# Patient Record
Sex: Male | Born: 1940
Health system: Southern US, Community
[De-identification: ages and names within clinical notes are randomized; demographics above are authoritative.]

## PROBLEM LIST (undated history)

## (undated) DIAGNOSIS — K624 Stenosis of anus and rectum: Secondary | ICD-10-CM

## (undated) DIAGNOSIS — E7800 Pure hypercholesterolemia, unspecified: Secondary | ICD-10-CM

## (undated) DIAGNOSIS — M1990 Unspecified osteoarthritis, unspecified site: Secondary | ICD-10-CM

## (undated) DIAGNOSIS — I219 Acute myocardial infarction, unspecified: Secondary | ICD-10-CM

## (undated) DIAGNOSIS — I10 Essential (primary) hypertension: Secondary | ICD-10-CM

## (undated) DIAGNOSIS — T8859XA Other complications of anesthesia, initial encounter: Secondary | ICD-10-CM

## (undated) HISTORY — PX: HERNIA REPAIR: SHX51

## (undated) HISTORY — DX: Stenosis of anus and rectum: K62.4

## (undated) HISTORY — DX: Other complications of anesthesia, initial encounter: T88.59XA

## (undated) HISTORY — PX: CORONARY ARTERY BYPASS GRAFT: SHX141

---

## 2003-08-01 ENCOUNTER — Other Ambulatory Visit: Admission: RE | Admit: 2003-08-01 | Discharge: 2003-08-01 | Payer: Self-pay | Admitting: Dermatology

## 2005-03-24 ENCOUNTER — Encounter (HOSPITAL_COMMUNITY): Admission: RE | Admit: 2005-03-24 | Discharge: 2005-04-23 | Payer: Self-pay | Admitting: Family Medicine

## 2006-10-18 DIAGNOSIS — K624 Stenosis of anus and rectum: Secondary | ICD-10-CM

## 2006-10-18 HISTORY — DX: Stenosis of anus and rectum: K62.4

## 2007-11-06 ENCOUNTER — Ambulatory Visit: Payer: Self-pay | Admitting: Orthopedic Surgery

## 2007-11-06 DIAGNOSIS — M23302 Other meniscus derangements, unspecified lateral meniscus, unspecified knee: Secondary | ICD-10-CM | POA: Insufficient documentation

## 2007-11-06 DIAGNOSIS — M1710 Unilateral primary osteoarthritis, unspecified knee: Secondary | ICD-10-CM

## 2007-11-06 DIAGNOSIS — IMO0002 Reserved for concepts with insufficient information to code with codable children: Secondary | ICD-10-CM | POA: Insufficient documentation

## 2009-07-24 ENCOUNTER — Ambulatory Visit (HOSPITAL_COMMUNITY): Admission: RE | Admit: 2009-07-24 | Discharge: 2009-07-24 | Payer: Self-pay | Admitting: Internal Medicine

## 2011-03-05 NOTE — Procedures (Signed)
NAMEVAUGHN, BEAUMIER            ACCOUNT NO.:  [id number]   MEDICAL RECORD NO.:  [id number]          PATIENT TYPE:  REC   LOCATION:                                FACILITY:  [facility]   PHYSICIAN:  [name] A. Gerda Diss, MD    DATE OF BIRTH:  13-Dec-1940   DATE OF PROCEDURE:  DATE OF DISCHARGE:                                    STRESS TEST   STRESS CARDIOLITE   INDICATIONS:  Coronary artery disease, DOE.   RESTING EKG:  No acute ST segment changes are noted.  No sign of ischemia on  resting EKG.   PROTOCOL:  Bruce protocol.   ST segment response during exercise:  Patient had a normal ST segment  response during exercise.  No significant deviations of elevation or  depression.   Arrhythmias:  None.   Blood pressure response to exercise:  Patient had mild hypertensive response  toward last phase of exercise.   Recovery phase uneventful.   Interpretation of test:  Normal stress portion.  Await Cardiolite images.       SAL/MEDQ  D:  03/24/2005  T:  03/24/2005  Job:  [id number]

## 2012-07-31 ENCOUNTER — Encounter (HOSPITAL_COMMUNITY): Payer: Self-pay | Admitting: Emergency Medicine

## 2012-07-31 ENCOUNTER — Emergency Department (HOSPITAL_COMMUNITY): Payer: Medicare Other

## 2012-07-31 ENCOUNTER — Emergency Department (HOSPITAL_COMMUNITY)
Admission: EM | Admit: 2012-07-31 | Discharge: 2012-07-31 | Disposition: A | Discharge status: Home / Self Care | Payer: Medicare Other | Attending: Emergency Medicine | Admitting: Emergency Medicine

## 2012-07-31 DIAGNOSIS — I10 Essential (primary) hypertension: Secondary | ICD-10-CM | POA: Insufficient documentation

## 2012-07-31 DIAGNOSIS — B349 Viral infection, unspecified: Secondary | ICD-10-CM

## 2012-07-31 DIAGNOSIS — R509 Fever, unspecified: Secondary | ICD-10-CM | POA: Insufficient documentation

## 2012-07-31 DIAGNOSIS — B9789 Other viral agents as the cause of diseases classified elsewhere: Secondary | ICD-10-CM | POA: Insufficient documentation

## 2012-07-31 DIAGNOSIS — M542 Cervicalgia: Secondary | ICD-10-CM | POA: Insufficient documentation

## 2012-07-31 DIAGNOSIS — R079 Chest pain, unspecified: Secondary | ICD-10-CM | POA: Insufficient documentation

## 2012-07-31 DIAGNOSIS — I252 Old myocardial infarction: Secondary | ICD-10-CM | POA: Insufficient documentation

## 2012-07-31 DIAGNOSIS — R51 Headache: Secondary | ICD-10-CM | POA: Insufficient documentation

## 2012-07-31 HISTORY — DX: Acute myocardial infarction, unspecified: I21.9

## 2012-07-31 HISTORY — DX: Unspecified osteoarthritis, unspecified site: M19.90

## 2012-07-31 HISTORY — DX: Essential (primary) hypertension: I10

## 2012-07-31 HISTORY — DX: Pure hypercholesterolemia, unspecified: E78.00

## 2012-07-31 LAB — CBC WITH DIFFERENTIAL/PLATELET
Basophils Relative: 0 % (ref 0–1)
Eosinophils Absolute: 0.3 10*3/uL (ref 0.0–0.7)
Eosinophils Relative: 3 % (ref 0–5)
HCT: 39.7 % (ref 39.0–52.0)
Lymphocytes Relative: 13 % (ref 12–46)
MCH: 27.5 pg (ref 26.0–34.0)
MCHC: 33 g/dL (ref 30.0–36.0)
MCV: 83.2 fL (ref 78.0–100.0)
RBC: 4.77 MIL/uL (ref 4.22–5.81)

## 2012-07-31 LAB — BASIC METABOLIC PANEL
CO2: 25 mEq/L (ref 19–32)
Calcium: 9.3 mg/dL (ref 8.4–10.5)
Creatinine, Ser: 0.61 mg/dL (ref 0.50–1.35)
GFR calc Af Amer: >90 mL/min (ref >90)
Potassium: 3.6 mEq/L (ref 3.5–5.1)

## 2012-07-31 LAB — HEPATIC FUNCTION PANEL
AST: 23 U/L (ref 0–37)
Alkaline Phosphatase: 68 U/L (ref 39–117)
Indirect Bilirubin: 0.3 mg/dL (ref 0.3–0.9)
Total Protein: 7.7 g/dL (ref 6.0–8.3)

## 2012-07-31 MED ORDER — DOXYCYCLINE HYCLATE 100 MG PO CAPS: 100.0000 mg | ORAL_CAPSULE | Freq: Two times a day (BID) | ORAL | Status: DC

## 2012-07-31 MED ORDER — KETOROLAC TROMETHAMINE 30 MG/ML IJ SOLN
30.0000 mg | Freq: Once | INTRAMUSCULAR | Status: AC
  Administered 2012-07-31: 30 mg via INTRAVENOUS
  Filled 2012-07-31: qty 1

## 2012-07-31 MED ORDER — SODIUM CHLORIDE 0.9 % IV BOLUS (SEPSIS)
1000.0000 mL | Freq: Once | INTRAVENOUS | Status: AC
  Administered 2012-07-31: 1000 mL via INTRAVENOUS

## 2012-07-31 NOTE — ED Notes (Signed)
Pt c/o headache all day yesterday, fever, body aches and neck pain started last night. Denies neck feeling stiff but painful when he bends it. Denies n/v/d/cough/sob.

## 2012-07-31 NOTE — ED Provider Notes (Signed)
History   This chart was scribed for Benny Lennert, MD by Sofie Rower. The patient was seen in room APA10/APA10 and the patient's care was started at 5:16PM.     CSN: [id number]  Arrival date & time 07/31/12  1634   First MD Initiated Contact with Patient 07/31/12 1716      Chief Complaint  Patient presents with  . Headache  . Neck Pain  . Fever    (Consider location/radiation/quality/duration/timing/severity/associated sxs/prior treatment) Patient is a 71 y.o. male presenting with headaches and neck pain. The history is provided by the patient. No language interpreter was used.  Headache  This is a new problem. The current episode started yesterday. The problem occurs constantly. The problem has been gradually worsening. The headache is associated with an unknown factor. The pain is moderate. The pain does not radiate. He has tried nothing for the symptoms. The treatment provided no relief.  Neck Pain  This is a new problem. The current episode started yesterday. The problem occurs constantly. The problem has not changed since onset.The pain is associated with an unknown factor. There has been no fever. The pain is present in the generalized neck. The pain is mild. The symptoms are aggravated by bending. The pain is the same all the time. He has tried nothing for the symptoms. The treatment provided no relief.    Past Medical History  Diagnosis Date  . Myocardial infarct   . Hypercholesteremia   . Hypertension   . Arthritis     Past Surgical History  Procedure Date  . Coronary artery bypass graft   . Hernia repair     History reviewed. No pertinent family history.  History  Substance Use Topics  . Smoking status: Former Games developer  . Smokeless tobacco: Not on file  . Alcohol Use: No      Review of Systems  HENT: Positive for neck pain.   All other systems reviewed and are negative.    Allergies  Review of patient's allergies indicates no known allergies.  Home  Medications  No current outpatient prescriptions on file.  BP 172/66  Pulse 73  Temp 98.6 F (37 C) (Oral)  Resp 20  Ht 5\' 8"  (1.727 m)  Wt 146 lb (66.225 kg)  BMI 22.20 kg/m2  SpO2 98%  Physical Exam  Nursing note and vitals reviewed. Constitutional: He is oriented to person, place, and time. He appears well-developed.  HENT:  Head: Normocephalic and atraumatic.  Eyes: Conjunctivae normal and EOM are normal. No scleral icterus.  Neck: Neck supple. No thyromegaly present.  Cardiovascular: Normal rate and regular rhythm.  Exam reveals no gallop and no friction rub.   No murmur heard. Pulmonary/Chest: No stridor. He has no wheezes. He has no rales. He exhibits no tenderness.  Abdominal: He exhibits no distension. There is no tenderness. There is no rebound.  Musculoskeletal: Normal range of motion. He exhibits no edema.  Lymphadenopathy:    He has no cervical adenopathy.  Neurological: He is oriented to person, place, and time. Coordination normal.  Skin: No rash noted. No erythema.  Psychiatric: He has a normal mood and affect. His behavior is normal.  neck supple with minimal tenderness  ED Course  Procedures (including critical care time)  DIAGNOSTIC STUDIES: Oxygen Saturation is 98% on room air, normal by my interpretation.    COORDINATION OF CARE:    5:20PM- Treatment plan concerning Blood work, IV fluids, and X-ray discussed with patient. Pt agrees with treatment.  Results for orders placed during the hospital encounter of 07/31/12  CBC WITH DIFFERENTIAL      Component Value Range   WBC 12.1 (*) 4.0 - 10.5 K/uL   RBC 4.77  4.22 - 5.81 MIL/uL   Hemoglobin 13.1  13.0 - 17.0 g/dL   HCT 16.1  09.6 - 04.5 %   MCV 83.2  78.0 - 100.0 fL   MCH 27.5  26.0 - 34.0 pg   MCHC 33.0  30.0 - 36.0 g/dL   RDW 40.9  81.1 - 91.4 %   Platelets 259  150 - 400 K/uL   Neutrophils Relative 78 (*) 43 - 77 %   Neutro Abs 9.4 (*) 1.7 - 7.7 K/uL   Lymphocytes Relative 13  12 - 46 %     Lymphs Abs 1.6  0.7 - 4.0 K/uL   Monocytes Relative 6  3 - 12 %   Monocytes Absolute 0.7  0.1 - 1.0 K/uL   Eosinophils Relative 3  0 - 5 %   Eosinophils Absolute 0.3  0.0 - 0.7 K/uL   Basophils Relative 0  0 - 1 %   Basophils Absolute 0.1  0.0 - 0.1 K/uL  BASIC METABOLIC PANEL      Component Value Range   Sodium 133 (*) 135 - 145 mEq/L   Potassium 3.6  3.5 - 5.1 mEq/L   Chloride 97  96 - 112 mEq/L   CO2 25  19 - 32 mEq/L   Glucose, Bld 111 (*) 70 - 99 mg/dL   BUN 7  6 - 23 mg/dL   Creatinine, Ser 7.82  0.50 - 1.35 mg/dL   Calcium 9.3  8.4 - 95.6 mg/dL   GFR calc non Af Amer >90  >90 mL/min   GFR calc Af Amer >90  >90 mL/min  HEPATIC FUNCTION PANEL      Component Value Range   Total Protein 7.7  6.0 - 8.3 g/dL   Albumin 3.7  3.5 - 5.2 g/dL   AST 23  0 - 37 U/L   ALT 17  0 - 53 U/L   Alkaline Phosphatase 68  39 - 117 U/L   Total Bilirubin 0.4  0.3 - 1.2 mg/dL   Bilirubin, Direct 0.1  0.0 - 0.3 mg/dL   Indirect Bilirubin 0.3  0.3 - 0.9 mg/dL   Dg Chest 2 View  21/30/8657  *RADIOLOGY REPORT*  Clinical Data: Chest pain and neck pain.  Headache.  CHEST - 2 VIEW  Comparison: None.  Findings: Prior CABG.  Heart size and vascularity are normal. Lungs are hyperinflated with accentuation of the interstitial markings at the bases which is probably chronic.  No effusions.  No acute osseous abnormality.  The lateral view there is a vague density in the region of the medial segment right middle lobe adjacent to some surgical clips. I suspect this is an area of scarring.  IMPRESSION: COPD.  No acute abnormality.   Original Report Authenticated By: Gwynn Burly, M.D.    Ct Head Wo Contrast  07/31/2012  *RADIOLOGY REPORT*  Clinical Data: Headache.  Neck pain.  Fever.  CT HEAD WITHOUT CONTRAST  Technique:  Contiguous axial images were obtained from the base of the skull through the vertex without contrast.  Comparison: None.  Findings: There is no intra or extra-axial fluid collection or mass  lesion.  The basilar cisterns and ventricles have a normal appearance.  There is no CT evidence for acute infarction or hemorrhage.  Bone windows  show mucoperiosteal thickening in the ethmoid air cells.  No calvarial fracture.  IMPRESSION:  1. No evidence for acute intracranial abnormality. 2.  Changes of chronic ethmoid sinusitis.   Original Report Authenticated By: Patterson Hammersmith, M.D.       No diagnosis found.   Pt felt much better at discharge after fluids and toradol MDM  Pt with headache and supple neck.  No fever.  Possible viral infection.  Will cover for rocky [place] spotted fever      The chart was scribed for me under my direct supervision.  I personally performed the history, physical, and medical decision making and all procedures in the evaluation of this patient.Benny Lennert, MD 07/31/12 [phone]

## 2012-08-01 ENCOUNTER — Emergency Department (HOSPITAL_COMMUNITY)
Admission: EM | Admit: 2012-08-01 | Discharge: 2012-08-01 | Disposition: A | Discharge status: Home / Self Care | Payer: Medicare Other | Attending: Emergency Medicine | Admitting: Emergency Medicine

## 2012-08-01 ENCOUNTER — Encounter (HOSPITAL_COMMUNITY): Payer: Self-pay | Admitting: Emergency Medicine

## 2012-08-01 DIAGNOSIS — B349 Viral infection, unspecified: Secondary | ICD-10-CM

## 2012-08-01 DIAGNOSIS — R509 Fever, unspecified: Secondary | ICD-10-CM | POA: Insufficient documentation

## 2012-08-01 DIAGNOSIS — R51 Headache: Secondary | ICD-10-CM | POA: Insufficient documentation

## 2012-08-01 DIAGNOSIS — Z951 Presence of aortocoronary bypass graft: Secondary | ICD-10-CM | POA: Insufficient documentation

## 2012-08-01 DIAGNOSIS — M129 Arthropathy, unspecified: Secondary | ICD-10-CM | POA: Insufficient documentation

## 2012-08-01 DIAGNOSIS — I252 Old myocardial infarction: Secondary | ICD-10-CM | POA: Insufficient documentation

## 2012-08-01 DIAGNOSIS — Z7982 Long term (current) use of aspirin: Secondary | ICD-10-CM | POA: Insufficient documentation

## 2012-08-01 DIAGNOSIS — I10 Essential (primary) hypertension: Secondary | ICD-10-CM | POA: Insufficient documentation

## 2012-08-01 DIAGNOSIS — M549 Dorsalgia, unspecified: Secondary | ICD-10-CM | POA: Insufficient documentation

## 2012-08-01 DIAGNOSIS — E7800 Pure hypercholesterolemia, unspecified: Secondary | ICD-10-CM | POA: Insufficient documentation

## 2012-08-01 DIAGNOSIS — Z79899 Other long term (current) drug therapy: Secondary | ICD-10-CM | POA: Insufficient documentation

## 2012-08-01 LAB — CBC WITH DIFFERENTIAL/PLATELET
Eosinophils Relative: 2 % (ref 0–5)
HCT: 37.8 % — ABNORMAL LOW (ref 39.0–52.0)
Hemoglobin: 12.4 g/dL — ABNORMAL LOW (ref 13.0–17.0)
Lymphs Abs: 1 10*3/uL (ref 0.7–4.0)
MCV: 82.5 fL (ref 78.0–100.0)
Monocytes Relative: 6 % (ref 3–12)
WBC: 9 10*3/uL (ref 4.0–10.5)

## 2012-08-01 LAB — COMPREHENSIVE METABOLIC PANEL
Albumin: 3.4 g/dL — ABNORMAL LOW (ref 3.5–5.2)
BUN: 9 mg/dL (ref 6–23)
GFR calc Af Amer: >90 mL/min (ref >90)
GFR calc non Af Amer: >90 mL/min (ref >90)
Potassium: 3.6 mEq/L (ref 3.5–5.1)
Sodium: 133 mEq/L — ABNORMAL LOW (ref 135–145)
Total Protein: 7.1 g/dL (ref 6.0–8.3)

## 2012-08-01 LAB — ROCKY MTN SPOTTED FVR AB, IGG-BLOOD: RMSF IgG: 0.09 IV

## 2012-08-01 LAB — URINALYSIS, ROUTINE W REFLEX MICROSCOPIC
Glucose, UA: NEGATIVE mg/dL
Hgb urine dipstick: NEGATIVE
Leukocytes, UA: NEGATIVE
Protein, ur: NEGATIVE mg/dL
pH: 7 (ref 5.0–8.0)

## 2012-08-01 MED ORDER — SODIUM CHLORIDE 0.9 % IV BOLUS (SEPSIS)
1000.0000 mL | Freq: Once | INTRAVENOUS | Status: AC
  Administered 2012-08-01 (×2): 1000 mL via INTRAVENOUS

## 2012-08-01 MED ORDER — DOXYCYCLINE HYCLATE 100 MG PO TABS
100.0000 mg | ORAL_TABLET | Freq: Once | ORAL | Status: AC
  Administered 2012-08-01: 100 mg via ORAL
  Filled 2012-08-01: qty 1

## 2012-08-01 MED ORDER — SODIUM CHLORIDE 0.9 % IV BOLUS (SEPSIS): 1000.0000 mL | Freq: Once | INTRAVENOUS | Status: DC

## 2012-08-01 MED ORDER — DIPHENHYDRAMINE HCL 50 MG/ML IJ SOLN
12.5000 mg | Freq: Once | INTRAMUSCULAR | Status: AC
  Administered 2012-08-01: 12.5 mg via INTRAVENOUS
  Filled 2012-08-01: qty 1

## 2012-08-01 MED ORDER — METOCLOPRAMIDE HCL 5 MG/ML IJ SOLN
10.0000 mg | Freq: Once | INTRAMUSCULAR | Status: AC
  Administered 2012-08-01: 10 mg via INTRAVENOUS
  Filled 2012-08-01: qty 2

## 2012-08-01 MED ORDER — KETOROLAC TROMETHAMINE 30 MG/ML IJ SOLN
30.0000 mg | Freq: Once | INTRAMUSCULAR | Status: AC
  Administered 2012-08-01: 30 mg via INTRAVENOUS
  Filled 2012-08-01: qty 1

## 2012-08-01 NOTE — ED Notes (Addendum)
Pt states he was seen here last night and that he feels worse. States he has a headache, generalized body aches, and pain all over that has gotten worse since last night.

## 2012-08-01 NOTE — ED Notes (Signed)
Pt c/o headache, fever, generalized body aches.  Reports went to PCP yesterday and was sent here for evaluation.  Reports was given doxycyline prescription but hasn't started taking it yet.  Pt says woke up this morning around 2am feeling worse.  Reports nausea, no vomiting.  Pt says has a little bit on neck pain at the base of his skull.  Pt alert and oriented.

## 2012-08-01 NOTE — ED Provider Notes (Signed)
History  This chart was scribed for Donnetta Hutching, MD by Ladona Ridgel Day. This patient was seen in room APA18/APA18 and the patient's care was started at 0722.   CSN: [id number]  Arrival date & time 08/01/12  0630   First MD Initiated Contact with Patient 08/01/12 [phone]      Chief Complaint  Patient presents with  . Headache  . Generalized Body Aches   Patient is a 71 y.o. male presenting with headaches. The history is provided by the patient. No language interpreter was used.  Headache  This is a recurrent problem. The current episode started 2 days ago. The problem occurs constantly. The problem has been gradually worsening. The headache is associated with nothing. The pain is located in the occipital and frontal region. The quality of the pain is described as throbbing. The pain is moderate. The pain does not radiate. Associated symptoms include a fever. Pertinent negatives include no shortness of breath, no nausea and no vomiting.   Jason Spence is a 71 y.o. male who presents to the Emergency Department complaining of constant gradually worsening HA and generalized body aches/fever for the past 2 days. He was seen here yesterday after his PCP Dr. Rich Reining told him to come to ER yesterday for evaluation. He has normal head CT and CXR and D/C with doxycycline. He states has been having generalized body aches, frontal/occipital HA, fever, and lower back pain. He states Dr. Rich Reining may have been worried at meningitis because his neck was sore. He denies any modifying factors to his symptoms.   Past Medical History  Diagnosis Date  . Myocardial infarct   . Hypercholesteremia   . Hypertension   . Arthritis     Past Surgical History  Procedure Date  . Coronary artery bypass graft   . Hernia repair     History reviewed. No pertinent family history.  History  Substance Use Topics  . Smoking status: Former Games developer  . Smokeless tobacco: Not on file  . Alcohol Use: No      Review of  Systems  Constitutional: Positive for fever. Negative for chills.  HENT: Negative for congestion.   Respiratory: Negative for shortness of breath.   Gastrointestinal: Negative for nausea, vomiting, abdominal pain and diarrhea.  Musculoskeletal: Positive for back pain.       Lower back pain  Neurological: Positive for headaches. Negative for weakness.  All other systems reviewed and are negative.    Allergies  Review of patient's allergies indicates no known allergies.  Home Medications   Current Outpatient Rx  Name Route Sig Dispense Refill  . ASPIRIN 81 MG PO TABS Oral Take 81 mg by mouth daily.    Marland Kitchen CEFPROZIL 500 MG PO TABS Oral Take 500 mg by mouth 2 (two) times daily.    Marland Kitchen FEXOFENADINE HCL 30 MG PO TABS Oral Take 30 mg by mouth 2 (two) times daily.    . OMEGA-3 FATTY ACIDS 1000 MG PO CAPS Oral Take 2 g by mouth daily.    Marland Kitchen FLUTICASONE PROPIONATE 50 MCG/ACT NA SUSP Nasal Place 2 sprays into the nose daily.    Marland Kitchen OMEPRAZOLE 20 MG PO CPDR Oral Take 20 mg by mouth daily.    Marland Kitchen PRAVASTATIN SODIUM 40 MG PO TABS Oral Take 40 mg by mouth daily.    Marland Kitchen RAMIPRIL 10 MG PO TABS Oral Take 10 mg by mouth daily.    Marland Kitchen DOXYCYCLINE HYCLATE 100 MG PO CAPS Oral Take 1 capsule (100 mg total)  by mouth 2 (two) times daily. 20 capsule 0    Triage Vitals: BP 149/61  Pulse 64  Temp 98.3 F (36.8 C) (Oral)  Resp 20  Ht 5\' 9"  (1.753 m)  Wt 146 lb (66.225 kg)  BMI 21.56 kg/m2  SpO2 100%  Physical Exam  Nursing note and vitals reviewed. Constitutional: He is oriented to person, place, and time. He appears well-developed and well-nourished.       Pt is alert and non-toxic appearing  HENT:  Head: Normocephalic and atraumatic.       Dry mucous membranes  Eyes: Conjunctivae normal and EOM are normal. Pupils are equal, round, and reactive to light.  Neck: Normal range of motion. Neck supple.       No meningeal signs.   Cardiovascular: Normal rate, regular rhythm and normal heart sounds.     Pulmonary/Chest: Effort normal and breath sounds normal.  Abdominal: Soft. Bowel sounds are normal. He exhibits no distension. There is no tenderness. There is no rebound and no guarding.  Musculoskeletal: Normal range of motion. He exhibits tenderness.       Lower lumbar spine tenderness  Neurological: He is alert and oriented to person, place, and time.  Skin: Skin is warm and dry.  Psychiatric: He has a normal mood and affect.    ED Course  Procedures (including critical care time) DIAGNOSTIC STUDIES: Oxygen Saturation is 100% on room air, normal by my interpretation.    COORDINATION OF CARE: At 730 AM Discussed treatment plan with patient which includes IV fluids, blood work, UA. Patient agrees.   Labs Reviewed - No data to display Dg Chest 2 View  07/31/2012  *RADIOLOGY REPORT*  Clinical Data: Chest pain and neck pain.  Headache.  CHEST - 2 VIEW  Comparison: None.  Findings: Prior CABG.  Heart size and vascularity are normal. Lungs are hyperinflated with accentuation of the interstitial markings at the bases which is probably chronic.  No effusions.  No acute osseous abnormality.  The lateral view there is a vague density in the region of the medial segment right middle lobe adjacent to some surgical clips. I suspect this is an area of scarring.  IMPRESSION: COPD.  No acute abnormality.   Original Report Authenticated By: Gwynn Burly, M.D.    Ct Head Wo Contrast  07/31/2012  *RADIOLOGY REPORT*  Clinical Data: Headache.  Neck pain.  Fever.  CT HEAD WITHOUT CONTRAST  Technique:  Contiguous axial images were obtained from the base of the skull through the vertex without contrast.  Comparison: None.  Findings: There is no intra or extra-axial fluid collection or mass lesion.  The basilar cisterns and ventricles have a normal appearance.  There is no CT evidence for acute infarction or hemorrhage.  Bone windows show mucoperiosteal thickening in the ethmoid air cells.  No calvarial  fracture.  IMPRESSION:  1. No evidence for acute intracranial abnormality. 2.  Changes of chronic ethmoid sinusitis.   Original Report Authenticated By: Patterson Hammersmith, M.D.    Results for orders placed during the hospital encounter of 08/01/12  CBC WITH DIFFERENTIAL      Component Value Range   WBC 9.0  4.0 - 10.5 K/uL   RBC 4.58  4.22 - 5.81 MIL/uL   Hemoglobin 12.4 (*) 13.0 - 17.0 g/dL   HCT 96.0 (*) 45.4 - 09.8 %   MCV 82.5  78.0 - 100.0 fL   MCH 27.1  26.0 - 34.0 pg   MCHC 32.8  30.0 -  36.0 g/dL   RDW 16.1  09.6 - 04.5 %   Platelets 223  150 - 400 K/uL   Neutrophils Relative 81 (*) 43 - 77 %   Neutro Abs 7.3  1.7 - 7.7 K/uL   Lymphocytes Relative 11 (*) 12 - 46 %   Lymphs Abs 1.0  0.7 - 4.0 K/uL   Monocytes Relative 6  3 - 12 %   Monocytes Absolute 0.5  0.1 - 1.0 K/uL   Eosinophils Relative 2  0 - 5 %   Eosinophils Absolute 0.2  0.0 - 0.7 K/uL   Basophils Relative 0  0 - 1 %   Basophils Absolute 0.0  0.0 - 0.1 K/uL  COMPREHENSIVE METABOLIC PANEL      Component Value Range   Sodium 133 (*) 135 - 145 mEq/L   Potassium 3.6  3.5 - 5.1 mEq/L   Chloride 100  96 - 112 mEq/L   CO2 23  19 - 32 mEq/L   Glucose, Bld 112 (*) 70 - 99 mg/dL   BUN 9  6 - 23 mg/dL   Creatinine, Ser 4.09  0.50 - 1.35 mg/dL   Calcium 8.9  8.4 - 81.1 mg/dL   Total Protein 7.1  6.0 - 8.3 g/dL   Albumin 3.4 (*) 3.5 - 5.2 g/dL   AST 21  0 - 37 U/L   ALT 14  0 - 53 U/L   Alkaline Phosphatase 60  39 - 117 U/L   Total Bilirubin 0.5  0.3 - 1.2 mg/dL   GFR calc non Af Amer >90  >90 mL/min   GFR calc Af Amer >90  >90 mL/min  URINALYSIS, ROUTINE W REFLEX MICROSCOPIC      Component Value Range   Color, Urine YELLOW  YELLOW   APPearance CLEAR  CLEAR   Specific Gravity, Urine 1.020  1.005 - 1.030   pH 7.0  5.0 - 8.0   Glucose, UA NEGATIVE  NEGATIVE mg/dL   Hgb urine dipstick NEGATIVE  NEGATIVE   Bilirubin Urine NEGATIVE  NEGATIVE   Ketones, ur 15 (*) NEGATIVE mg/dL   Protein, ur NEGATIVE  NEGATIVE mg/dL     Urobilinogen, UA 0.2  0.0 - 1.0 mg/dL   Nitrite NEGATIVE  NEGATIVE   Leukocytes, UA NEGATIVE  NEGATIVE    No diagnosis found.    MDM   Patient feeling much better after IV fluids, IV Benadryl, Reglan, Toradol. No clinical evidence of meningitis.  White blood cell count 9.0. Urinalysis negative.      I personally performed the services described in this documentation, which was scribed in my presence. The recorded information has been reviewed and considered.          Donnetta Hutching, MD 08/15/12 2202

## 2012-08-02 ENCOUNTER — Other Ambulatory Visit: Payer: Self-pay

## 2012-08-02 ENCOUNTER — Observation Stay (HOSPITAL_COMMUNITY)
Admission: EM | Admit: 2012-08-02 | Discharge: 2012-08-05 | Disposition: A | Discharge status: Home / Self Care | Payer: Medicare Other | Attending: Internal Medicine | Admitting: Internal Medicine

## 2012-08-02 ENCOUNTER — Encounter (HOSPITAL_COMMUNITY): Payer: Self-pay | Admitting: *Deleted

## 2012-08-02 ENCOUNTER — Emergency Department (HOSPITAL_COMMUNITY): Payer: Medicare Other

## 2012-08-02 DIAGNOSIS — R51 Headache: Secondary | ICD-10-CM | POA: Insufficient documentation

## 2012-08-02 DIAGNOSIS — R509 Fever, unspecified: Secondary | ICD-10-CM | POA: Diagnosis present

## 2012-08-02 DIAGNOSIS — K219 Gastro-esophageal reflux disease without esophagitis: Secondary | ICD-10-CM | POA: Insufficient documentation

## 2012-08-02 DIAGNOSIS — M549 Dorsalgia, unspecified: Secondary | ICD-10-CM

## 2012-08-02 DIAGNOSIS — I2510 Atherosclerotic heart disease of native coronary artery without angina pectoris: Secondary | ICD-10-CM | POA: Insufficient documentation

## 2012-08-02 DIAGNOSIS — Z79899 Other long term (current) drug therapy: Secondary | ICD-10-CM | POA: Insufficient documentation

## 2012-08-02 DIAGNOSIS — E785 Hyperlipidemia, unspecified: Secondary | ICD-10-CM | POA: Insufficient documentation

## 2012-08-02 DIAGNOSIS — R291 Meningismus: Principal | ICD-10-CM | POA: Diagnosis present

## 2012-08-02 DIAGNOSIS — R519 Headache, unspecified: Secondary | ICD-10-CM | POA: Diagnosis present

## 2012-08-02 LAB — COMPREHENSIVE METABOLIC PANEL
Alkaline Phosphatase: 62 U/L (ref 39–117)
BUN: 7 mg/dL (ref 6–23)
Calcium: 9 mg/dL (ref 8.4–10.5)
Creatinine, Ser: 0.62 mg/dL (ref 0.50–1.35)
GFR calc Af Amer: >90 mL/min (ref >90)
Glucose, Bld: 112 mg/dL — ABNORMAL HIGH (ref 70–99)
Total Protein: 7.5 g/dL (ref 6.0–8.3)

## 2012-08-02 LAB — CBC WITH DIFFERENTIAL/PLATELET
Eosinophils Absolute: 0.1 10*3/uL (ref 0.0–0.7)
Eosinophils Relative: 1 % (ref 0–5)
HCT: 40.3 % (ref 39.0–52.0)
Lymphocytes Relative: 12 % (ref 12–46)
Monocytes Relative: 8 % (ref 3–12)
Neutro Abs: 7 10*3/uL (ref 1.7–7.7)
Neutrophils Relative %: 79 % — ABNORMAL HIGH (ref 43–77)
Platelets: 223 10*3/uL (ref 150–400)
RDW: 14.3 % (ref 11.5–15.5)
WBC: 8.9 10*3/uL (ref 4.0–10.5)

## 2012-08-02 LAB — URINALYSIS, ROUTINE W REFLEX MICROSCOPIC
Bilirubin Urine: NEGATIVE
Nitrite: NEGATIVE
Specific Gravity, Urine: 1.02 (ref 1.005–1.030)
Urobilinogen, UA: 0.2 mg/dL (ref 0.0–1.0)

## 2012-08-02 LAB — TROPONIN I: Troponin I: <0.3 ng/mL (ref <0.30)

## 2012-08-02 MED ORDER — MORPHINE SULFATE 4 MG/ML IJ SOLN
4.0000 mg | Freq: Once | INTRAMUSCULAR | Status: AC
  Administered 2012-08-02: 4 mg via INTRAVENOUS
  Filled 2012-08-02: qty 1

## 2012-08-02 MED ORDER — SODIUM CHLORIDE 0.9 % IV SOLN: INTRAVENOUS | Status: DC

## 2012-08-02 MED ORDER — ONDANSETRON HCL 4 MG/2ML IJ SOLN
4.0000 mg | Freq: Once | INTRAMUSCULAR | Status: AC
  Administered 2012-08-02: 4 mg via INTRAVENOUS
  Filled 2012-08-02: qty 2

## 2012-08-02 MED ORDER — SODIUM CHLORIDE 0.9 % IV BOLUS (SEPSIS)
1000.0000 mL | Freq: Once | INTRAVENOUS | Status: AC
  Administered 2012-08-02: 1000 mL via INTRAVENOUS

## 2012-08-02 NOTE — ED Notes (Signed)
Consent signed for Lumbar puncture.

## 2012-08-02 NOTE — ED Provider Notes (Addendum)
History     CSN: [id number]  Arrival date & time 08/02/12  1643   First MD Initiated Contact with Patient 08/02/12 1743      Chief Complaint  Patient presents with  . Headache    (Consider location/radiation/quality/duration/timing/severity/associated sxs/prior treatment) HPI Pt presents from PMD's office for ongoing HA since Sunday. Gradual onset. Associated with diffuse myalgias, low back pain, low grade fever and chills. Has been seen x2 at [facility] ED with Ct head that showed chronic ethmoid sinusitis. Pt started on Doxycycline for possible RMSF though reports no history of tick bites, rash. +nausea and photophobia. No focal weakness, sensory changes. Pt reports having flu shot last week and being treated for sinusitis 2 weeks ago. Past Medical History  Diagnosis Date  . Myocardial infarct     History reviewed. No pertinent past surgical history.  No family history on file.  History  Substance Use Topics  . Smoking status: Not on file  . Smokeless tobacco: Not on file  . Alcohol Use: No      Review of Systems  Constitutional: Positive for fever, chills, appetite change and fatigue.  HENT: Negative for congestion, sore throat, rhinorrhea, neck pain, neck stiffness and sinus pressure.   Eyes: Positive for photophobia. Negative for visual disturbance.  Respiratory: Negative for shortness of breath.   Cardiovascular: Negative for chest pain, palpitations and leg swelling.  Gastrointestinal: Positive for nausea. Negative for vomiting, abdominal pain, diarrhea and constipation.  Genitourinary: Negative for dysuria and frequency.  Musculoskeletal: Positive for myalgias, back pain and arthralgias.  Skin: Negative for pallor, rash and wound.  Neurological: Positive for tremors and headaches. Negative for dizziness, seizures, syncope, weakness, light-headedness and numbness.    Allergies  Review of patient's allergies indicates no known allergies.  Home Medications    Current Outpatient Rx  Name Route Sig Dispense Refill  . CEFPROZIL 500 MG PO TABS Oral Take 500 mg by mouth 2 (two) times daily.    Marland Kitchen FLUTICASONE PROPIONATE 50 MCG/ACT NA SUSP Nasal Place 2 sprays into the nose daily.    Marland Kitchen NIACIN ER (ANTIHYPERLIPIDEMIC) 1000 MG PO TBCR Oral Take 1,000 mg by mouth at bedtime.    . OMEPRAZOLE 20 MG PO CPDR Oral Take 20 mg by mouth daily.    Marland Kitchen PRAVASTATIN SODIUM 40 MG PO TABS Oral Take 40 mg by mouth daily.    Marland Kitchen RAMIPRIL 10 MG PO CAPS Oral Take 10 mg by mouth daily.      BP 132/54  Pulse 62  Temp 97.6 F (36.4 C) (Oral)  Resp 14  SpO2 94%  Physical Exam  Nursing note and vitals reviewed. Constitutional: He is oriented to person, place, and time. He appears well-developed and well-nourished.       Pt appears uncomfortable   HENT:  Head: Normocephalic and atraumatic.  Mouth/Throat: Oropharynx is clear and moist. No oropharyngeal exudate.       No sinus tenderness to percussion  Eyes: EOM are normal. Pupils are equal, round, and reactive to light.  Neck: Normal range of motion. Neck supple.       No posterior tenderness, FROM, no meningismus   Cardiovascular: Normal rate and regular rhythm.   Pulmonary/Chest: Effort normal and breath sounds normal. No respiratory distress. He has no wheezes. He has no rales.  Abdominal: Soft. Bowel sounds are normal. He exhibits no distension and no mass. There is no tenderness. There is no rebound and no guarding.  Musculoskeletal: Normal range of motion. He exhibits  tenderness (generalized musular ttp, no spinal ttp, negative straight leg raise). He exhibits no edema.  Neurological: He is alert and oriented to person, place, and time.       5/5 motor in all ext, sensation intact, CN II-Xii intact. Finger to nose intact. Tremor bl hand noted  Skin: Skin is warm and dry. No rash noted. No erythema. No pallor.  Psychiatric: He has a normal mood and affect. His behavior is normal.    ED Course  LUMBAR  PUNCTURE Date/Time: 08/02/2012 10:49 PM Performed by: Loren Racer Authorized by: Ranae Palms, Querida Beretta Consent: Written consent obtained. Risks and benefits: risks, benefits and alternatives were discussed Consent given by: patient Site marked: the operative site was marked Time out: Immediately prior to procedure a "time out" was called to verify the correct patient, procedure, equipment, support staff and site/side marked as required. Indications: evaluation for infection Anesthesia: local infiltration Local anesthetic: lidocaine 1% without epinephrine Anesthetic total: 2 ml Patient sedated: no Preparation: Patient was prepped and draped in the usual sterile fashion. Lumbar space: L3-L4 interspace Patient's position: left lateral decubitus Needle type: spinal needle - Quincke tip Number of attempts: 5 or more Post-procedure: site cleaned and adhesive bandage applied Patient tolerance: Patient tolerated the procedure well with no immediate complications. Comments: Unsuccessful attempt. Pt tolerated well. No immediate complications   (including critical care time)  Labs Reviewed  CBC WITH DIFFERENTIAL - Abnormal; Notable for the following:    Neutrophils Relative 79 (*)     All other components within normal limits  COMPREHENSIVE METABOLIC PANEL - Abnormal; Notable for the following:    Glucose, Bld 112 (*)     All other components within normal limits  URINALYSIS, ROUTINE W REFLEX MICROSCOPIC - Abnormal; Notable for the following:    Ketones, ur >80 (*)     All other components within normal limits  TROPONIN I  INFLUENZA PANEL BY PCR  CSF CELL COUNT WITH DIFFERENTIAL  GLUCOSE, CSF  PROTEIN, CSF  HERPES SIMPLEX VIRUS(HSV) DNA BY PCR  CRYPTOCOCCAL ANTIGEN, CSF  WEST NILE VIRUS AB, IGG AND IGM  ENTEROVIRUS PCR   Ct Head Wo Contrast  08/02/2012  *RADIOLOGY REPORT*  Clinical Data: Severe headache.  CT HEAD WITHOUT CONTRAST  Technique:  Contiguous axial images were obtained  from the base of the skull through the vertex without contrast.  Comparison: None.  Findings: No acute infarct, hemorrhage, mass lesion is present. The ventricles are of normal size.  No significant extra-axial fluid collection is present.  Minimal subcortical white matter disease is likely within normal limits for age.  Mild mucosal thickening is present in the ethmoid air cells.  The paranasal sinuses and mastoid air cells are otherwise clear.  IMPRESSION:  1.  Normal CT appearance of the brain for age. 2.  Minimal ethmoid sinus disease.   Original Report Authenticated By: Jamesetta Orleans. MATTERN, M.D.      1. Headache   2. Back pain     .  MDM  Discussed with Dr Daiva Eves who advised LP and multiple CSF tests. Stated if normal CSF may need MRI lumbar spine to r/o discitis. Attemptted LP without success in ED. Discussed wth Radiology who will get LP under fluoro guidance in AM. [facility] to admit. Pt symptoms improved with narcotics. Does not appear toxic.         Loren Racer, MD 08/02/12 0981  Loren Racer, MD 08/02/12 [phone]

## 2012-08-02 NOTE — ED Notes (Signed)
Patient unable to void at this time

## 2012-08-02 NOTE — ED Notes (Signed)
The pt has had a  Headache and lower back pain since Sunday and he has  Been in and out of the ed at [facility].  He is c/o being cold and extreme  Lower back pain.  He is currently c/o chills.

## 2012-08-02 NOTE — ED Notes (Signed)
EDP in room at this time. Pt c/o HA in back of head since Sunday with back pain. Family at bedside.

## 2012-08-02 NOTE — H&P (Signed)
PCP:   Lilyan Punt, MD  [facility]   Chief Complaint:   Headache  HPI: Jason Spence is a 71 y.o. male   has a past medical history of Myocardial infarct.   Presented with  2 in and an and in and an and and the or Wo her is 71 and an and weeks ago he have had a sinus infection that got better on antibiotics. Then 4 days he started ot have headaches and lumbar pain he proceeded to have achyness all over. He have had low grade fevers. He presented to his PCP and was sent to [facility] and patient was seen there. He was started on doxycycline empirically. HE came home and pain was so sever he went to ER yesterday his pain was difficult to control. He went to PCP this AM and was sent to [facility] to rule out meningitis. He has been slightly confused. And the pain has been severe. He was checked for flu but was not told that it was positive. He has never had headache like this before. The pain is like a tight band worse with cough and straining. He has been stumbling. He get no sleep due to the pain.  OF note an LP was attempted by ER but unsuccessfully. This case has also been discussed with Dr. Daiva Eves with ID who will see patient in AM.  Review of Systems:    Pertinent positives include:Fevers, chills, fatigue, head ache, back pain. He has had nausea but not vomiting.   Constitutional:  No weight loss, night sweats,  weight loss  HEENT:  No headaches, Difficulty swallowing,Tooth/dental problems,Sore throat,  No sneezing, itching, ear ache, nasal congestion, post nasal drip,  Cardio-vascular:  No chest pain, Orthopnea, PND, anasarca, dizziness, palpitations.no Bilateral lower extremity swelling  GI:  No heartburn, indigestion, abdominal pain, nausea, vomiting, diarrhea, change in bowel habits, loss of appetite, melena, blood in stool, hematemesis Resp:  no shortness of breath at rest. No dyspnea on exertion, No excess mucus, no productive cough, No non-productive cough,  No coughing up of blood.No change in color of mucus.No wheezing. Skin:  no rash or lesions. No jaundice GU:  no dysuria, change in color of urine, no urgency or frequency. No straining to urinate.  No flank pain.  Musculoskeletal:  No joint pain or no joint swelling. No decreased range of motion. No back pain.  Psych:  No change in mood or affect. No depression or anxiety. No memory loss.  Neuro: no localizing neurological complaints, no tingling, no weakness, no double vision, no gait abnormality, no slurred speech, no confusion  Otherwise ROS are negative except for above, 10 systems were reviewed  Past Medical History: Past Medical History  Diagnosis Date  . Myocardial infarct    History reviewed. No pertinent past surgical history.   Medications: Prior to Admission medications   Medication Sig Start Date End Date Taking? Authorizing Provider  cefPROZIL (CEFZIL) 500 MG tablet Take 500 mg by mouth 2 (two) times daily.   Yes Historical Provider, MD  fluticasone (FLONASE) 50 MCG/ACT nasal spray Place 2 sprays into the nose daily.   Yes Historical Provider, MD  niacin (NIASPAN) 1000 MG CR tablet Take 1,000 mg by mouth at bedtime.   Yes Historical Provider, MD  omeprazole (PRILOSEC) 20 MG capsule Take 20 mg by mouth daily.   Yes Historical Provider, MD  pravastatin (PRAVACHOL) 40 MG tablet Take 40 mg by mouth daily.   Yes Historical Provider, MD  ramipril (ALTACE) 10 MG capsule Take 10 mg by mouth daily.   Yes Historical Provider, MD    Allergies:  No Known Allergies  Social History:  Ambulatory   independently   Lives at  home   does not have a smoking history on file. He does not have any smokeless tobacco history on file. He reports that he does not drink alcohol.   Family History: family history is not on file.    Physical Exam: Patient Vitals for the past 24 hrs:  BP Temp Temp src Pulse Resp SpO2  08/02/12 2233 132/54 mmHg - - 62  - 94 %  08/02/12 1654 185/78  mmHg 97.6 F (36.4 C) Oral 67  14  99 %    1. General:  in No Acute distress 2. Psychological: Alert and   Oriented 3. Head/ENT:     Dry Mucous Membranes                          Head Non traumatic, neck supple,                          No lymphadenomapathy                          Normal   Dentition 4. SKIN:   decreased Skin turgor,  Skin clean Dry and intact no rash 5. Heart: Regular rate and rhythm no Murmur, Rub or gallop 6. Lungs: Clear to auscultation bilaterally, no wheezes or crackles   7. Abdomen: Soft, non-tender, Non distended 8. Lower extremities: no clubbing, cyanosis, or edema 9. Neurologically strength 5/5 no evidence of droop, CN 2-12 intact. nomeningismus 10. MSK: Normal range of motion  body mass index is unknown because there is no height or weight on file.   Labs on Admission:   [facility] 08/02/12 1704  NA 135  K 3.5  CL 100  CO2 22  GLUCOSE 112*  BUN 7  CREATININE 0.62  CALCIUM 9.0  MG --  PHOS --    Basename 08/02/12 1704  AST 24  ALT 14  ALKPHOS 62  BILITOT 0.4  PROT 7.5  ALBUMIN 3.8   No results found for this basename: LIPASE:2,AMYLASE:2 in the last 72 hours  Basename 08/02/12 1704  WBC 8.9  NEUTROABS 7.0  HGB 13.5  HCT 40.3  MCV 81.7  PLT 223    Basename 08/02/12 1704  CKTOTAL --  CKMB --  CKMBINDEX --  TROPONINI <0.30   No results found for this basename: TSH,T4TOTAL,FREET3,T3FREE,THYROIDAB in the last 72 hours No results found for this basename: VITAMINB12:2,FOLATE:2,FERRITIN:2,TIBC:2,IRON:2,RETICCTPCT:2 in the last 72 hours No results found for this basename: HGBA1C    CrCl is unknown because there is no height on file for the current visit. ABG No results found for this basename: phart, pco2, po2, hco3, tco2, acidbasedef, o2sat     No results found for this basename: DDIMER     Other results:  I have pearsonaly reviewed this: ECG REPORT  Rate: 65  Rhythm: NSR ST&T Change: no ischemia   UA no evidence of  infection   Cultures: No results found for this basename: sdes, specrequest, cult, reptstatus       Radiological Exams on Admission: Ct Head Wo Contrast  08/02/2012  *RADIOLOGY REPORT*  Clinical Data: Severe headache.  CT HEAD WITHOUT CONTRAST  Technique:  Contiguous axial images were obtained from the  base of the skull through the vertex without contrast.  Comparison: None.  Findings: No acute infarct, hemorrhage, mass lesion is present. The ventricles are of normal size.  No significant extra-axial fluid collection is present.  Minimal subcortical white matter disease is likely within normal limits for age.  Mild mucosal thickening is present in the ethmoid air cells.  The paranasal sinuses and mastoid air cells are otherwise clear.  IMPRESSION:  1.  Normal CT appearance of the brain for age. 2.  Minimal ethmoid sinus disease.   Original Report Authenticated By: Jamesetta Orleans. MATTERN, M.D.     Chart has been reviewed  Assessment/Plan  71 year old gentleman with severe headache, meningismus is currently improved fever of unclear etiology.  Present on Admission:  .Fever - etiology unclear, currently he is a febrile overall somewhat improved it does not appear to be toxic vitals are stable no evidence of sepsis. We'll continue his doxycycline for now. Could obtain influenza panel  .Meningismus - currently improved. Given that patient is afebrile normal white blood cell count and improving clinically we'll hold off on emergency LP on antibiotics for now. In a.m. to 5 diarrhea since the patient and speak to ID. He may still benefit from LP given severe headache for further testing.  Marland KitchenHeadache - etiology unclear things to consider are encephalitis given his generalized confusion, also consider cranial venous thrombosis in a setting of recent sinusitis. In the morning would recommend obtaining an MRI an MRV can be added if suspicion is strong. We'll hold off on ordering for now until LP has been  done.  Back pain - no localized neurological deficits no urinary incontinence plain films unremarkable consider father workup no clear etiology noted. Prophylaxis: SCD Protonix  CODE STATUS: FULL CODE  Other plan as per orders.  I have spent a total of 60 min on this admission  Sheppard Luckenbach 08/02/2012, 11:29 PM

## 2012-08-03 ENCOUNTER — Observation Stay (HOSPITAL_COMMUNITY): Payer: Medicare Other

## 2012-08-03 ENCOUNTER — Encounter (HOSPITAL_COMMUNITY): Payer: Self-pay | Admitting: *Deleted

## 2012-08-03 LAB — TSH: TSH: 0.545 u[IU]/mL (ref 0.350–4.500)

## 2012-08-03 LAB — COMPREHENSIVE METABOLIC PANEL
ALT: 9 U/L (ref 0–53)
AST: 20 U/L (ref 0–37)
Albumin: 3 g/dL — ABNORMAL LOW (ref 3.5–5.2)
Alkaline Phosphatase: 50 U/L (ref 39–117)
CO2: 23 mEq/L (ref 19–32)
Chloride: 102 mEq/L (ref 96–112)
Creatinine, Ser: 0.6 mg/dL (ref 0.50–1.35)
GFR calc non Af Amer: >90 mL/min (ref >90)
Potassium: 3.5 mEq/L (ref 3.5–5.1)
Sodium: 135 mEq/L (ref 135–145)
Total Bilirubin: 0.3 mg/dL (ref 0.3–1.2)

## 2012-08-03 LAB — C-REACTIVE PROTEIN: CRP: <0.5 mg/dL — ABNORMAL LOW (ref <0.60)

## 2012-08-03 LAB — CBC
MCH: 27.9 pg (ref 26.0–34.0)
MCHC: 33.8 g/dL (ref 30.0–36.0)
MCV: 82.5 fL (ref 78.0–100.0)
Platelets: 180 10*3/uL (ref 150–400)
RBC: 4.05 MIL/uL — ABNORMAL LOW (ref 4.22–5.81)

## 2012-08-03 LAB — HERPES SIMPLEX VIRUS(HSV) DNA BY PCR
HSV 1 DNA: NOT DETECTED
HSV 2 DNA: NOT DETECTED

## 2012-08-03 LAB — CSF CELL COUNT WITH DIFFERENTIAL
Eosinophils, CSF: 1 % (ref 0–1)
Segmented Neutrophils-CSF: 2 % (ref 0–6)
WBC, CSF: 1073 /mm3 (ref 0–5)

## 2012-08-03 LAB — PHOSPHORUS: Phosphorus: 3 mg/dL (ref 2.3–4.6)

## 2012-08-03 LAB — CRYPTOCOCCAL ANTIGEN, CSF

## 2012-08-03 LAB — INFLUENZA PANEL BY PCR (TYPE A & B): Influenza B By PCR: NEGATIVE

## 2012-08-03 LAB — PATHOLOGIST SMEAR REVIEW

## 2012-08-03 MED ORDER — SODIUM CHLORIDE 0.9 % IJ SOLN
3.0000 mL | Freq: Two times a day (BID) | INTRAMUSCULAR | Status: DC
  Administered 2012-08-03 – 2012-08-04 (×3): 3 mL via INTRAVENOUS

## 2012-08-03 MED ORDER — PANTOPRAZOLE SODIUM 40 MG PO TBEC
40.0000 mg | DELAYED_RELEASE_TABLET | Freq: Every day | ORAL | Status: DC
  Administered 2012-08-03 – 2012-08-04 (×2): 40 mg via ORAL
  Filled 2012-08-03: qty 1

## 2012-08-03 MED ORDER — DEXTROSE 5 % IV SOLN
10.0000 mg/kg | Freq: Three times a day (TID) | INTRAVENOUS | Status: DC
  Administered 2012-08-03 – 2012-08-04 (×3): 670 mg via INTRAVENOUS
  Filled 2012-08-03 (×5): qty 13.4

## 2012-08-03 MED ORDER — DOCUSATE SODIUM 100 MG PO CAPS
100.0000 mg | ORAL_CAPSULE | Freq: Two times a day (BID) | ORAL | Status: DC
  Administered 2012-08-03 – 2012-08-04 (×3): 100 mg via ORAL
  Filled 2012-08-03 (×3): qty 1

## 2012-08-03 MED ORDER — BIOTENE DRY MOUTH MT LIQD
15.0000 mL | Freq: Two times a day (BID) | OROMUCOSAL | Status: DC
  Administered 2012-08-03: 15 mL via OROMUCOSAL

## 2012-08-03 MED ORDER — HYDROCODONE-ACETAMINOPHEN 5-325 MG PO TABS
1.0000 | ORAL_TABLET | ORAL | Status: DC | PRN
  Administered 2012-08-03 – 2012-08-04 (×2): 2 via ORAL
  Filled 2012-08-03 (×2): qty 2

## 2012-08-03 MED ORDER — SIMVASTATIN 20 MG PO TABS
20.0000 mg | ORAL_TABLET | Freq: Every day | ORAL | Status: DC
  Administered 2012-08-03 – 2012-08-04 (×2): 20 mg via ORAL
  Filled 2012-08-03 (×3): qty 1

## 2012-08-03 MED ORDER — DOXYCYCLINE HYCLATE 100 MG PO TABS
100.0000 mg | ORAL_TABLET | Freq: Two times a day (BID) | ORAL | Status: DC
  Administered 2012-08-03 – 2012-08-05 (×5): 100 mg via ORAL
  Filled 2012-08-03 (×6): qty 1

## 2012-08-03 MED ORDER — NIACIN ER (ANTIHYPERLIPIDEMIC) 1000 MG PO TBCR
1000.0000 mg | EXTENDED_RELEASE_TABLET | Freq: Every day | ORAL | Status: DC
Start: 2012-08-03 — End: 2012-08-03

## 2012-08-03 MED ORDER — PROMETHAZINE HCL 25 MG/ML IJ SOLN: 12.5000 mg | Freq: Four times a day (QID) | INTRAMUSCULAR | Status: DC | PRN

## 2012-08-03 MED ORDER — ONDANSETRON HCL 4 MG/2ML IJ SOLN
4.0000 mg | Freq: Four times a day (QID) | INTRAMUSCULAR | Status: DC | PRN
  Administered 2012-08-03 – 2012-08-05 (×7): 4 mg via INTRAVENOUS
  Filled 2012-08-03 (×6): qty 2

## 2012-08-03 MED ORDER — ALBUTEROL SULFATE (5 MG/ML) 0.5% IN NEBU: 2.5000 mg | INHALATION_SOLUTION | RESPIRATORY_TRACT | Status: DC | PRN

## 2012-08-03 MED ORDER — HYDROMORPHONE HCL PF 1 MG/ML IJ SOLN
1.0000 mg | Freq: Once | INTRAMUSCULAR | Status: AC
  Administered 2012-08-03: 1 mg via INTRAVENOUS
  Filled 2012-08-03: qty 1

## 2012-08-03 MED ORDER — NIACIN ER 500 MG PO CPCR
1000.0000 mg | ORAL_CAPSULE | Freq: Every day | ORAL | Status: DC
  Administered 2012-08-04: 1000 mg via ORAL
  Filled 2012-08-03 (×3): qty 2

## 2012-08-03 MED ORDER — ONDANSETRON HCL 4 MG PO TABS: 4.0000 mg | ORAL_TABLET | Freq: Four times a day (QID) | ORAL | Status: DC | PRN

## 2012-08-03 MED ORDER — GUAIFENESIN-DM 100-10 MG/5ML PO SYRP: 5.0000 mL | ORAL_SOLUTION | ORAL | Status: DC | PRN

## 2012-08-03 MED ORDER — ACETAMINOPHEN 325 MG PO TABS: 650.0000 mg | ORAL_TABLET | Freq: Four times a day (QID) | ORAL | Status: DC | PRN

## 2012-08-03 MED ORDER — SODIUM CHLORIDE 0.9 % IV SOLN
INTRAVENOUS | Status: AC
  Administered 2012-08-03: 08:00:00 via INTRAVENOUS

## 2012-08-03 MED ORDER — HYDROMORPHONE HCL PF 1 MG/ML IJ SOLN
1.0000 mg | INTRAMUSCULAR | Status: DC | PRN
  Administered 2012-08-03 – 2012-08-05 (×6): 1 mg via INTRAVENOUS
  Filled 2012-08-03 (×5): qty 1

## 2012-08-03 MED ORDER — ONDANSETRON HCL 4 MG/2ML IJ SOLN
INTRAMUSCULAR | Status: AC
  Administered 2012-08-03: 4 mg
  Filled 2012-08-03: qty 2

## 2012-08-03 MED ORDER — ACETAMINOPHEN 650 MG RE SUPP: 650.0000 mg | Freq: Four times a day (QID) | RECTAL | Status: DC | PRN

## 2012-08-03 MED ORDER — PROMETHAZINE HCL 25 MG PO TABS
12.5000 mg | ORAL_TABLET | Freq: Four times a day (QID) | ORAL | Status: DC | PRN
  Administered 2012-08-03 – 2012-08-04 (×2): 12.5 mg via ORAL
  Filled 2012-08-03 (×2): qty 1

## 2012-08-03 MED ORDER — CHLORHEXIDINE GLUCONATE 0.12 % MT SOLN
15.0000 mL | Freq: Two times a day (BID) | OROMUCOSAL | Status: DC
  Administered 2012-08-03 – 2012-08-04 (×4): 15 mL via OROMUCOSAL
  Filled 2012-08-03 (×2): qty 15

## 2012-08-03 NOTE — Procedures (Signed)
LP done under fluoro guidance at L3/4. Opening pressure 27 cm water. 122 clear CSF collected.

## 2012-08-03 NOTE — ED Notes (Signed)
Pt to xray

## 2012-08-03 NOTE — Progress Notes (Signed)
[facility]                                                                                Patient Demographics  Jason Spence, is a 71 y.o. male  WUJ:[id number]  NFA:[id number]  DOB - 1941-06-23  Admit date - 08/02/2012  Admitting Physician Therisa Doyne, MD  Outpatient Primary MD for the patient is [name],[name], MD  LOS - 1   Chief Complaint  Patient presents with  . Headache        Assessment & Plan   1. Headache and photophobia - ongoing for 5 days likely viral meningitis as suggested by his CSF, CSF shows largely lymphocytic leukocytosis, increased protein, on exam patient does not have meningismus his neck is actually soft, he does have dull L. spine back pain with stable x-ray. Does not have a white count or fever, this could be viral meningitis versus atypical meningitis caused by Lyme disease/[place] spotted fever. Have added HSV serology to CSF, Lyme serology to CSF and blood, [facility] spotted fever serology to blood. Have discussed the case with Dr.Comer infectious disease who will see the patient today. For now doxycycline will be continued, will defer addition of acyclovir or Rocephin to Dr. Luciana Axe.   2. History of dyslipidemia GERD, hypertension and CAD. - No acute issues home dose Altace, statin, Niacin and PPI will be continued. He is chest pain as chest symptom free.     Code Status: Full  Family Communication: Discussed with patient and family members bedside  Disposition Plan: Home    Procedures LP   Consults  ID   Time Spent in minutes   40   Antibiotics     Anti-infectives     Start     Dose/Rate Route Frequency Ordered Stop   08/03/12 0400   doxycycline (VIBRA-TABS) tablet 100 mg        100 mg Oral Every 12 hours 08/03/12 0302            Scheduled Meds:   . sodium chloride   Intravenous STAT  . antiseptic oral rinse  15 mL Mouth Rinse q12n4p  . chlorhexidine  15 mL Mouth Rinse BID  .  docusate sodium  100 mg Oral BID  . doxycycline  100 mg Oral Q12H  .  HYDROmorphone (DILAUDID) injection  1 mg Intravenous Once  .  morphine injection  4 mg Intravenous Once  .  morphine injection  4 mg Intravenous Once  .  morphine injection  4 mg Intravenous Once  . niacin  1,000 mg Oral QHS  . ondansetron      . ondansetron  4 mg Intravenous Once  . pantoprazole  40 mg Oral Daily  . simvastatin  20 mg Oral q1800  . sodium chloride  1,000 mL Intravenous Once  . sodium chloride  3 mL Intravenous Q12H  . DISCONTD: sodium chloride   Intravenous STAT  . DISCONTD: niacin  1,000 mg Oral QHS   Continuous Infusions:  PRN Meds:.acetaminophen, acetaminophen, albuterol, guaiFENesin-dextromethorphan, HYDROcodone-acetaminophen, HYDROmorphone (DILAUDID) injection, ondansetron (ZOFRAN) IV, ondansetron   DVT Prophylaxis   SCDs    Lab Results  Component Value Date   PLT  180 08/03/2012      Susa Raring K M.D on 08/03/2012 at 11:29 AM  Between 7am to 7pm - Pager - [phone]  After 7pm go to [email or web address] - password [email or web address]  And look for the night coverage person covering for me after hours  [facility] Office  [phone]    Subjective:   [name] today has, No headache, No chest pain, No abdominal pain - No Nausea, No new weakness tingling or numbness, No Cough - SOB.    Objective:   Filed Vitals:   08/03/12 0301 08/03/12 0606 08/03/12 0700 08/03/12 1017  BP: 134/55 143/58  140/60  Pulse: 63 59  60  Temp: 98.8 F (37.1 C) 97.9 F (36.6 C)  98.4 F (36.9 C)  TempSrc: Oral Oral    Resp: 16 18  20   Height:   5\' 8"  (1.727 m)   Weight:   67.132 kg (148 lb)   SpO2: 97% 95%  93%    Wt Readings from Last 3 Encounters:  08/03/12 67.132 kg (148 lb)     Intake/Output Summary (Last 24 hours) at 08/03/12 1129 Last data filed at 08/03/12 0900  Gross per 24 hour  Intake      0 ml  Output      0 ml  Net      0 ml    Exam Awake Alert, Oriented X 3,  No new F.N deficits, Normal affect Lane.AT,PERRAL Supple Neck,No JVD, No cervical lymphadenopathy appriciated.  Symmetrical Chest wall movement, Good air movement bilaterally, CTAB RRR,No Gallops,Rubs or new Murmurs, No Parasternal Heave +ve B.Sounds, Abd Soft, Non tender, No organomegaly appriciated, No rebound - guarding or rigidity. No Cyanosis, Clubbing or edema, No new Rash or bruise      Data Review   Micro Results No results found for this or any previous visit (from the past 240 hour(s)).  Radiology Reports Dg Lumbar Spine Complete  08/03/2012  *RADIOLOGY REPORT*  Clinical Data: Low back pain for 1 week.  No injury.  LUMBAR SPINE - COMPLETE 4+ VIEW  Comparison: None.  Findings: Five lumbar type vertebrae.  Normal alignment of the lumbar vertebrae and facet joints.  Mild degenerative changes throughout with endplate hypertrophic changes.  Narrowed lumbar sacral interspace.  No vertebral compression deformities.  No focal bone lesion or bone destruction.  Bone cortex and trabecular architecture appear intact.  Vascular calcifications in the aorta.  IMPRESSION: No displaced fractures identified.  Mild degenerative changes.   Original Report Authenticated By: Marlon Pel, M.D.    Ct Head Wo Contrast  08/02/2012  *RADIOLOGY REPORT*  Clinical Data: Severe headache.  CT HEAD WITHOUT CONTRAST  Technique:  Contiguous axial images were obtained from the base of the skull through the vertex without contrast.  Comparison: None.  Findings: No acute infarct, hemorrhage, mass lesion is present. The ventricles are of normal size.  No significant extra-axial fluid collection is present.  Minimal subcortical white matter disease is likely within normal limits for age.  Mild mucosal thickening is present in the ethmoid air cells.  The paranasal sinuses and mastoid air cells are otherwise clear.  IMPRESSION:  1.  Normal CT appearance of the brain for age. 2.  Minimal ethmoid sinus disease.   Original  Report Authenticated By: Jamesetta Orleans. MATTERN, M.D.    Dg Lumbar Puncture Fluoro Guide  08/03/2012  *RADIOLOGY REPORT*  Clinical Data:  Headache, fever.  DIAGNOSTIC LUMBAR PUNCTURE UNDER FLUOROSCOPIC GUIDANCE  Fluoroscopy time:  0.1 minutes.  Technique:  Informed consent was obtained from the patient prior to the procedure, including potential complications of headache, allergy, and pain.   With the patient prone, the lower back was prepped with Betadine.  1% Lidocaine was used for local anesthesia. Lumbar puncture was performed at the L3-4 level using a 20 gauge needle with return of clear CSF with an opening pressure of 27 cm water.   12 ml of CSF were obtained for laboratory studies.  The patient tolerated the procedure well and there were no apparent complications.  IMPRESSION: Technically successful lumbar puncture under fluoroscopic guidance as described above.  The opening pressure elevated at 27 cm water.   Original Report Authenticated By: Cyndie Chime, M.D.     Results for TRESTAN, VAHLE (MRN [id number]) as of 08/03/2012 11:30  Ref. Range 08/03/2012 09:11  Glucose, CSF Latest Range: 43-76 mg/dL 54  Total  Protein, CSF Latest Range: 15-45 mg/dL 409 (H)  RBC Count, CSF Latest Range: 0 /cu mm 11 (H)  WBC, CSF Latest Range: 0-5 /cu mm 1073 (HH)  Segmented Neutrophils-CSF Latest Range: 0-6 % 2  Lymphs, CSF Latest Range: 40-80 % 90 (H)  Monocyte-Macrophage-Spinal Fluid Latest Range: 15-45 % 7 (L)  Eosinophils, CSF Latest Range: 0-1 % 1  Appearance, CSF Latest Range: CLEAR  HAZY (A)  Color, CSF Latest Range: COLORLESS  STRAW (A)  Supernatant No range found NOT INDICATED  Tube # No range found 3     CBC  Lab 08/03/12 0630 08/02/12 1704  WBC 9.6 8.9  HGB 11.3* 13.5  HCT 33.4* 40.3  PLT 180 223  MCV 82.5 81.7  MCH 27.9 27.4  MCHC 33.8 33.5  RDW 14.5 14.3  LYMPHSABS -- 1.1  MONOABS -- 0.7  EOSABS -- 0.1  BASOSABS -- 0.1  BANDABS -- --    Chemistries   Lab 08/03/12  0630 08/02/12 1704  NA 135 135  K 3.5 3.5  CL 102 100  CO2 23 22  GLUCOSE 100* 112*  BUN 9 7  CREATININE 0.60 0.62  CALCIUM 8.2* 9.0  MG 1.8 --  AST 20 24  ALT 9 14  ALKPHOS 50 62  BILITOT 0.3 0.4   ------------------------------------------------------------------------------------------------------------------ estimated creatinine clearance is 79.2 ml/min (by C-G formula based on Cr of 0.6). ------------------------------------------------------------------------------------------------------------------ No results found for this basename: HGBA1C:2 in the last 72 hours ------------------------------------------------------------------------------------------------------------------ No results found for this basename: CHOL:2,HDL:2,LDLCALC:2,TRIG:2,CHOLHDL:2,LDLDIRECT:2 in the last 72 hours ------------------------------------------------------------------------------------------------------------------ No results found for this basename: TSH,T4TOTAL,FREET3,T3FREE,THYROIDAB in the last 72 hours ------------------------------------------------------------------------------------------------------------------ No results found for this basename: VITAMINB12:2,FOLATE:2,FERRITIN:2,TIBC:2,IRON:2,RETICCTPCT:2 in the last 72 hours  Coagulation profile No results found for this basename: INR:5,PROTIME:5 in the last 168 hours  No results found for this basename: DDIMER:2 in the last 72 hours  Cardiac Enzymes  Lab 08/02/12 1704  CKMB --  TROPONINI <0.30  MYOGLOBIN --   ------------------------------------------------------------------------------------------------------------------ No components found with this basename: POCBNP:3

## 2012-08-03 NOTE — Consult Note (Signed)
    [facility]     Reason for Consult: encephalitis    Referring Physician:  Dr. Thedore Mins  Principal Problem:  *Meningismus Active Problems:  Fever  Headache      . sodium chloride   Intravenous STAT  . antiseptic oral rinse  15 mL Mouth Rinse q12n4p  . chlorhexidine  15 mL Mouth Rinse BID  . docusate sodium  100 mg Oral BID  . doxycycline  100 mg Oral Q12H  .  HYDROmorphone (DILAUDID) injection  1 mg Intravenous Once  .  morphine injection  4 mg Intravenous Once  .  morphine injection  4 mg Intravenous Once  .  morphine injection  4 mg Intravenous Once  . niacin  1,000 mg Oral QHS  . ondansetron      . ondansetron  4 mg Intravenous Once  . pantoprazole  40 mg Oral Daily  . simvastatin  20 mg Oral q1800  . sodium chloride  1,000 mL Intravenous Once  . sodium chloride  3 mL Intravenous Q12H  . DISCONTD: sodium chloride   Intravenous STAT  . DISCONTD: niacin  1,000 mg Oral QHS    Recommendations: Acyclovir for possible encephalitis  Can d/c isolation, not c/w bacterial meningitis, flu negative.  MRI of back  Assessment: Very high WBC and no signs of meningismus.  Consistent with encephalitis of viral origin.  Lyme not prevalent in this area so no indication to test.  RMSF, West Nile, Enteroviruses all possible.  HSV always possible.  I am not sure why he also has significant back pain.  ? Epidural abscess.  It was prior to LP.    Antibiotics: Doxycycline 10/16 -  Acyclovir 10/17 -   HPI: Jason Spence is a 71 y.o. male with a history of MI with stent placement who presented to his PCP initially with headache and sinus pain with some relief with antibiotics.  He was seen in [facility] and treated for possible RMSF.  He went back to his PCP and with headache and fever there was concern with encephalitis or meningitis.  There was also a report of confusion, thought he family states he was never confused.  LP was not sucessful in ED and was admitted.   The following am, LP showed about 1000 WBCs with lymphocyte predominance.  He continues to have a headache and back pain.  He has remained largely afebrile and peripheral WBC ok.  No neck pain.  No sick contacts or travel out of area.    Review of Systems: Pertinent items are noted in HPI.  Past Medical History  Diagnosis Date  . Myocardial infarct     History  Substance Use Topics  . Smoking status: Former Smoker -- 0.5 packs/day    Quit date: 08/04/1995  . Smokeless tobacco: Not on file  . Alcohol Use: No    History reviewed. No pertinent family history. No Known Allergies  OBJECTIVE: Blood pressure 156/69, pulse 64, temperature 97.9 F (36.6 C), temperature source Oral, resp. rate 18, height 5\' 8"  (1.727 m), weight 148 lb (67.132 kg), SpO2 95.00%. General: Awake, alert, oriented x 3 Skin: no rashes Lungs: CTA B Cor: RRR without m/r/g Abdomen: soft, ntnd, +bs Back -  No tenderness in back, no bulging disc  Microbiology: No results found for this or any previous visit (from the past 240 hour(s)).  Staci Righter, MD [facility] [phone] pager  [phone] cell 08/03/2012, 1:37 PM

## 2012-08-03 NOTE — Progress Notes (Signed)
ANTIBIOTIC CONSULT NOTE - INITIAL  Pharmacy Consult for acyclovir Indication: ?encephalitis  No Known Allergies  Patient Measurements: Height: 5\' 8"  (172.7 cm) Weight: 148 lb (67.132 kg) IBW/kg (Calculated) : 68.4   Vital Signs: Temp: 97.9 F (36.6 C) (10/17 1315) Temp src: Oral (10/17 0606) BP: 156/69 mmHg (10/17 1315) Pulse Rate: 64  (10/17 1315) Intake/Output from previous day:   Intake/Output from this shift:    Labs:  Basename 08/03/12 0630 08/02/12 1704  WBC 9.6 8.9  HGB 11.3* 13.5  PLT 180 223  LABCREA -- --  CREATININE 0.60 0.62   Estimated Creatinine Clearance: 79.2 ml/min (by C-G formula based on Cr of 0.6). No results found for this basename: VANCOTROUGH:2,VANCOPEAK:2,VANCORANDOM:2,GENTTROUGH:2,GENTPEAK:2,GENTRANDOM:2,TOBRATROUGH:2,TOBRAPEAK:2,TOBRARND:2,AMIKACINPEAK:2,AMIKACINTROU:2,AMIKACIN:2, in the last 72 hours   Microbiology: No results found for this or any previous visit (from the past 720 hour(s)).  Medical History: Past Medical History  Diagnosis Date  . Myocardial infarct    Medications:  Prescriptions prior to admission  Medication Sig Dispense Refill  . cefPROZIL (CEFZIL) 500 MG tablet Take 500 mg by mouth 2 (two) times daily.      . fluticasone (FLONASE) 50 MCG/ACT nasal spray Place 2 sprays into the nose daily.      . niacin (NIASPAN) 1000 MG CR tablet Take 1,000 mg by mouth at bedtime.      Marland Kitchen omeprazole (PRILOSEC) 20 MG capsule Take 20 mg by mouth daily.      . pravastatin (PRAVACHOL) 40 MG tablet Take 40 mg by mouth daily.      . ramipril (ALTACE) 10 MG capsule Take 10 mg by mouth daily.       Assessment: 71 yom with recent sinus pain and headache treated with antibiotic PTA presented to the hospital with headache, fever and confusion. Currently pt is afebrile with a WBC WNL. To start empiric acyclovir for possible viral encephalitis. CrCL ~4ml/min.   Acyclovir 10/17>> Doxy 10/17>>  Goal of Therapy:  Eradication of  infection  Plan:  1. Acyclovir 670mg  (10mg /kg) IV Q8H  2. F/u renal fxn, C&S and clinical status  Fardowsa Authier, Drake Leach 08/03/2012,2:21 PM

## 2012-08-04 NOTE — Progress Notes (Signed)
[facility]  Date of Admission:  08/02/2012  Antibiotics: Acyclovir 10/17 -10/18 Doxycycline 10/16 -   Subjective: Feels better  Objective: Temp:  [97.9 F (36.6 C)-99 F (37.2 C)] 98.6 F (37 C) (10/18 0949) Pulse Rate:  [58-69] 69  (10/18 0949) Resp:  [17-20] 17  (10/18 0949) BP: (133-174)/(52-69) 142/62 mmHg (10/18 0949) SpO2:  [94 %-97 %] 96 % (10/18 0949)  General: Awake, alert, oriented to person place and time Skin: no rashes Lungs: CTA B  Lab Results Lab Results  Component Value Date   WBC 9.6 08/03/2012   HGB 11.3* 08/03/2012   HCT 33.4* 08/03/2012   MCV 82.5 08/03/2012   PLT 180 08/03/2012    Lab Results  Component Value Date   CREATININE 0.60 08/03/2012   BUN 9 08/03/2012   NA 135 08/03/2012   K 3.5 08/03/2012   CL 102 08/03/2012   CO2 23 08/03/2012    Lab Results  Component Value Date   ALT 9 08/03/2012   AST 20 08/03/2012   ALKPHOS 50 08/03/2012   BILITOT 0.3 08/03/2012      Microbiology: No results found for this or any previous visit (from the past 240 hour(s)).  Studies/Results: Dg Lumbar Spine Complete  08/03/2012  *RADIOLOGY REPORT*  Clinical Data: Low back pain for 1 week.  No injury.  LUMBAR SPINE - COMPLETE 4+ VIEW  Comparison: None.  Findings: Five lumbar type vertebrae.  Normal alignment of the lumbar vertebrae and facet joints.  Mild degenerative changes throughout with endplate hypertrophic changes.  Narrowed lumbar sacral interspace.  No vertebral compression deformities.  No focal bone lesion or bone destruction.  Bone cortex and trabecular architecture appear intact.  Vascular calcifications in the aorta.  IMPRESSION: No displaced fractures identified.  Mild degenerative changes.   Original Report Authenticated By: Marlon Pel, M.D.    Ct Head Wo Contrast  08/02/2012  *RADIOLOGY REPORT*  Clinical Data: Severe headache.  CT HEAD WITHOUT CONTRAST  Technique:  Contiguous axial images were  obtained from the base of the skull through the vertex without contrast.  Comparison: None.  Findings: No acute infarct, hemorrhage, mass lesion is present. The ventricles are of normal size.  No significant extra-axial fluid collection is present.  Minimal subcortical white matter disease is likely within normal limits for age.  Mild mucosal thickening is present in the ethmoid air cells.  The paranasal sinuses and mastoid air cells are otherwise clear.  IMPRESSION:  1.  Normal CT appearance of the brain for age. 2.  Minimal ethmoid sinus disease.   Original Report Authenticated By: Jamesetta Orleans. MATTERN, M.D.    Mr Lumbar Spine Wo Contrast  08/04/2012  *RADIOLOGY REPORT*  Clinical Data: Epidural abscess.  Diskitis. Recent falls.  Low grade fever.  MRI LUMBAR SPINE WITHOUT CONTRAST  Technique:  Multiplanar and multiecho pulse sequences of the lumbar spine were obtained without intravenous contrast.  Comparison: 08/03/2012 radiographs.  Findings: The numbering convention used for this exam terms L5-S1 as the last full intervertebral disc space above the sacrum.  Prior radiographs demonstrate five lumbar type vertebral bodies. Numbering convention used on prior exam preserved.  Vertebral body height is normal.  Marrow signal is normal.  There is no liquefaction of the discs or paravertebral phlegmon to suggest vertebral infection.  No epidural abscess/fluid collection. Vertebral body height is preserved.  No compression fracture.  Large left L5 benign vertebral body hemangioma present.  The spinal cord terminates posterior to  the L1-L2 level.  The paraspinal soft tissues are within normal limits.  Circumaortic left renal vein noted.  There is a mild levoconvex lumbar curvature with the apex at L4.  T12-L1:  Negative.  L1-L2:  Tiny right paracentral disc protrusion without stenosis.  L2-L3:  Negative.  L3-L4:  Mild disc desiccation.  Shallow circumferential disc bulging.  No stenosis.  L4-L5:  Disc desiccation.   Shallow circumferential disc bulging. No stenosis.  L5-S1:  Disc desiccation and loss of height.  Mild ligamentum flavum redundancy and facet hypertrophy.  Crowding both lateral recesses without neural compression.  There is broad-based posterior disc bulging.  The disc is asymmetrically degenerated on the right with degenerative endplate changes.  Mild bilateral foraminal stenosis is present associated with endplate spurring and bulging disc extending into both neural foramina potentially affecting both exiting L5 nerves.  IMPRESSION: 1.  Negative for spinal infection.  No evidence of diskitis/osteomyelitis.  No epidural abscess. 2.  Mild lumbar spondylosis as described above, most pronounced at L5-S1.  Crowding both lateral recesses without neural compression. Mild bilateral foraminal stenosis potentially affects both exiting L5 nerves.   Original Report Authenticated By: Andreas Newport, M.D.    Dg Lumbar Puncture Fluoro Guide  08/03/2012  *RADIOLOGY REPORT*  Clinical Data:  Headache, fever.  DIAGNOSTIC LUMBAR PUNCTURE UNDER FLUOROSCOPIC GUIDANCE  Fluoroscopy time:  0.1 minutes.  Technique:  Informed consent was obtained from the patient prior to the procedure, including potential complications of headache, allergy, and pain.   With the patient prone, the lower back was prepped with Betadine.  1% Lidocaine was used for local anesthesia. Lumbar puncture was performed at the L3-4 level using a 20 gauge needle with return of clear CSF with an opening pressure of 27 cm water.   12 ml of CSF were obtained for laboratory studies.  The patient tolerated the procedure well and there were no apparent complications.  IMPRESSION: Technically successful lumbar puncture under fluoroscopic guidance as described above.  The opening pressure elevated at 27 cm water.   Original Report Authenticated By: Cyndie Chime, M.D.     Assessment/Plan: 1) viral encephalitis vs aseptic meningitis - patient recovering well, HSV  negative.  Will stop acyclovir.  I would continue doxycycline for 7 days total regardless of RMSF serology.  I do not think Tb which acts more like meningitis with neck pain and also with more confusion, monocyte predominance, and no epidemiologic history.  MRI with no signs of infection in lumbar area, pain likely due to spondylosis.    Dr. Daiva Eves available over the weekend if needed, otherwise will sign off.  Thanks  Staci Righter, MD [facility] [phone] pager   08/04/2012, 10:48 AM

## 2012-08-04 NOTE — Progress Notes (Signed)
[facility]                                                                                Patient Demographics  Jason Spence, is a 71 y.o. male  ZOX:[id number]  [id number]  DOB - [birth year]  Admit date - 08/02/2012  Admitting Physician Therisa Doyne, MD  Outpatient Primary MD for the patient is [name],[name], MD  LOS - 2   Chief Complaint  Patient presents with  . Headache        Assessment & Plan   1. Headache and photophobia - ongoing for 5 days likely viral meningitis as suggested by his CSF, CSF shows largely lymphocytic leukocytosis, increased protein, on exam patient does not have meningismus his neck is actually soft, he does have dull L. spine back pain with stable x-ray. Does not have a white count or fever, this could be viral meningitis versus atypical meningitis caused by Lyme disease/[place] spotted fever. -ve HSV serology Acylovir has been stopped by ID, Per ID continue doxycycline for total of 1 week, will order final serology tomorrow if stable will discharge. L-spine MRI unremarkable.   2. History of dyslipidemia GERD, hypertension and CAD. - No acute issues home dose Altace, statin, Niacin and PPI will be continued. He is chest pain as chest symptom free.   3. L-spine DJD and spinal stenosis- no acute issue an MRI outpatient neurosurgery followup.    Code Status: Full  Family Communication: Discussed with patient and family members bedside  Disposition Plan: Home    Procedures LP   Consults  ID   Time Spent in minutes   40   Antibiotics     Anti-infectives     Start     Dose/Rate Route Frequency Ordered Stop   08/03/12 1500   acyclovir (ZOVIRAX) 670 mg in dextrose 5 % 100 mL IVPB  Status:  Discontinued        10 mg/kg  67.1 kg 113.4 mL/hr over 60 Minutes Intravenous Every 8 hours 08/03/12 1420 08/04/12 0949   08/03/12 0400   doxycycline (VIBRA-TABS) tablet 100 mg        100 mg Oral Every 12 hours  08/03/12 0302            Scheduled Meds:    . sodium chloride   Intravenous STAT  . chlorhexidine  15 mL Mouth Rinse BID  . docusate sodium  100 mg Oral BID  . doxycycline  100 mg Oral Q12H  . niacin  1,000 mg Oral QHS  . pantoprazole  40 mg Oral Daily  . simvastatin  20 mg Oral q1800  . sodium chloride  3 mL Intravenous Q12H  . DISCONTD: acyclovir  10 mg/kg Intravenous Q8H  . DISCONTD: antiseptic oral rinse  15 mL Mouth Rinse q12n4p   Continuous Infusions:  PRN Meds:.acetaminophen, acetaminophen, albuterol, guaiFENesin-dextromethorphan, HYDROcodone-acetaminophen, HYDROmorphone (DILAUDID) injection, ondansetron (ZOFRAN) IV, ondansetron, promethazine, DISCONTD: promethazine   DVT Prophylaxis   SCDs    Lab Results  Component Value Date   PLT 180 08/03/2012      Susa Raring K M.D on 08/04/2012 at 12:21 PM  Between 7am to 7pm - Pager - [phone]  After 7pm  go to [email or web address] - password [email or web address]  And look for the night coverage person covering for me after hours  [facility] Office  [phone]    Subjective:   [name] today has, No headache, No chest pain, No abdominal pain - No Nausea, No new weakness tingling or numbness, No Cough - SOB.    Objective:   Filed Vitals:   08/03/12 2127 08/04/12 0238 08/04/12 0548 08/04/12 0949  BP: 148/53 155/57 133/52 142/62  Pulse: 58 67 65 69  Temp: 99 F (37.2 C) 98.5 F (36.9 C) 98.9 F (37.2 C) 98.6 F (37 C)  TempSrc: Oral Oral Oral Oral  Resp: 20 20 20 17   Height:      Weight:      SpO2: 94% 96% 94% 96%    Wt Readings from Last 3 Encounters:  08/03/12 67.132 kg (148 lb)     Intake/Output Summary (Last 24 hours) at 08/04/12 1221 Last data filed at 08/03/12 1900  Gross per 24 hour  Intake    760 ml  Output      0 ml  Net    760 ml    Exam Awake Alert, Oriented X 3, No new F.N deficits, Normal affect [place].AT,PERRAL Supple Neck,No JVD, No cervical lymphadenopathy appriciated.    Symmetrical Chest wall movement, Good air movement bilaterally, CTAB RRR,No Gallops,Rubs or new Murmurs, No Parasternal Heave +ve B.Sounds, Abd Soft, Non tender, No organomegaly appriciated, No rebound - guarding or rigidity. No Cyanosis, Clubbing or edema, No new Rash or bruise      Data Review   Micro Results No results found for this or any previous visit (from the past 240 hour(s)).  Radiology Reports Dg Lumbar Spine Complete  08/03/2012  *RADIOLOGY REPORT*  Clinical Data: Low back pain for 1 week.  No injury.  LUMBAR SPINE - COMPLETE 4+ VIEW  Comparison: None.  Findings: Five lumbar type vertebrae.  Normal alignment of the lumbar vertebrae and facet joints.  Mild degenerative changes throughout with endplate hypertrophic changes.  Narrowed lumbar sacral interspace.  No vertebral compression deformities.  No focal bone lesion or bone destruction.  Bone cortex and trabecular architecture appear intact.  Vascular calcifications in the aorta.  IMPRESSION: No displaced fractures identified.  Mild degenerative changes.   Original Report Authenticated By: Marlon Pel, M.D.    Ct Head Wo Contrast  08/02/2012  *RADIOLOGY REPORT*  Clinical Data: Severe headache.  CT HEAD WITHOUT CONTRAST  Technique:  Contiguous axial images were obtained from the base of the skull through the vertex without contrast.  Comparison: None.  Findings: No acute infarct, hemorrhage, mass lesion is present. The ventricles are of normal size.  No significant extra-axial fluid collection is present.  Minimal subcortical white matter disease is likely within normal limits for age.  Mild mucosal thickening is present in the ethmoid air cells.  The paranasal sinuses and mastoid air cells are otherwise clear.  IMPRESSION:  1.  Normal CT appearance of the brain for age. 2.  Minimal ethmoid sinus disease.   Original Report Authenticated By: Jamesetta Orleans. MATTERN, M.D.    Dg Lumbar Puncture Fluoro Guide  08/03/2012   *RADIOLOGY REPORT*  Clinical Data:  Headache, fever.  DIAGNOSTIC LUMBAR PUNCTURE UNDER FLUOROSCOPIC GUIDANCE  Fluoroscopy time:  0.1 minutes.  Technique:  Informed consent was obtained from the patient prior to the procedure, including potential complications of headache, allergy, and pain.   With the patient prone, the lower back was prepped with Betadine.  1% Lidocaine was used for local anesthesia. Lumbar puncture was performed at the L3-4 level using a 20 gauge needle with return of clear CSF with an opening pressure of 27 cm water.   12 ml of CSF were obtained for laboratory studies.  The patient tolerated the procedure well and there were no apparent complications.  IMPRESSION: Technically successful lumbar puncture under fluoroscopic guidance as described above.  The opening pressure elevated at 27 cm water.   Original Report Authenticated By: Cyndie Chime, M.D.     Results for BAKARY, BRAMER (MRN [id number]) as of 08/03/2012 11:30  Ref. Range 08/03/2012 09:11  Glucose, CSF Latest Range: 43-76 mg/dL 54  Total  Protein, CSF Latest Range: 15-45 mg/dL 295 (H)  RBC Count, CSF Latest Range: 0 /cu mm 11 (H)  WBC, CSF Latest Range: 0-5 /cu mm 1073 (HH)  Segmented Neutrophils-CSF Latest Range: 0-6 % 2  Lymphs, CSF Latest Range: 40-80 % 90 (H)  Monocyte-Macrophage-Spinal Fluid Latest Range: 15-45 % 7 (L)  Eosinophils, CSF Latest Range: 0-1 % 1  Appearance, CSF Latest Range: CLEAR  HAZY (A)  Color, CSF Latest Range: COLORLESS  STRAW (A)  Supernatant No range found NOT INDICATED  Tube # No range found 3     CBC  Lab 08/03/12 0630 08/02/12 1704  WBC 9.6 8.9  HGB 11.3* 13.5  HCT 33.4* 40.3  PLT 180 223  MCV 82.5 81.7  MCH 27.9 27.4  MCHC 33.8 33.5  RDW 14.5 14.3  LYMPHSABS -- 1.1  MONOABS -- 0.7  EOSABS -- 0.1  BASOSABS -- 0.1  BANDABS -- --    Chemistries   Lab 08/03/12 0630 08/02/12 1704  NA 135 135  K 3.5 3.5  CL 102 100  CO2 23 22  GLUCOSE 100* 112*  BUN 9 7    CREATININE 0.60 0.62  CALCIUM 8.2* 9.0  MG 1.8 --  AST 20 24  ALT 9 14  ALKPHOS 50 62  BILITOT 0.3 0.4   ------------------------------------------------------------------------------------------------------------------ estimated creatinine clearance is 79.2 ml/min (by C-G formula based on Cr of 0.6). ------------------------------------------------------------------------------------------------------------------ No results found for this basename: HGBA1C:2 in the last 72 hours ------------------------------------------------------------------------------------------------------------------ No results found for this basename: CHOL:2,HDL:2,LDLCALC:2,TRIG:2,CHOLHDL:2,LDLDIRECT:2 in the last 72 hours ------------------------------------------------------------------------------------------------------------------  Basename 08/03/12 0630  TSH 0.545  T4TOTAL --  T3FREE --  THYROIDAB --   ------------------------------------------------------------------------------------------------------------------ No results found for this basename: VITAMINB12:2,FOLATE:2,FERRITIN:2,TIBC:2,IRON:2,RETICCTPCT:2 in the last 72 hours  Coagulation profile No results found for this basename: INR:5,PROTIME:5 in the last 168 hours  No results found for this basename: DDIMER:2 in the last 72 hours  Cardiac Enzymes  Lab 08/02/12 1704  CKMB --  TROPONINI <0.30  MYOGLOBIN --   ------------------------------------------------------------------------------------------------------------------ No components found with this basename: POCBNP:3

## 2012-08-05 MED ORDER — HYDROCODONE-ACETAMINOPHEN 5-325 MG PO TABS: 1.0000 | ORAL_TABLET | ORAL | Status: DC | PRN

## 2012-08-05 MED ORDER — ONDANSETRON HCL 4 MG PO TABS: 4.0000 mg | ORAL_TABLET | Freq: Four times a day (QID) | ORAL | Status: DC | PRN

## 2012-08-05 MED ORDER — HYDROCODONE-ACETAMINOPHEN 5-325 MG PO TABS: 1.0000 | ORAL_TABLET | Freq: Four times a day (QID) | ORAL | Status: DC | PRN

## 2012-08-05 MED ORDER — IBUPROFEN 600 MG PO TABS: 600.0000 mg | ORAL_TABLET | Freq: Three times a day (TID) | ORAL | Status: DC | PRN

## 2012-08-05 MED ORDER — DOXYCYCLINE HYCLATE 100 MG PO TABS: 100.0000 mg | ORAL_TABLET | Freq: Two times a day (BID) | ORAL | Status: DC

## 2012-08-05 NOTE — Discharge Summary (Signed)
[facility]                                                                                   Jason Spence, is a 71 y.o. male  DOB Nov 04, 1940  MRN [id number].  Admission date:  08/02/2012  Discharge Date:  08/05/2012  Primary MD  Lilyan Punt, MD  Admitting Physician  Therisa Doyne, MD  Admission Diagnosis  Meningismus [781.6] Headache [784.0] Back pain [724.5] Fever [780.60] Headache, back pain  Discharge Diagnosis     Principal Problem:  *Meningismus Active Problems:  Fever  Headache   Past Medical History  Diagnosis Date  . Myocardial infarct     Past Surgical History  Procedure Date  . Coronary artery bypass graft      Recommendations for primary care physician for things to follow:   He is follow final CSF culture results   Discharge Diagnoses:   Principal Problem:  *Meningismus Active Problems:  Fever  Headache    Discharge Condition: Stable   Diet recommendation: See Discharge Instructions below   Consults ID Dr. Luciana Axe   History of present illness and  Hospital Course:  See H&P, Labs, Consult and Test reports for all details in brief, patient was admitted for headache and photophobia secondary to most likely viral meningitis versus rickettsial borne meningitis, patient underwent an LP which showed large amounts of wbc's large lymphocytic, high protein count, he was then seen by infectious disease who had placed him on doxycycline for total of 7 days with good effect, his HSV DNA PCR in the CSF was negative, patient never had fever or leukocytosis, he was treated with oral doxycycline along with pain and nausea  medications with good effect, he looks and feels much better today, will be discharged home on 5 more days of doxycycline recommended by infectious disease with outpatient followup with primary care physician and ID. Will request primary care physician and ID physician to kindly review final CSF culture results  her next visit.   Patient also had the low back pain of unclear [place], he underwent L-spine MRI which did not show any signs of infection, it did show some chronic L-spine DJD and L-spine stenosis for which one time outpatient neurosurgical followup is recommended as outpatient.   His chronic medical problems of hypertension and dyslipidemia stable home medications will be continued.      Today   Subjective:   Jason Spence today has no headache,no chest abdominal pain,no new weakness tingling or numbness, feels much better wants to go home today.   Objective:   Blood pressure 150/63, pulse 84, temperature 98.5 F (36.9 C), temperature source Oral, resp. rate 18, height 5\' 8"  (1.727 m), weight 67.132 kg (148 lb), SpO2 94.00%.   Intake/Output Summary (Last 24 hours) at 08/05/12 0944 Last data filed at 08/05/12 0600  Gross per 24 hour  Intake      0 ml  Output      0 ml  Net      0 ml    Exam Awake Alert, Oriented *3, No new F.N deficits, Normal affect [place].AT,PERRAL Supple Neck,No JVD, No cervical lymphadenopathy appriciated.  Symmetrical Chest wall movement, Good  air movement bilaterally, CTAB RRR,No Gallops,Rubs or new Murmurs, No Parasternal Heave +ve B.Sounds, Abd Soft, Non tender, No organomegaly appriciated, No rebound -guarding or rigidity. No Cyanosis, Clubbing or edema, No new Rash or bruise  Data Review   Major procedures and Radiology Reports - PLEASE review detailed and final reports for all details in brief -       Dg Lumbar Spine Complete  08/03/2012  *RADIOLOGY REPORT*  Clinical Data: Low back pain for 1 week.  No injury.  LUMBAR SPINE - COMPLETE 4+ VIEW  Comparison: None.  Findings: Five lumbar type vertebrae.  Normal alignment of the lumbar vertebrae and facet joints.  Mild degenerative changes throughout with endplate hypertrophic changes.  Narrowed lumbar sacral interspace.  No vertebral compression deformities.  No focal bone lesion or bone  destruction.  Bone cortex and trabecular architecture appear intact.  Vascular calcifications in the aorta.  IMPRESSION: No displaced fractures identified.  Mild degenerative changes.   Original Report Authenticated By: Marlon Pel, M.D.    Ct Head Wo Contrast  08/02/2012  *RADIOLOGY REPORT*  Clinical Data: Severe headache.  CT HEAD WITHOUT CONTRAST  Technique:  Contiguous axial images were obtained from the base of the skull through the vertex without contrast.  Comparison: None.  Findings: No acute infarct, hemorrhage, mass lesion is present. The ventricles are of normal size.  No significant extra-axial fluid collection is present.  Minimal subcortical white matter disease is likely within normal limits for age.  Mild mucosal thickening is present in the ethmoid air cells.  The paranasal sinuses and mastoid air cells are otherwise clear.  IMPRESSION:  1.  Normal CT appearance of the brain for age. 2.  Minimal ethmoid sinus disease.   Original Report Authenticated By: Jamesetta Orleans. MATTERN, M.D.    Mr Lumbar Spine Wo Contrast  08/04/2012  *RADIOLOGY REPORT*  Clinical Data: Epidural abscess.  Diskitis. Recent falls.  Low grade fever.  MRI LUMBAR SPINE WITHOUT CONTRAST  Technique:  Multiplanar and multiecho pulse sequences of the lumbar spine were obtained without intravenous contrast.  Comparison: 08/03/2012 radiographs.  Findings: The numbering convention used for this exam terms L5-S1 as the last full intervertebral disc space above the sacrum.  Prior radiographs demonstrate five lumbar type vertebral bodies. Numbering convention used on prior exam preserved.  Vertebral body height is normal.  Marrow signal is normal.  There is no liquefaction of the discs or paravertebral phlegmon to suggest vertebral infection.  No epidural abscess/fluid collection. Vertebral body height is preserved.  No compression fracture.  Large left L5 benign vertebral body hemangioma present.  The spinal cord terminates  posterior to the L1-L2 level.  The paraspinal soft tissues are within normal limits.  Circumaortic left renal vein noted.  There is a mild levoconvex lumbar curvature with the apex at L4.  T12-L1:  Negative.  L1-L2:  Tiny right paracentral disc protrusion without stenosis.  L2-L3:  Negative.  L3-L4:  Mild disc desiccation.  Shallow circumferential disc bulging.  No stenosis.  L4-L5:  Disc desiccation.  Shallow circumferential disc bulging. No stenosis.  L5-S1:  Disc desiccation and loss of height.  Mild ligamentum flavum redundancy and facet hypertrophy.  Crowding both lateral recesses without neural compression.  There is broad-based posterior disc bulging.  The disc is asymmetrically degenerated on the right with degenerative endplate changes.  Mild bilateral foraminal stenosis is present associated with endplate spurring and bulging disc extending into both neural foramina potentially affecting both exiting L5 nerves.  IMPRESSION:  1.  Negative for spinal infection.  No evidence of diskitis/osteomyelitis.  No epidural abscess. 2.  Mild lumbar spondylosis as described above, most pronounced at L5-S1.  Crowding both lateral recesses without neural compression. Mild bilateral foraminal stenosis potentially affects both exiting L5 nerves.   Original Report Authenticated By: Andreas Newport, M.D.    Dg Lumbar Puncture Fluoro Guide  08/03/2012  *RADIOLOGY REPORT*  Clinical Data:  Headache, fever.  DIAGNOSTIC LUMBAR PUNCTURE UNDER FLUOROSCOPIC GUIDANCE  Fluoroscopy time:  0.1 minutes.  Technique:  Informed consent was obtained from the patient prior to the procedure, including potential complications of headache, allergy, and pain.   With the patient prone, the lower back was prepped with Betadine.  1% Lidocaine was used for local anesthesia. Lumbar puncture was performed at the L3-4 level using a 20 gauge needle with return of clear CSF with an opening pressure of 27 cm water.   12 ml of CSF were obtained for  laboratory studies.  The patient tolerated the procedure well and there were no apparent complications.  IMPRESSION: Technically successful lumbar puncture under fluoroscopic guidance as described above.  The opening pressure elevated at 27 cm water.   Original Report Authenticated By: Cyndie Chime, M.D.     Micro Results   Results for RICHAR, YOUNGBLOOD (MRN [id number]) as of 08/05/2012 09:35  Ref. Range 08/03/2012 09:11  Glucose, CSF Latest Range: 43-76 mg/dL 54  Total  Protein, CSF Latest Range: 15-45 mg/dL 409 (H)  RBC Count, CSF Latest Range: 0 /cu mm 11 (H)  WBC, CSF Latest Range: 0-5 /cu mm 1073 (HH)  Segmented Neutrophils-CSF Latest Range: 0-6 % 2  Lymphs, CSF Latest Range: 40-80 % 90 (H)  Monocyte-Macrophage-Spinal Fluid Latest Range: 15-45 % 7 (L)  Eosinophils, CSF Latest Range: 0-1 % 1  Appearance, CSF Latest Range: CLEAR  HAZY (A)  Color, CSF Latest Range: COLORLESS  STRAW (A)  Supernatant No range found NOT INDICATED  Tube # No range found 3    Results for MORTEZ, MEADOWCROFT (MRN [id number]) as of 08/05/2012 09:35  Ref. Range 08/02/2012 20:13 08/02/2012 21:28 08/03/2012 09:11 08/03/2012 13:26  Enterovirus PCR Latest Range: Not Detected    Not Detected   HSV 1 DNA Latest Range: Not Detected    Not Detected   HSV 2 DNA Latest Range: Not Detected    Not Detected   Specimen source hsv No range found   CSF   West Nile Virus [facility] No range found  10.17.13    Influenza A By PCR Latest Range: NEGATIVE  NEGATIVE     Influenza B By PCR Latest Range: NEGATIVE  NEGATIVE     H1N1 flu by pcr Latest Range: NOT DETECTED  NOT DETECTED     Crypto Ag Latest Range: NEGATIVE    NEGATIVE   Cryptococcal Ag Titer Latest Range: NOT INDICATED    NOT INDICATED   RMSF IgG No range found    0.09     CBC w Diff: Lab Results  Component Value Date   WBC 9.6 08/03/2012   HGB 11.3* 08/03/2012   HCT 33.4* 08/03/2012   PLT 180 08/03/2012   LYMPHOPCT 12 08/02/2012   MONOPCT 8 08/02/2012    EOSPCT 1 08/02/2012   BASOPCT 1 08/02/2012    CMP: Lab Results  Component Value Date   NA 135 08/03/2012   K 3.5 08/03/2012   CL 102 08/03/2012   CO2 23 08/03/2012   BUN 9 08/03/2012   CREATININE 0.60 08/03/2012  PROT 6.3 08/03/2012   ALBUMIN 3.0* 08/03/2012   BILITOT 0.3 08/03/2012   ALKPHOS 50 08/03/2012   AST 20 08/03/2012   ALT 9 08/03/2012  .   Discharge Instructions     Follow with Primary MD Lilyan Punt, MD in 2 days , please get final CSF culture results reviewed by your family doctor and the infectious disease doctor on the next visit.  Get CBC, CMP, checked 2 days by Primary MD and again as instructed by your Primary MD.    Get Medicines reviewed and adjusted.  Please request your Prim.MD to go over all Hospital Tests and Procedure/Radiological results at the follow up, please get all Hospital records sent to your Prim MD by signing hospital release before you go home.  Activity: As tolerated with Full fall precautions use Holdren/cane & assistance as needed   Diet:  Heart healthy  For Heart failure patients - Check your Weight same time everyday, if you gain over 2 pounds, or you develop in leg swelling, experience more shortness of breath or chest pain, call your Primary MD immediately. Follow Cardiac Low Salt Diet and 1.8 lit/day fluid restriction.  Disposition Home   If you experience worsening of your admission symptoms, develop shortness of breath, life threatening emergency, suicidal or homicidal thoughts you must seek medical attention immediately by calling 911 or calling your MD immediately  if symptoms less severe.  You Must read complete instructions/literature along with all the possible adverse reactions/side effects for all the Medicines you take and that have been prescribed to you. Take any new Medicines after you have completely understood and accpet all the possible adverse reactions/side effects.   Do not drive and provide baby sitting  services if your were admitted for syncope or siezures until you have seen by Primary MD or a Neurologist and advised to do so again.  Do not drive when taking Pain medications.    Do not take more than prescribed Pain, Sleep and Anxiety Medications  Special Instructions: If you have smoked or chewed Tobacco  in the last 2 yrs please stop smoking, stop any regular Alcohol  and or any Recreational drug use.  Wear Seat belts while driving.    Viral Meningitis Meningitis is an inflammation of the fluid and membranes surrounding the spinal cord and brain. Viral meningitis is caused by a viral infection. It is important to know whether a virus or bacterium is causing your meningitis. Viral meningitis is generally less severe than bacterial meningitis. Aseptic meningitis is any meningitis not caused by a bacterial infection, including viral meningitis. Meningitis can also be caused by fungi, parasites, certain medicines, cancer, and autoimmune disorders. Uncomplicated meningitis usually resolves on its own in 7 to 10 days.However, there can be complicated cases that last much longer. People with lowered immune systems are at greater risk for poor outcomes from meningitis.It is important to get treatment as soon as possible to minimize the impact of a meningitis infection. Long-term complications could include seizures, hearing loss, weakness, paralysis, blindness, or cognitive impairment. CAUSES Most cases of meningitis are due to viruses. Viruses that cause meningitis include enteroviruses (such as polio and coxsackie viruses), herpes simplex virus, varicella zoster virus, mumps, and HIV. SYMPTOMS  Symptoms can develop over many hours. They may even take a few days to develop. Common symptoms of meningitis in people over the age of 2 years include:  High fever.  Headache.  Stiff neck.  Irritability.  Nausea and vomiting.  Fatigue.  Discomfort from  exposure to light.  Discomfort from  exposure to loud noise.  Trouble walking.  Altered mental status.  Seizures. DIAGNOSIS  Early diagnosis and treatment are very important. If you have symptoms, you should see your caregiver right away. Your evaluation may include lab tests of your blood and spinal fluid. A spinal fluid sample is taken through a procedure called a lumbar puncture or spinal tap. During the procedure, a needle is inserted into an area in the lower back. You may also have a CT scan of your brain as part of your evaluation.If there is suspicion of an infection or inflammation of the brain (encephalitis), an MRI scan may be done. TREATMENT   Antibiotics are not effective against a virus. Antiviral medicines may be used depending on the cause of your meningitis.  Treatment for viral meningitis focuses on reducing your symptoms. This may include rest, hydration, and medicines to reduce fever and pain.  Steroids may also be used if there is significant swelling of the brain. PREVENTION  Vaccines can help prevent meningitis caused by polio, measles, and mumps.  Strict hand washing is effective in controlling the spread of many causes of meningitis.  Using barrier protection during sexual intercourse can prevent the spread of meningitis caused by viruses such as the herpes virus or HIV.  Protection from mosquitoes, especially for the very young and very old, can prevent some causes of meningitis. HOME CARE INSTRUCTIONS  Rest.  Drink enough water and fluids to keep your urine clear or pale yellow.  Take all medicine as prescribed.  Practice good hygiene to prevent others from getting sick.  Follow up with your caregiver as directed. SEEK IMMEDIATE MEDICAL CARE IF:  You develop dizziness.  You have a very fast heartbeat.  You have difficulty breathing.  You develop confusion.  You have seizures.  You have unstoppable nausea and vomiting.  You develop any worsening symptoms. MAKE SURE  YOU:  Understand these instructions.  Will watch your condition.  Will get help right away if you are not doing well or get worse. Document Released: 09/24/2002 Document Revised: 12/27/2011 Document Reviewed: 01/19/2011 [facility] Patient Information 2013 [place], [place].   Follow-up Information    Follow up with Judyann Munson, MD. Schedule an appointment as soon as possible for a visit in 3 days.   Contact information:   [address] [place] [address] [phone]       Follow up with [name],[name], MD. Schedule an appointment as soon as possible for a visit in 2 days.   Contact information:   [address] [place] [address] [phone]       Follow up with CRAM,GARY P, MD. Schedule an appointment as soon as possible for a visit in 2 weeks.   Contact information:   [address] [place] [address] [phone]            Discharge Medications     Medication List     As of 08/05/2012  9:44 AM    START taking these medications         doxycycline 100 MG tablet   Commonly known as: VIBRA-TABS   Take 1 tablet (100 mg total) by mouth every 12 (twelve) hours.      HYDROcodone-acetaminophen 5-325 MG per tablet   Commonly known as: NORCO/VICODIN   Take 1 tablet by mouth every 6 (six) hours as needed for pain.      ibuprofen 600 MG tablet  Commonly known as: ADVIL,MOTRIN   Take 1 tablet (600 mg total) by mouth every 8 (eight) hours as needed for pain.      ondansetron 4 MG tablet   Commonly known as: ZOFRAN   Take 1 tablet (4 mg total) by mouth every 6 (six) hours as needed for nausea.      CONTINUE taking these medications         fluticasone 50 MCG/ACT nasal spray   Commonly known as: FLONASE      niacin 1000 MG CR tablet   Commonly known as: NIASPAN      omeprazole 20 MG capsule   Commonly known as: PRILOSEC      pravastatin 40 MG tablet   Commonly known as: PRAVACHOL      ramipril 10 MG capsule    Commonly known as: ALTACE      STOP taking these medications         cefPROZIL 500 MG tablet   Commonly known as: CEFZIL          Where to get your medications    These are the prescriptions that you need to pick up.   You may get these medications from any pharmacy.         doxycycline 100 MG tablet   HYDROcodone-acetaminophen 5-325 MG per tablet   ibuprofen 600 MG tablet   ondansetron 4 MG tablet               Total Time in preparing paper work, data evaluation and todays exam - 35 minutes  Leroy Sea M.D on 08/05/2012 at 9:44 AM  [facility] Office  [phone]

## 2012-08-06 ENCOUNTER — Telehealth: Payer: Self-pay | Admitting: Infectious Disease

## 2012-08-06 DIAGNOSIS — G039 Meningitis, unspecified: Secondary | ICD-10-CM

## 2012-08-06 NOTE — Telephone Encounter (Signed)
I am trying to add a CSF culture on this pt who had lymphocytic meningitis but no cultures done. It would be ususual to be bacterial with such a lymph predominance but will see if we can set up culture and incubate for 7 days

## 2012-08-07 ENCOUNTER — Encounter (HOSPITAL_COMMUNITY): Payer: Self-pay | Admitting: Emergency Medicine

## 2012-08-07 LAB — ROCKY MTN SPOTTED FVR AB, IGM-BLOOD: RMSF IgM: 1.97 IV (ref 0.00–0.89)

## 2012-08-07 NOTE — Progress Notes (Signed)
RMSF Igm +ve D/W Dr Luciana Axe total 7 day of Po Doxy to suffice, pt already much better before DC.

## 2012-08-07 NOTE — Progress Notes (Signed)
Lab called Critical Lab value of [facility] Spotted Fever of 1.97.  Dr. Thedore Mins called to inform.  Patient already discharged from hospital.

## 2012-08-13 LAB — CSF CULTURE W GRAM STAIN

## 2012-08-17 LAB — ARBOVIRUS PANEL, ~~LOC~~ LAB

## 2012-12-25 ENCOUNTER — Ambulatory Visit (INDEPENDENT_AMBULATORY_CARE_PROVIDER_SITE_OTHER): Payer: Medicare Other | Admitting: Internal Medicine

## 2012-12-25 ENCOUNTER — Encounter (INDEPENDENT_AMBULATORY_CARE_PROVIDER_SITE_OTHER): Payer: Self-pay | Admitting: Internal Medicine

## 2012-12-25 ENCOUNTER — Telehealth (INDEPENDENT_AMBULATORY_CARE_PROVIDER_SITE_OTHER): Payer: Self-pay | Admitting: *Deleted

## 2012-12-25 ENCOUNTER — Other Ambulatory Visit (INDEPENDENT_AMBULATORY_CARE_PROVIDER_SITE_OTHER): Payer: Self-pay | Admitting: *Deleted

## 2012-12-25 VITALS — BP 138/62 | HR 64 | Ht 68.0 in | Wt 150.2 lb

## 2012-12-25 DIAGNOSIS — Z1211 Encounter for screening for malignant neoplasm of colon: Secondary | ICD-10-CM

## 2012-12-25 DIAGNOSIS — R1319 Other dysphagia: Secondary | ICD-10-CM

## 2012-12-25 DIAGNOSIS — R1310 Dysphagia, unspecified: Secondary | ICD-10-CM

## 2012-12-25 NOTE — Telephone Encounter (Signed)
Patient needs movi prep 

## 2012-12-25 NOTE — Patient Instructions (Signed)
EGD/ED. Colonoscopy.

## 2012-12-25 NOTE — Progress Notes (Signed)
Subjective:     Patient ID: Jason Spence, male   DOB: 1941-03-24, 72 y.o.   MRN: [id number]  HPI Referred to our office for dysphagia. Saw Dr. Gerda Diss last month and c/o dysphagia. He is having problems swallowing pills. He has a hx of pill dysphagia and has an EGD/ED in the past.  EGD/Ed in 2010 for esophageal dysphagia. Erosive reflux esophagitis with stricture involving the GE junction. Small sliding hiatal hernia. Erosive antral gastritis and erosive bulbar duodenitis. Esophageal stricture dilated to 18mm with a balloon.  Sometimes foods will lodge.  Fish will lodge. Appetite is good. No weight loss. No abdominal pain.  Acid reflux is controlled with Prilosec. BMs are normal .Usually has a BM x 2 in the am. No melena or bright red rectal bleeding.  His last colonoscopy in 2004 in [place] at [facility] and was normal per patient.    Review of Systems see hpi Current Outpatient Prescriptions  Medication Sig Dispense Refill  . aspirin 81 MG tablet Take 81 mg by mouth 2 (two) times daily before a meal.       . fexofenadine (ALLEGRA) 30 MG tablet Take 30 mg by mouth as needed.       . fish oil-omega-3 fatty acids 1000 MG capsule Take 1 g by mouth daily.       . fluticasone (FLONASE) 50 MCG/ACT nasal spray Place 2 sprays into the nose daily.      Marland Kitchen ibuprofen (MOTRIN IB) 600 MG tablet Take 1 tablet (600 mg total) by mouth every 8 (eight) hours as needed for pain.  20 tablet  0  . Multiple Vitamin (MULTIVITAMIN WITH MINERALS) TABS Take 1 tablet by mouth daily.      Marland Kitchen omeprazole (PRILOSEC) 20 MG capsule Take 20 mg by mouth daily as needed. Acid reflux      . pravastatin (PRAVACHOL) 40 MG tablet Take 40 mg by mouth daily.      . ramipril (ALTACE) 10 MG capsule Take 10 mg by mouth daily.       No current facility-administered medications for this visit.   Past Medical History  Diagnosis Date  . Hypercholesteremia   . Hypertension   . Arthritis   . Myocardial infarct    Past  Surgical History  Procedure Laterality Date  . Hernia repair    . Coronary artery bypass graft     No Known Allergies      Objective:   Physical Exam  Filed Vitals:   12/25/12 1537  BP: 138/62  Pulse: 64  Height: 5\' 8"  (1.727 m)  Weight: 150 lb 3.2 oz (68.13 kg)   Alert and oriented. Skin warm and dry. Oral mucosa is moist.   . Sclera anicteric, conjunctivae is pink. Thyroid not enlarged. No cervical lymphadenopathy. Lungs clear. Heart regular rate and rhythm.  Abdomen is soft. Bowel sounds are positive. No hepatomegaly. No abdominal masses felt. No tenderness.  No edema to lower extremities.       Assessment:    Pill and solid food dysphagia. Hx of same.                                               In need of screening colonoscopy Plan:    EGD/ED, Screening colonoscopy

## 2012-12-26 DIAGNOSIS — R1319 Other dysphagia: Secondary | ICD-10-CM | POA: Insufficient documentation

## 2012-12-26 DIAGNOSIS — Z1211 Encounter for screening for malignant neoplasm of colon: Secondary | ICD-10-CM | POA: Insufficient documentation

## 2012-12-26 MED ORDER — PEG-KCL-NACL-NASULF-NA ASC-C 100 G PO SOLR: 1.0000 | Freq: Once | ORAL | Status: DC

## 2012-12-27 ENCOUNTER — Encounter (HOSPITAL_COMMUNITY): Payer: Self-pay | Admitting: Pharmacy Technician

## 2013-01-02 ENCOUNTER — Encounter (INDEPENDENT_AMBULATORY_CARE_PROVIDER_SITE_OTHER): Payer: Self-pay

## 2013-01-05 ENCOUNTER — Encounter (HOSPITAL_COMMUNITY)
Admission: RE | Disposition: A | Discharge status: Home / Self Care | Payer: Self-pay | Source: Ambulatory Visit | Attending: Internal Medicine

## 2013-01-05 ENCOUNTER — Encounter (HOSPITAL_COMMUNITY): Payer: Self-pay | Admitting: *Deleted

## 2013-01-05 ENCOUNTER — Ambulatory Visit (HOSPITAL_COMMUNITY)
Admission: RE | Admit: 2013-01-05 | Discharge: 2013-01-05 | Disposition: A | Discharge status: Home / Self Care | Payer: Medicare Other | Source: Ambulatory Visit | Attending: Internal Medicine | Admitting: Internal Medicine

## 2013-01-05 DIAGNOSIS — K449 Diaphragmatic hernia without obstruction or gangrene: Secondary | ICD-10-CM | POA: Insufficient documentation

## 2013-01-05 DIAGNOSIS — K222 Esophageal obstruction: Secondary | ICD-10-CM

## 2013-01-05 DIAGNOSIS — D126 Benign neoplasm of colon, unspecified: Secondary | ICD-10-CM | POA: Insufficient documentation

## 2013-01-05 DIAGNOSIS — R1310 Dysphagia, unspecified: Secondary | ICD-10-CM | POA: Insufficient documentation

## 2013-01-05 DIAGNOSIS — Z1211 Encounter for screening for malignant neoplasm of colon: Secondary | ICD-10-CM

## 2013-01-05 DIAGNOSIS — E7800 Pure hypercholesterolemia, unspecified: Secondary | ICD-10-CM | POA: Insufficient documentation

## 2013-01-05 DIAGNOSIS — I10 Essential (primary) hypertension: Secondary | ICD-10-CM | POA: Insufficient documentation

## 2013-01-05 DIAGNOSIS — K644 Residual hemorrhoidal skin tags: Secondary | ICD-10-CM | POA: Insufficient documentation

## 2013-01-05 DIAGNOSIS — K624 Stenosis of anus and rectum: Secondary | ICD-10-CM

## 2013-01-05 DIAGNOSIS — K5730 Diverticulosis of large intestine without perforation or abscess without bleeding: Secondary | ICD-10-CM

## 2013-01-05 HISTORY — PX: BALLOON DILATION: SHX5330

## 2013-01-05 HISTORY — PX: COLONOSCOPY WITH ESOPHAGOGASTRODUODENOSCOPY (EGD): SHX5779

## 2013-01-05 SURGERY — COLONOSCOPY WITH ESOPHAGOGASTRODUODENOSCOPY (EGD)
Anesthesia: Moderate Sedation

## 2013-01-05 MED ORDER — MIDAZOLAM HCL 5 MG/5ML IJ SOLN
INTRAMUSCULAR | Status: AC
  Filled 2013-01-05: qty 10

## 2013-01-05 MED ORDER — MEPERIDINE HCL 50 MG/ML IJ SOLN
INTRAMUSCULAR | Status: DC | PRN
  Administered 2013-01-05 (×2): 25 mg via INTRAVENOUS

## 2013-01-05 MED ORDER — SODIUM CHLORIDE 0.45 % IV SOLN: INTRAVENOUS | Status: DC

## 2013-01-05 MED ORDER — BUTAMBEN-TETRACAINE-BENZOCAINE 2-2-14 % EX AERO
INHALATION_SPRAY | CUTANEOUS | Status: DC | PRN
  Administered 2013-01-05: 1 via TOPICAL

## 2013-01-05 MED ORDER — SODIUM CHLORIDE 0.9 % IV SOLN
Freq: Once | INTRAVENOUS | Status: AC
  Administered 2013-01-05: 1000 mL via INTRAVENOUS

## 2013-01-05 MED ORDER — MEPERIDINE HCL 50 MG/ML IJ SOLN
INTRAMUSCULAR | Status: AC
  Filled 2013-01-05: qty 1

## 2013-01-05 MED ORDER — MIDAZOLAM HCL 5 MG/5ML IJ SOLN
INTRAMUSCULAR | Status: DC | PRN
  Administered 2013-01-05: 2 mg via INTRAVENOUS
  Administered 2013-01-05 (×3): 1 mg via INTRAVENOUS
  Administered 2013-01-05: 2 mg via INTRAVENOUS

## 2013-01-05 NOTE — Progress Notes (Signed)
Pt states  Fell yesterday on a walk has a small  contusion on right knee and his right thumb is bruised and swollen but has good range of motion, pain level 5-6 with movement. Instructed to inform Dr. Karilyn Cota

## 2013-01-05 NOTE — H&P (Addendum)
Jason Spence is an 72 y.o. male.   Chief Complaint: Patient here for EGD, ED and colonoscopy. HPI: Asian is 72 year old Caucasian male who presents with 3 month history of intermittent solid food dysphagia. He has undergone esophageal dilation in the past most recently about half years ago. His heartburn is well controlled with therapy. He does not have any difficulty with liquids. He also has difficulty with large pills. His weight has been stable. He denies rectal bleeding change in his bowel habits or abdominal pain. His last colonoscopy was 10 years ago . He fell yesterday while walking and control his fall with both hands. He is developed swelling to his right hand around thenar eminence  Past Medical History  Diagnosis Date  . Hypercholesteremia   . Hypertension   . Arthritis   . Myocardial infarct     Past Surgical History  Procedure Laterality Date  . Hernia repair    . Coronary artery bypass graft      History reviewed. No pertinent family history. Social History:  reports that he quit smoking about 17 years ago. He does not have any smokeless tobacco history on file. He reports that he does not drink alcohol. His drug history is not on file.  Allergies: No Known Allergies  Medications Prior to Admission  Medication Sig Dispense Refill  . aspirin 81 MG tablet Take 81 mg by mouth 2 (two) times daily before a meal.       . fish oil-omega-3 fatty acids 1000 MG capsule Take 1 g by mouth daily.       . fluticasone (FLONASE) 50 MCG/ACT nasal spray Place 2 sprays into the nose daily.      Marland Kitchen ibuprofen (MOTRIN IB) 600 MG tablet Take 1 tablet (600 mg total) by mouth every 8 (eight) hours as needed for pain.  20 tablet  0  . meloxicam (MOBIC) 7.5 MG tablet Take 1 tablet by mouth 2 (two) times daily.      . Multiple Vitamin (MULTIVITAMIN WITH MINERALS) TABS Take 1 tablet by mouth daily.      Marland Kitchen omeprazole (PRILOSEC) 20 MG capsule Take 20 mg by mouth daily as needed. Acid reflux       . peg 3350 powder (MOVIPREP) 100 G SOLR Take 1 kit (100 g total) by mouth once.  1 kit  0  . pravastatin (PRAVACHOL) 40 MG tablet Take 40 mg by mouth daily.      . ramipril (ALTACE) 10 MG capsule Take 10 mg by mouth daily.      . fexofenadine (ALLEGRA) 30 MG tablet Take 30 mg by mouth as needed.         No results found for this or any previous visit (from the past 48 hour(s)). No results found.  ROS  Blood pressure 155/56, pulse 57, temperature 98.4 F (36.9 C), temperature source Oral, resp. rate 18, height 5\' 8"  (1.727 m), weight 150 lb (68.04 kg), SpO2 98.00%. Physical Exam  Constitutional:  Well-developed thin Caucasian male in NAD.  HENT:  Mouth/Throat: Oropharynx is clear and moist.  Eyes: Conjunctivae are normal. No scleral icterus.  Neck: No thyromegaly present.  Cardiovascular: Normal rate, regular rhythm and normal heart sounds.   Murmur: faint SEM at LLSB. Respiratory: Effort normal and breath sounds normal.  GI: Soft. He exhibits no distension and no mass. There is no tenderness.  Musculoskeletal: He exhibits no edema.  Swollen right thenar eminence with bluish discoloration  Lymphadenopathy:    He has no  cervical adenopathy.  Neurological: He is alert.  Skin: Skin is warm and dry.     Assessment/Plan Solid food dysphagia. EGD, ED and average risk screening colonoscopy.Marland Kitchen  Mehkai Gallo U 01/05/2013, 12:56 PM

## 2013-01-05 NOTE — Op Note (Signed)
EGD PROCEDURE REPORT  PATIENT:  Jason Spence  MR#:  [id number] Birthdate:  1940-11-11, 72 y.o., male Endoscopist:  Dr. Malissa Hippo, MD Referred By:  Dr. Lilyan Punt, MD Procedure Date: 01/05/2013  Procedure:   EGD, ED & Colonoscopy.  Indications:  Patient is 72 year old Caucasian male was chronic GERD complicated by esophageal stricture last dilated in November 2010 who presents with dysphagia with solids and large pills. He is also undergoing average risk screening colonoscopy. His last exam was 10 years ago.            Informed Consent:  The risks, benefits, alternatives & imponderables which include, but are not limited to, bleeding, infection, perforation, drug reaction and potential missed lesion have been reviewed.  The potential for biopsy, lesion removal, esophageal dilation, etc. have also been discussed.  Questions have been answered.  All parties agreeable.  Please see history & physical in medical record for more information.  Medications:  Demerol 50 mg IV Versed  7 mg IV Cetacaine spray topically for oropharyngeal anesthesia  EGD  Description of procedure:  The endoscope was introduced through the mouth and advanced to the second portion of the duodenum without difficulty or limitations. The mucosal surfaces were surveyed very carefully during advancement of the scope and upon withdrawal.  Findings:  Esophagus:  Mucosa of the esophagus was normal except high-grade stricture noted at GE junction. Stricture was initially dilated with a scope and subsequently with a balloon dilator as below. GEJ:  38 cm Hiatus:  40 cm Stomach:  Stomach was empty and distended very well with insufflation. Folds in the proximal stomach were normal. Examination of mucosa at body, antrum, pyloric channel, angularis, fundus and cardia was normal. Duodenum:  Normal bulbar and post bulbar mucosa.  Therapeutic/Diagnostic Maneuvers Performed:   Distal esophageal stricture was dilated with  balloon dilator. Dilator was passed of the scope. Guidewire was pushed into gastric lumen. Dilator was positioned across the stricture but withdrawing scope into body of the esophagus. The stricture was dilated to 15 mm, 16.5 and finally to 18 mm resulting in 13 mm long and narrow mucosal disruption. Balloon dilator was then withdrawn. I was able to pass the scope across the stricture without any resistance. The scope was then withdrawn and patient appear for procedure #2.  COLONOSCOPY Description of procedure:  After a digital rectal exam was performed, that colonoscope was advanced from the anus through the rectum and colon to the area of the cecum, ileocecal valve and appendiceal orifice. The cecum was deeply intubated. These structures were well-seen and photographed for the record. From the level of the cecum and ileocecal valve, the scope was slowly and cautiously withdrawn. The mucosal surfaces were carefully surveyed utilizing scope tip to flexion to facilitate fold flattening as needed. The scope was pulled down into the rectum where a thorough exam including retroflexion was performed.  Findings:   Prep excellent. Scattered diverticula throughout the colon but predominantly at sigmoid colon. Small polyp ablated via cold biopsy from transverse colon. Normal rectum mucosa. Small hemorrhoids below the dentate line along with small papillae. Anal stenosis.  Therapeutic/Diagnostic Maneuvers Performed:  See above  Complications:  None  Cecal Withdrawal Time:  15 minutes  Impression:  Distal esophageal stricture dilated with a balloon to 18 mm. Small sliding hiatal hernia. Pancolonic diverticulosis. Small polyp ablated via cold biopsy from transverse colon. Small external hemorrhoids. Anal stenosis.  Recommendations:  Continue anti-reflux measures and omeprazole as before. High fiber diet. Patient advised  to discontinue meloxicam while on ibuprofen. I will contact patient with  biopsy results.  Dodi Leu U  01/05/2013 1:52 PM  CC: Dr. Lilyan Punt, MD & Dr. Bonnetta Barry ref. provider found

## 2013-01-09 ENCOUNTER — Encounter (HOSPITAL_COMMUNITY): Payer: Self-pay | Admitting: Internal Medicine

## 2013-01-22 ENCOUNTER — Encounter (INDEPENDENT_AMBULATORY_CARE_PROVIDER_SITE_OTHER): Payer: Self-pay | Admitting: *Deleted

## 2013-03-13 ENCOUNTER — Encounter: Payer: Self-pay | Admitting: *Deleted

## 2013-03-15 ENCOUNTER — Encounter: Payer: Self-pay | Admitting: Family Medicine

## 2013-03-15 ENCOUNTER — Ambulatory Visit (INDEPENDENT_AMBULATORY_CARE_PROVIDER_SITE_OTHER): Payer: Medicare Other | Admitting: Family Medicine

## 2013-03-15 VITALS — BP 180/90 | HR 70 | Ht 66.0 in | Wt 146.2 lb

## 2013-03-15 DIAGNOSIS — I10 Essential (primary) hypertension: Secondary | ICD-10-CM | POA: Insufficient documentation

## 2013-03-15 NOTE — Progress Notes (Signed)
  Subjective:    Patient ID: Jason Spence, male    DOB: December 25, 1940, 72 y.o.   MRN: [id number]  Hyperlipidemia This is a chronic problem. The current episode started more than 1 year ago. The problem is controlled. Recent lipid tests were reviewed and are normal. There are no known factors aggravating his hyperlipidemia. Pertinent negatives include no chest pain. Treatments tried: pravastatin. The current treatment provides moderate improvement of lipids. There are no compliance problems.    No chest pain no fever or cough Pmh, fmh reviewed  Review of Systems  Constitutional: Negative for appetite change and fatigue.  HENT: Negative for congestion and rhinorrhea.   Respiratory: Negative for cough and chest tightness.   Cardiovascular: Negative for chest pain and leg swelling.  Gastrointestinal: Negative for abdominal pain.       Objective:   Physical Exam  Constitutional: He appears well-developed.  HENT:  Head: Normocephalic.  Neck: Normal range of motion.  Cardiovascular: Normal rate, regular rhythm and normal heart sounds.   Pulmonary/Chest: Effort normal and breath sounds normal.  Musculoskeletal: He exhibits no edema.  Lymphadenopathy:    He has no cervical adenopathy.          Assessment & Plan:  No diagnosis found. HTN- mildly elevated, cut down on coffee, increase walking, check bp at home, bring cuff here, f/u 3 weeks

## 2013-03-15 NOTE — Patient Instructions (Addendum)
Reduce coffee one cup of caffiene per am  Check BP 2 times a week   Follow up here in 3 weeks and bring your BP cuff  Goal 130's over 80's or less  Keep all meds as is  Increase walking

## 2013-04-05 ENCOUNTER — Encounter: Payer: Self-pay | Admitting: Family Medicine

## 2013-04-05 ENCOUNTER — Ambulatory Visit (INDEPENDENT_AMBULATORY_CARE_PROVIDER_SITE_OTHER): Payer: Medicare Other | Admitting: Family Medicine

## 2013-04-05 VITALS — BP 166/90 | HR 70 | Ht 65.75 in | Wt 145.0 lb

## 2013-04-05 DIAGNOSIS — I10 Essential (primary) hypertension: Secondary | ICD-10-CM

## 2013-04-05 NOTE — Progress Notes (Signed)
  Subjective:    Patient ID: Jason Spence, male    DOB: 27-Feb-1941, 72 y.o.   MRN: [id number]  Hypertension This is a chronic problem. The current episode started more than 1 year ago. The problem is unchanged. The problem is controlled. There are no associated agents to hypertension. There are no known risk factors for coronary artery disease. Treatments tried: ramipril. The current treatment provides mild improvement. There are no compliance problems.   Patient is here for follow up on elevated blood pressure at last office visit.  Review of Systems     Objective:   Physical Exam  Vitals reviewed. Constitutional: He appears well-developed.  HENT:  Head: Normocephalic.  Cardiovascular: Normal rate.   Pulmonary/Chest: Effort normal and breath sounds normal.          Assessment & Plan:  Hypertension-20 minutes spent with patient discussing diet exercise. Blood pressure was taken with his machine and with cars. His overestimates by about 6 points on both systolic and diastolic I recommend him to only Blood pressure about 3 days a week also recommend that he followup in approximately 3-4 months time for a wellness exam and see a blood pressure is doing may have to adjust medicines then

## 2013-04-05 NOTE — Patient Instructions (Signed)
DASH Diet  The DASH diet stands for "Dietary Approaches to Stop Hypertension." It is a healthy eating plan that has been shown to reduce high blood pressure (hypertension) in as little as 14 days, while also possibly providing other significant health benefits. These other health benefits include reducing the risk of breast cancer after menopause and reducing the risk of type 2 diabetes, heart disease, colon cancer, and stroke. Health benefits also include weight loss and slowing kidney failure in patients with chronic kidney disease.   DIET GUIDELINES  · Limit salt (sodium). Your diet should contain less than 1500 mg of sodium daily.  · Limit refined or processed carbohydrates. Your diet should include mostly whole grains. Desserts and added sugars should be used sparingly.  · Include small amounts of heart-healthy fats. These types of fats include nuts, oils, and tub margarine. Limit saturated and trans fats. These fats have been shown to be harmful in the body.  CHOOSING FOODS   The following food groups are based on a 2000 calorie diet. See your Registered Dietitian for individual calorie needs.  Grains and Grain Products (6 to 8 servings daily)  · Eat More Often: Whole-wheat bread, brown rice, whole-grain or wheat pasta, quinoa, popcorn without added fat or salt (air popped).  · Eat Less Often: White bread, white pasta, white rice, cornbread.  Vegetables (4 to 5 servings daily)  · Eat More Often: Fresh, frozen, and canned vegetables. Vegetables may be raw, steamed, roasted, or grilled with a minimal amount of fat.  · Eat Less Often/Avoid: Creamed or fried vegetables. Vegetables in a cheese sauce.  Fruit (4 to 5 servings daily)  · Eat More Often: All fresh, canned (in natural juice), or frozen fruits. Dried fruits without added sugar. One hundred percent fruit juice (½ cup [237 mL] daily).  · Eat Less Often: Dried fruits with added sugar. Canned fruit in light or heavy syrup.  Lean Meats, Fish, and Poultry (2  servings or less daily. One serving is 3 to 4 oz [85-114 g]).  · Eat More Often: Ninety percent or leaner ground beef, tenderloin, sirloin. Round cuts of beef, chicken breast, turkey breast. All fish. Grill, bake, or broil your meat. Nothing should be fried.  · Eat Less Often/Avoid: Fatty cuts of meat, turkey, or chicken leg, thigh, or wing. Fried cuts of meat or fish.  Dairy (2 to 3 servings)  · Eat More Often: Low-fat or fat-free milk, low-fat plain or light yogurt, reduced-fat or part-skim cheese.  · Eat Less Often/Avoid: Milk (whole, 2%). Whole milk yogurt. Full-fat cheeses.  Nuts, Seeds, and Legumes (4 to 5 servings per week)  · Eat More Often: All without added salt.  · Eat Less Often/Avoid: Salted nuts and seeds, canned beans with added salt.  Fats and Sweets (limited)  · Eat More Often: Vegetable oils, tub margarines without trans fats, sugar-free gelatin. Mayonnaise and salad dressings.  · Eat Less Often/Avoid: Coconut oils, palm oils, butter, stick margarine, cream, half and half, cookies, candy, pie.  FOR MORE INFORMATION  The Dash Diet Eating Plan: www.dashdiet.org  Document Released: 09/23/2011 Document Revised: 12/27/2011 Document Reviewed: 09/23/2011  [employer]® Patient Information ©2014 [employer].

## 2013-05-09 ENCOUNTER — Telehealth: Payer: Self-pay | Admitting: Family Medicine

## 2013-05-09 NOTE — Telephone Encounter (Signed)
Patient left one month of BP readings for Dr. Lorin Picket.  States to call if he needs to change anything.  Thanks

## 2013-05-09 NOTE — Telephone Encounter (Signed)
His blood pressure readings overall looked good he may reduce the frequency of checking his blood pressure I recommend that he followup again in 3 months as planned. Keep all as is

## 2013-05-09 NOTE — Telephone Encounter (Signed)
Discussed with patient

## 2013-06-11 ENCOUNTER — Telehealth: Payer: Self-pay | Admitting: Family Medicine

## 2013-06-11 NOTE — Telephone Encounter (Signed)
Pt has appt 07/12/13 @ 8:10am for Physical, he needs his lab work papers.  Please call when ready to pick up so that he may get them done a week before his appt.

## 2013-06-13 NOTE — Telephone Encounter (Signed)
Lipid, liver, metabolic 7, PSA 

## 2013-06-14 ENCOUNTER — Other Ambulatory Visit: Payer: Self-pay

## 2013-06-14 DIAGNOSIS — Z125 Encounter for screening for malignant neoplasm of prostate: Secondary | ICD-10-CM

## 2013-06-14 DIAGNOSIS — Z79899 Other long term (current) drug therapy: Secondary | ICD-10-CM

## 2013-06-14 DIAGNOSIS — E785 Hyperlipidemia, unspecified: Secondary | ICD-10-CM

## 2013-06-14 NOTE — Telephone Encounter (Signed)
Patient was notified blood papers are ready at front desk.

## 2013-07-04 LAB — LIPID PANEL
HDL: 38 mg/dL — ABNORMAL LOW (ref >39)
LDL Cholesterol: 54 mg/dL (ref 0–99)

## 2013-07-04 LAB — HEPATIC FUNCTION PANEL
AST: 25 U/L (ref 0–37)
Alkaline Phosphatase: 61 U/L (ref 39–117)
Indirect Bilirubin: 0.5 mg/dL (ref 0.0–0.9)
Total Bilirubin: 0.6 mg/dL (ref 0.3–1.2)
Total Protein: 7.6 g/dL (ref 6.0–8.3)

## 2013-07-04 LAB — BASIC METABOLIC PANEL
CO2: 31 mEq/L (ref 19–32)
Calcium: 9.4 mg/dL (ref 8.4–10.5)
Glucose, Bld: 95 mg/dL (ref 70–99)
Potassium: 5 mEq/L (ref 3.5–5.3)
Sodium: 140 mEq/L (ref 135–145)

## 2013-07-12 ENCOUNTER — Ambulatory Visit (INDEPENDENT_AMBULATORY_CARE_PROVIDER_SITE_OTHER): Payer: Medicare Other | Admitting: Family Medicine

## 2013-07-12 ENCOUNTER — Encounter: Payer: Self-pay | Admitting: Family Medicine

## 2013-07-12 VITALS — BP 122/68 | Ht 68.0 in | Wt 145.4 lb

## 2013-07-12 DIAGNOSIS — Z0000 Encounter for general adult medical examination without abnormal findings: Secondary | ICD-10-CM

## 2013-07-12 DIAGNOSIS — Z23 Encounter for immunization: Secondary | ICD-10-CM

## 2013-07-12 MED ORDER — FLUTICASONE PROPIONATE 50 MCG/ACT NA SUSP: 2.0000 | Freq: Every day | NASAL | Status: DC

## 2013-07-12 MED ORDER — RAMIPRIL 10 MG PO CAPS: 10.0000 mg | ORAL_CAPSULE | Freq: Every day | ORAL | Status: DC

## 2013-07-12 MED ORDER — PRAVASTATIN SODIUM 40 MG PO TABS: 40.0000 mg | ORAL_TABLET | Freq: Every day | ORAL | Status: DC

## 2013-07-12 MED ORDER — OMEPRAZOLE 20 MG PO CPDR: 20.0000 mg | DELAYED_RELEASE_CAPSULE | Freq: Every day | ORAL | Status: DC | PRN

## 2013-07-12 NOTE — Patient Instructions (Addendum)
Thank you for coming for your annual wellness visit.  Please follow through on any advice that was given to you by today's visit. Remember to maintain compliance with your medications as discussed today.  Also remember it is important to eat a healthy diet and to stay physically active on a daily basis.  Please follow through with any testing or recommended followup office visits as was discussed today. You are due the following test coming up:  ALL- Colonoscopy-2014           Vaccines-Flu yearly    Males- Prostate Exam_yearly     Finally remembered that the annual wellness visit does not take the place of regularly scheduled office visits  chronic health problems such as hypertension/diabetes/cholesterol visits.   DASH Diet The DASH diet stands for "Dietary Approaches to Stop Hypertension." It is a healthy eating plan that has been shown to reduce high blood pressure (hypertension) in as little as 14 days, while also possibly providing other significant health benefits. These other health benefits include reducing the risk of breast cancer after menopause and reducing the risk of type 2 diabetes, heart disease, colon cancer, and stroke. Health benefits also include weight loss and slowing kidney failure in patients with chronic kidney disease.  DIET GUIDELINES  Limit salt (sodium). Your diet should contain less than 1500 mg of sodium daily.  Limit refined or processed carbohydrates. Your diet should include mostly whole grains. Desserts and added sugars should be used sparingly.  Include small amounts of heart-healthy fats. These types of fats include nuts, oils, and tub margarine. Limit saturated and trans fats. These fats have been shown to be harmful in the body. CHOOSING FOODS  The following food groups are based on a 2000 calorie diet. See your Registered Dietitian for individual calorie needs. Grains and Grain Products (6 to 8 servings daily)  Eat More Often: Whole-wheat bread,  brown rice, whole-grain or wheat pasta, quinoa, popcorn without added fat or salt (air popped).  Eat Less Often: White bread, white pasta, white rice, cornbread. Vegetables (4 to 5 servings daily)  Eat More Often: Fresh, frozen, and canned vegetables. Vegetables may be raw, steamed, roasted, or grilled with a minimal amount of fat.  Eat Less Often/Avoid: Creamed or fried vegetables. Vegetables in a cheese sauce. Fruit (4 to 5 servings daily)  Eat More Often: All fresh, canned (in natural juice), or frozen fruits. Dried fruits without added sugar. One hundred percent fruit juice ( cup [237 mL] daily).  Eat Less Often: Dried fruits with added sugar. Canned fruit in light or heavy syrup. [employer], Fish, and Poultry (2 servings or less daily. One serving is 3 to 4 oz [85-114 g]).  Eat More Often: Ninety percent or leaner ground beef, tenderloin, sirloin. Round cuts of beef, chicken breast, [place] breast. All fish. Grill, bake, or broil your meat. Nothing should be fried.  Eat Less Often/Avoid: Fatty cuts of meat, [place], or chicken leg, thigh, or wing. Fried cuts of meat or fish. Dairy (2 to 3 servings)  Eat More Often: Low-fat or fat-free milk, low-fat plain or light yogurt, reduced-fat or part-skim cheese.  Eat Less Often/Avoid: Milk (whole, 2%).Whole milk yogurt. Full-fat cheeses. Nuts, Seeds, and Legumes (4 to 5 servings per week)  Eat More Often: All without added salt.  Eat Less Often/Avoid: Salted nuts and seeds, canned beans with added salt. Fats and Sweets (limited)  Eat More Often: Vegetable oils, tub margarines without trans fats, sugar-free gelatin. Mayonnaise and salad dressings.  Eat Less Often/Avoid: Coconut oils, palm oils, butter, stick margarine, cream, half and half, cookies, candy, pie. FOR MORE INFORMATION The Dash Diet Eating Plan: www.dashdiet.org Document Released: 09/23/2011 Document Revised: 12/27/2011 Document Reviewed: 09/23/2011 [facility] Patient  Information 2014 [place], [place].

## 2013-07-12 NOTE — Progress Notes (Signed)
  Subjective:    Patient ID: Jason Spence, male    DOB: [birth year], 72 y.o.   MRN: [id number]  HPI AWV- Annual Wellness Visit   Waist: 33 in.  The patient was seen for their annual wellness visit. The patient's past medical history, surgical history, and family history were reviewed. Pertinent vaccines were reviewed ( tetanus, pneumonia, shingles, flu) The patient's medication list was reviewed and updated. The height and weight were entered. The patient's current BMI is:  Cognitive screening was completed. Outcome of Mini - Cog: Passed  Falls within the past 6 months: No Current tobacco usage: No (All patients who use tobacco were given written and verbal information on quitting)  Recent listing of emergency department/hospitalizations over the past year were reviewed.  current specialist the patient sees on a regular basis:  -sees eye doc once a year  -er visit Oct  2013  Medicare annual wellness visit patient questionnaire was reviewed.  A written screening schedule for the patient for the next 5-10 years was given. Appropriate discussion of followup regarding next visit was discussed.       Review of Systems  Constitutional: Negative for fever, activity change and appetite change.  HENT: Negative for congestion, rhinorrhea and neck pain.   Eyes: Negative for discharge.  Respiratory: Negative for cough and wheezing.   Cardiovascular: Negative for chest pain.  Gastrointestinal: Negative for vomiting, abdominal pain and blood in stool.  Genitourinary: Negative for frequency and difficulty urinating.  Skin: Negative for rash.  Allergic/Immunologic: Negative for environmental allergies and food allergies.  Neurological: Negative for weakness and headaches.  Psychiatric/Behavioral: Negative for agitation.       Objective:   Physical Exam  Constitutional: He appears well-developed and well-nourished.  HENT:  Head: Normocephalic and atraumatic.  Right Ear:  External ear normal.  Left Ear: External ear normal.  Nose: Nose normal.  Mouth/Throat: Oropharynx is clear and moist.  Eyes: EOM are normal. Pupils are equal, round, and reactive to light.  Neck: Normal range of motion. Neck supple. No thyromegaly present.  Cardiovascular: Normal rate, regular rhythm and normal heart sounds.   No murmur heard. Pulmonary/Chest: Effort normal and breath sounds normal. No respiratory distress. He has no wheezes.  Abdominal: Soft. Bowel sounds are normal. He exhibits no distension and no mass. There is no tenderness.  Genitourinary: Prostate normal and penis normal.  Musculoskeletal: Normal range of motion. He exhibits no edema.  Lymphadenopathy:    He has no cervical adenopathy.  Neurological: He is alert. He exhibits normal muscle tone.  Skin: Skin is warm and dry. No erythema.  Psychiatric: He has a normal mood and affect. His behavior is normal. Judgment normal.          Assessment & Plan:  Jason Spence he is doing fine. He is encouraged to is healthy habits. He is up-to-date on colonoscopy. Prostate exam normal. Safety discussed. Proper diet discussed. Compliance with medicine. Followup again in 6 months. Labs reviewed.

## 2013-09-05 ENCOUNTER — Other Ambulatory Visit: Payer: Self-pay | Admitting: Physician Assistant

## 2013-12-03 ENCOUNTER — Ambulatory Visit (INDEPENDENT_AMBULATORY_CARE_PROVIDER_SITE_OTHER): Payer: Medicare HMO | Admitting: Family Medicine

## 2013-12-03 ENCOUNTER — Encounter: Payer: Self-pay | Admitting: Family Medicine

## 2013-12-03 VITALS — BP 110/78 | Temp 98.2°F | Ht 68.0 in | Wt 147.2 lb

## 2013-12-03 DIAGNOSIS — J069 Acute upper respiratory infection, unspecified: Secondary | ICD-10-CM

## 2013-12-03 MED ORDER — AMOXICILLIN 500 MG PO TABS: 500.0000 mg | ORAL_TABLET | Freq: Three times a day (TID) | ORAL | Status: DC

## 2013-12-03 NOTE — Progress Notes (Signed)
   Subjective:    Patient ID: Jason Spence, male    DOB: 1941/01/08, 73 y.o.   MRN: [id number]  Sore Throat  This is a new problem. The current episode started in the past 7 days. The problem has been unchanged. Neither side of throat is experiencing more pain than the other. There has been no fever. The pain is mild. Associated symptoms include congestion, coughing and ear pain. Associated symptoms comments: Watery eyes, sneezing. Treatments tried: decongestant. The treatment provided no relief.   Sister with breast cancer Friday sore throat/ head congestion/ body aches No fevers/ no chills No N/V  Review of Systems  Constitutional: Negative for fever and activity change.  HENT: Positive for congestion, ear pain and rhinorrhea.   Eyes: Negative for discharge.  Respiratory: Positive for cough. Negative for wheezing.   Cardiovascular: Negative for chest pain.       Objective:   Physical Exam  Nursing note and vitals reviewed. Constitutional: He appears well-developed.  HENT:  Head: Normocephalic.  Mouth/Throat: Oropharynx is clear and moist. No oropharyngeal exudate.  Neck: Normal range of motion.  Cardiovascular: Normal rate, regular rhythm and normal heart sounds.   No murmur heard. Pulmonary/Chest: Effort normal and breath sounds normal. He has no wheezes.  Lymphadenopathy:    He has no cervical adenopathy.  Neurological: He exhibits normal muscle tone.  Skin: Skin is warm and dry.          Assessment & Plan:  Viral upper a story of this should progressively get better  Mild sinusitis antibiotics prescribed call if problems, get antibiotics filled if progressive symptoms

## 2014-01-10 ENCOUNTER — Ambulatory Visit (INDEPENDENT_AMBULATORY_CARE_PROVIDER_SITE_OTHER): Payer: Medicare HMO | Admitting: Family Medicine

## 2014-01-10 ENCOUNTER — Encounter: Payer: Self-pay | Admitting: Family Medicine

## 2014-01-10 VITALS — BP 118/82 | Ht 68.0 in | Wt 148.0 lb

## 2014-01-10 DIAGNOSIS — M1712 Unilateral primary osteoarthritis, left knee: Secondary | ICD-10-CM

## 2014-01-10 DIAGNOSIS — E785 Hyperlipidemia, unspecified: Secondary | ICD-10-CM

## 2014-01-10 DIAGNOSIS — I10 Essential (primary) hypertension: Secondary | ICD-10-CM

## 2014-01-10 DIAGNOSIS — K219 Gastro-esophageal reflux disease without esophagitis: Secondary | ICD-10-CM | POA: Insufficient documentation

## 2014-01-10 DIAGNOSIS — M1710 Unilateral primary osteoarthritis, unspecified knee: Secondary | ICD-10-CM

## 2014-01-10 DIAGNOSIS — IMO0002 Reserved for concepts with insufficient information to code with codable children: Secondary | ICD-10-CM

## 2014-01-10 MED ORDER — OMEPRAZOLE 20 MG PO CPDR: 20.0000 mg | DELAYED_RELEASE_CAPSULE | Freq: Every day | ORAL | Status: DC | PRN

## 2014-01-10 MED ORDER — PRAVASTATIN SODIUM 40 MG PO TABS: 40.0000 mg | ORAL_TABLET | Freq: Every day | ORAL | Status: DC

## 2014-01-10 MED ORDER — RAMIPRIL 10 MG PO CAPS: 10.0000 mg | ORAL_CAPSULE | Freq: Every day | ORAL | Status: DC

## 2014-01-10 NOTE — Progress Notes (Signed)
   Subjective:    Patient ID: Jason Spence, male    DOB: 1941/06/27, 73 y.o.   MRN: [id number]  HPI Patient arrives for a follow up on blood pressure. Patient reports no new problems just continued issues with arthritis acting up at times. Patient has history of reflux under fairly good control History of hyperlipidemia he watches his diet history of hypertension and takes his medication Review of Systems He relates joint pain discomfort denies chest pain shortness breath denies rectal bleeding.    Objective:   Physical Exam Lungs clear he does have some arthritis noted. Extremities no edema skin warm dry blood pressure stable       Assessment & Plan:  Osteoarthritis-OTC anti-inflammatories may use sparingly if frequent may need other approach if joint swelling occurs redness etc. may need lab work. In  HTN good control continue current measures  Hyperlipidemia continue current measures watch diet closely  Physical activity encouraged.  Allergies-may use Allegra and Flonase when necessary

## 2014-02-21 ENCOUNTER — Ambulatory Visit (INDEPENDENT_AMBULATORY_CARE_PROVIDER_SITE_OTHER): Payer: Medicare HMO | Admitting: Family Medicine

## 2014-02-21 ENCOUNTER — Encounter: Payer: Self-pay | Admitting: Family Medicine

## 2014-02-21 VITALS — BP 134/68 | Temp 99.5°F | Ht 68.0 in | Wt 147.0 lb

## 2014-02-21 DIAGNOSIS — J069 Acute upper respiratory infection, unspecified: Secondary | ICD-10-CM

## 2014-02-21 MED ORDER — AZITHROMYCIN 250 MG PO TABS: ORAL_TABLET | ORAL | Status: DC

## 2014-02-21 NOTE — Progress Notes (Signed)
   Subjective:    Patient ID: Jason Spence, male    DOB: Oct 31, 1940, 73 y.o.   MRN: [id number]  Cough This is a new problem. The current episode started in the past 7 days. Associated symptoms include a fever, headaches, nasal congestion and a sore throat. Associated symptoms comments: Nose bleed, vomiting one time. Treatments tried: OTC sinus med. The treatment provided no relief.   Energy level fair   Review of Systems  Constitutional: Positive for fever.  HENT: Positive for sore throat.   Respiratory: Positive for cough.   Neurological: Positive for headaches.       Objective:   Physical Exam  Pulmonary/Chest: Breath sounds normal.   Throat nonerythematous eardrums normal lungs are clear hearts regular       Assessment & Plan:  URI. zpack script printed and given to pt.  to use if getting worse. Warning signs discussed I believe this is a viral process should gradually get better

## 2014-07-01 ENCOUNTER — Telehealth: Payer: Self-pay | Admitting: Family Medicine

## 2014-07-01 DIAGNOSIS — E782 Mixed hyperlipidemia: Secondary | ICD-10-CM

## 2014-07-01 DIAGNOSIS — Z79899 Other long term (current) drug therapy: Secondary | ICD-10-CM

## 2014-07-01 DIAGNOSIS — Z125 Encounter for screening for malignant neoplasm of prostate: Secondary | ICD-10-CM

## 2014-07-01 NOTE — Telephone Encounter (Signed)
Patient had Lipid, Liver and PSA 07/04/13

## 2014-07-01 NOTE — Telephone Encounter (Signed)
Blood work ordered in Epic. Patient notified. 

## 2014-07-01 NOTE — Telephone Encounter (Signed)
Lip/liv/met 7/psa

## 2014-07-01 NOTE — Telephone Encounter (Signed)
Patient needs order for blood work for upcoming physical.

## 2014-07-08 LAB — HEPATIC FUNCTION PANEL
ALK PHOS: 66 U/L (ref 39–117)
ALT: 16 U/L (ref 0–53)
AST: 26 U/L (ref 0–37)
Albumin: 4.3 g/dL (ref 3.5–5.2)
BILIRUBIN DIRECT: 0.1 mg/dL (ref 0.0–0.3)
BILIRUBIN INDIRECT: 0.4 mg/dL (ref 0.2–1.2)
Total Bilirubin: 0.5 mg/dL (ref 0.2–1.2)
Total Protein: 7.5 g/dL (ref 6.0–8.3)

## 2014-07-08 LAB — BASIC METABOLIC PANEL
BUN: 9 mg/dL (ref 6–23)
CALCIUM: 9.4 mg/dL (ref 8.4–10.5)
CO2: 30 mEq/L (ref 19–32)
CREATININE: 0.82 mg/dL (ref 0.50–1.35)
Chloride: 103 mEq/L (ref 96–112)
GLUCOSE: 91 mg/dL (ref 70–99)
Potassium: 4.5 mEq/L (ref 3.5–5.3)
Sodium: 139 mEq/L (ref 135–145)

## 2014-07-08 LAB — LIPID PANEL
CHOL/HDL RATIO: 3.2 ratio
CHOLESTEROL: 125 mg/dL (ref 0–200)
HDL: 39 mg/dL — ABNORMAL LOW (ref >39)
LDL Cholesterol: 55 mg/dL (ref 0–99)
Triglycerides: 153 mg/dL — ABNORMAL HIGH (ref <150)
VLDL: 31 mg/dL (ref 0–40)

## 2014-07-09 LAB — PSA, MEDICARE: PSA: 3.11 ng/mL (ref <=4.00)

## 2014-07-15 ENCOUNTER — Ambulatory Visit (INDEPENDENT_AMBULATORY_CARE_PROVIDER_SITE_OTHER): Payer: Medicare HMO | Admitting: Family Medicine

## 2014-07-15 ENCOUNTER — Encounter: Payer: Self-pay | Admitting: Family Medicine

## 2014-07-15 VITALS — BP 130/76 | Ht 68.0 in | Wt 147.2 lb

## 2014-07-15 DIAGNOSIS — E785 Hyperlipidemia, unspecified: Secondary | ICD-10-CM

## 2014-07-15 DIAGNOSIS — I10 Essential (primary) hypertension: Secondary | ICD-10-CM

## 2014-07-15 DIAGNOSIS — Z23 Encounter for immunization: Secondary | ICD-10-CM

## 2014-07-15 DIAGNOSIS — Z0000 Encounter for general adult medical examination without abnormal findings: Secondary | ICD-10-CM

## 2014-07-15 MED ORDER — AMLODIPINE BESYLATE 2.5 MG PO TABS: 2.5000 mg | ORAL_TABLET | Freq: Every day | ORAL | Status: DC

## 2014-07-15 MED ORDER — TRIAMCINOLONE ACETONIDE 0.1 % EX CREA: 1.0000 "application " | TOPICAL_CREAM | Freq: Two times a day (BID) | CUTANEOUS | Status: DC | PRN

## 2014-07-15 NOTE — Patient Instructions (Signed)
Hypertension Hypertension, commonly called high blood pressure, is when the force of blood pumping through your arteries is too strong. Your arteries are the blood vessels that carry blood from your heart throughout your body. A blood pressure reading consists of a higher number over a lower number, such as 110/72. The higher number (systolic) is the pressure inside your arteries when your heart pumps. The lower number (diastolic) is the pressure inside your arteries when your heart relaxes. Ideally you want your blood pressure below 120/80. Hypertension forces your heart to work harder to pump blood. Your arteries may become narrow or stiff. Having hypertension puts you at risk for heart disease, stroke, and other problems.  RISK FACTORS Some risk factors for high blood pressure are controllable. Others are not.  Risk factors you cannot control include:   Race. You may be at higher risk if you are African American.  Age. Risk increases with age.  Gender. Men are at higher risk than women before age 45 years. After age 65, women are at higher risk than men. Risk factors you can control include:  Not getting enough exercise or physical activity.  Being overweight.  Getting too much fat, sugar, calories, or salt in your diet.  Drinking too much alcohol. SIGNS AND SYMPTOMS Hypertension does not usually cause signs or symptoms. Extremely high blood pressure (hypertensive crisis) may cause headache, anxiety, shortness of breath, and nosebleed. DIAGNOSIS  To check if you have hypertension, your health care provider will measure your blood pressure while you are seated, with your arm held at the level of your heart. It should be measured at least twice using the same arm. Certain conditions can cause a difference in blood pressure between your right and left arms. A blood pressure reading that is higher than normal on one occasion does not mean that you need treatment. If one blood pressure reading  is high, ask your health care provider about having it checked again. TREATMENT  Treating high blood pressure includes making lifestyle changes and possibly taking medicine. Living a healthy lifestyle can help lower high blood pressure. You may need to change some of your habits. Lifestyle changes may include:  Following the DASH diet. This diet is high in fruits, vegetables, and whole grains. It is low in salt, red meat, and added sugars.  Getting at least 2 hours of brisk physical activity every week.  Losing weight if necessary.  Not smoking.  Limiting alcoholic beverages.  Learning ways to reduce stress. If lifestyle changes are not enough to get your blood pressure under control, your health care provider may prescribe medicine. You may need to take more than one. Work closely with your health care provider to understand the risks and benefits. HOME CARE INSTRUCTIONS  Have your blood pressure rechecked as directed by your health care provider.   Take medicines only as directed by your health care provider. Follow the directions carefully. Blood pressure medicines must be taken as prescribed. The medicine does not work as well when you skip doses. Skipping doses also puts you at risk for problems.   Do not smoke.   Monitor your blood pressure at home as directed by your health care provider. SEEK MEDICAL CARE IF:   You think you are having a reaction to medicines taken.  You have recurrent headaches or feel dizzy.  You have swelling in your ankles.  You have trouble with your vision. SEEK IMMEDIATE MEDICAL CARE IF:  You develop a severe headache or confusion.    You have unusual weakness, numbness, or feel faint.  You have severe chest or abdominal pain.  You vomit repeatedly.  You have trouble breathing. MAKE SURE YOU:   Understand these instructions.  Will watch your condition.  Will get help right away if you are not doing well or get worse. Document  Released: 10/04/2005 Document Revised: 02/18/2014 Document Reviewed: 07/27/2013 ExitCare Patient Information 2015 [employer]. This information is not intended to replace advice given to you by your health care provider. Make sure you discuss any questions you have with your health care provider. DASH Eating Plan DASH stands for "Dietary Approaches to Stop Hypertension." The DASH eating plan is a healthy eating plan that has been shown to reduce high blood pressure (hypertension). Additional health benefits may include reducing the risk of type 2 diabetes mellitus, heart disease, and stroke. The DASH eating plan may also help with weight loss. WHAT DO I NEED TO KNOW ABOUT THE DASH EATING PLAN? For the DASH eating plan, you will follow these general guidelines:  Choose foods with a percent daily value for sodium of less than 5% (as listed on the food label).  Use salt-free seasonings or herbs instead of table salt or sea salt.  Check with your health care provider or pharmacist before using salt substitutes.  Eat lower-sodium products, often labeled as "lower sodium" or "no salt added."  Eat fresh foods.  Eat more vegetables, fruits, and low-fat dairy products.  Choose whole grains. Look for the word "whole" as the first word in the ingredient list.  Choose fish and skinless chicken or turkey more often than red meat. Limit fish, poultry, and meat to 6 oz (170 g) each day.  Limit sweets, desserts, sugars, and sugary drinks.  Choose heart-healthy fats.  Limit cheese to 1 oz (28 g) per day.  Eat more home-cooked food and less restaurant, buffet, and fast food.  Limit fried foods.  Cook foods using methods other than frying.  Limit canned vegetables. If you do use them, rinse them well to decrease the sodium.  When eating at a restaurant, ask that your food be prepared with less salt, or no salt if possible. WHAT FOODS CAN I EAT? Seek help from a dietitian for individual  calorie needs. Grains Whole grain or whole wheat bread. Brown rice. Whole grain or whole wheat pasta. Quinoa, bulgur, and whole grain cereals. Low-sodium cereals. Corn or whole wheat flour tortillas. Whole grain cornbread. Whole grain crackers. Low-sodium crackers. Vegetables Fresh or frozen vegetables (raw, steamed, roasted, or grilled). Low-sodium or reduced-sodium tomato and vegetable juices. Low-sodium or reduced-sodium tomato sauce and paste. Low-sodium or reduced-sodium canned vegetables.  Fruits All fresh, canned (in natural juice), or frozen fruits. Meat and Other Protein Products Ground beef (85% or leaner), grass-fed beef, or beef trimmed of fat. Skinless chicken or turkey. Ground chicken or turkey. Pork trimmed of fat. All fish and seafood. Eggs. Dried beans, peas, or lentils. Unsalted nuts and seeds. Unsalted canned beans. Dairy Low-fat dairy products, such as skim or 1% milk, 2% or reduced-fat cheeses, low-fat ricotta or cottage cheese, or plain low-fat yogurt. Low-sodium or reduced-sodium cheeses. Fats and Oils Tub margarines without trans fats. Light or reduced-fat mayonnaise and salad dressings (reduced sodium). Avocado. Safflower, olive, or canola oils. Natural peanut or almond butter. Other Unsalted popcorn and pretzels. The items listed above may not be a complete list of recommended foods or beverages. Contact your dietitian for more options. WHAT FOODS ARE NOT RECOMMENDED? Grains White bread.   White pasta. White rice. Refined cornbread. Bagels and croissants. Crackers that contain trans fat. Vegetables Creamed or fried vegetables. Vegetables in a cheese sauce. Regular canned vegetables. Regular canned tomato sauce and paste. Regular tomato and vegetable juices. Fruits Dried fruits. Canned fruit in light or heavy syrup. Fruit juice. Meat and Other Protein Products Fatty cuts of meat. Ribs, chicken wings, bacon, sausage, bologna, salami, chitterlings, fatback, hot dogs,  bratwurst, and packaged luncheon meats. Salted nuts and seeds. Canned beans with salt. Dairy Whole or 2% milk, cream, half-and-half, and cream cheese. Whole-fat or sweetened yogurt. Full-fat cheeses or blue cheese. Nondairy creamers and whipped toppings. Processed cheese, cheese spreads, or cheese curds. Condiments Onion and garlic salt, seasoned salt, table salt, and sea salt. Canned and packaged gravies. Worcestershire sauce. Tartar sauce. Barbecue sauce. Teriyaki sauce. Soy sauce, including reduced sodium. Steak sauce. Fish sauce. Oyster sauce. Cocktail sauce. Horseradish. Ketchup and mustard. Meat flavorings and tenderizers. Bouillon cubes. Hot sauce. Tabasco sauce. Marinades. Taco seasonings. Relishes. Fats and Oils Butter, stick margarine, lard, shortening, ghee, and bacon fat. Coconut, palm kernel, or palm oils. Regular salad dressings. Other Pickles and olives. Salted popcorn and pretzels. The items listed above may not be a complete list of foods and beverages to avoid. Contact your dietitian for more information. WHERE CAN I FIND MORE INFORMATION? National Heart, Lung, and Blood Institute: www.nhlbi.nih.gov/health/health-topics/topics/dash/ Document Released: 09/23/2011 Document Revised: 02/18/2014 Document Reviewed: 08/08/2013 ExitCare Patient Information 2015 [employer]. This information is not intended to replace advice given to you by your health care provider. Make sure you discuss any questions you have with your health care provider.  

## 2014-07-15 NOTE — Progress Notes (Signed)
   Subjective:    Patient ID: Jason Spence, male    DOB: [birth year], 73 y.o.   MRN: [id number]  HPI AWV- Annual Wellness Visit  The patient was seen for their annual wellness visit. The patient's past medical history, surgical history, and family history were reviewed. Pertinent vaccines were reviewed ( tetanus, pneumonia, shingles, flu) The patient's medication list was reviewed and updated.  The height and weight were entered. The patient's current BMI is:22.38  Cognitive screening was completed. Outcome of Mini - Cog: pass  Falls within the past 6 months:none  Current tobacco usage: none (All patients who use tobacco were given written and verbal information on quitting)  Recent listing of emergency department/hospitalizations over the past year were reviewed.  current specialist the patient sees on a regular basis: Dr Denna Haggard- dermatologist   Medicare annual wellness visit patient questionnaire was reviewed.  A written screening schedule for the patient for the next 5-10 years was given. Appropriate discussion of followup regarding next visit was discussed.   Long discussion held regarding his blood pressure and cholesterol the importance of healthy eating and watching his diet. Blood pressure readings have been elevated. He also is having a rash on his buttock area that is bothering him    Review of Systems  Constitutional: Negative for fever, activity change and appetite change.  HENT: Negative for congestion and rhinorrhea.   Eyes: Negative for discharge.  Respiratory: Negative for cough and wheezing.   Cardiovascular: Negative for chest pain.  Gastrointestinal: Negative for vomiting, abdominal pain and blood in stool.  Genitourinary: Negative for frequency and difficulty urinating.  Musculoskeletal: Negative for neck pain.  Skin: Negative for rash.  Allergic/Immunologic: Negative for environmental allergies and food allergies.  Neurological: Negative for  weakness and headaches.  Psychiatric/Behavioral: Negative for agitation.       Objective:   Physical Exam  Constitutional: He appears well-developed and well-nourished.  HENT:  Head: Normocephalic and atraumatic.  Right Ear: External ear normal.  Left Ear: External ear normal.  Nose: Nose normal.  Mouth/Throat: Oropharynx is clear and moist.  Eyes: EOM are normal. Pupils are equal, round, and reactive to light.  Neck: Normal range of motion. Neck supple. No thyromegaly present.  Cardiovascular: Normal rate, regular rhythm and normal heart sounds.   No murmur heard. Pulmonary/Chest: Effort normal and breath sounds normal. No respiratory distress. He has no wheezes.  Abdominal: Soft. Bowel sounds are normal. He exhibits no distension and no mass. There is no tenderness.  Genitourinary: Penis normal.  Musculoskeletal: Normal range of motion. He exhibits no edema.  Lymphadenopathy:    He has no cervical adenopathy.  Neurological: He is alert. He exhibits normal muscle tone.  Skin: Skin is warm and dry. No erythema.  Psychiatric: He has a normal mood and affect. His behavior is normal. Judgment normal.    Rash on lower bottom looks more like atopic dermatitis but I cannot exclude the possibility of a yeast      Assessment & Plan:  Rash-will use mild steroid cream may need antifungal cream if it persists Start low-dose amlodipine because blood pressure is higher on systolic than it should be followup blood pressure again within 2-3 months Wellness exam reviewed the importance of keeping up on colonoscopy reviewed immunizations updated Hyperlipidemia doing well with medication continue current medication  Up-to-date on colonoscopy.

## 2014-08-20 ENCOUNTER — Telehealth: Payer: Self-pay | Admitting: Family Medicine

## 2014-08-20 NOTE — Telephone Encounter (Signed)
NTC- itching? Painful? Tender? Did cream help. ( needs OV if painful- tender or a lot worse)

## 2014-08-20 NOTE — Telephone Encounter (Signed)
Patient was seen for a rash on his buttock area on 07/15/2014 and given triamcinolone cream (KENALOG) 0.1 %.  He said that the rash is now worse than it was before.  Does he need to be seen or can we change the medication?   [facility]

## 2014-08-21 ENCOUNTER — Encounter: Payer: Self-pay | Admitting: Family Medicine

## 2014-08-21 ENCOUNTER — Ambulatory Visit (INDEPENDENT_AMBULATORY_CARE_PROVIDER_SITE_OTHER): Payer: Medicare HMO | Admitting: Family Medicine

## 2014-08-21 VITALS — BP 122/74 | Temp 98.7°F | Ht 68.0 in | Wt 147.0 lb

## 2014-08-21 DIAGNOSIS — B356 Tinea cruris: Secondary | ICD-10-CM

## 2014-08-21 MED ORDER — FLUCONAZOLE 200 MG PO TABS: 200.0000 mg | ORAL_TABLET | Freq: Every day | ORAL | Status: DC

## 2014-08-21 MED ORDER — KETOCONAZOLE 2 % EX CREA: 1.0000 "application " | TOPICAL_CREAM | Freq: Two times a day (BID) | CUTANEOUS | Status: DC

## 2014-08-21 NOTE — Progress Notes (Signed)
   Subjective:    Patient ID: Jason Spence, male    DOB: 12-29-1940, 73 y.o.   MRN: [id number]  Rash This is a new problem. The current episode started more than 1 month ago. Pain location: low back and buttock. The rash is characterized by burning, itchiness, redness and pain. Treatments tried: triamcinolone. The treatment provided no relief.   He states he area seems to be getting worse rather than better   Review of Systems  Skin: Positive for rash.  no fever he relates it itches and burns somewhat tender     Objective:   Physical Exam  Significant rash noted on the bottom with scaling on the periphery with central clearing multiple circles in both Buttocks not confluent no sign of bacterial component  When he last presented had more the look of early atopic dermatitis     Assessment & Plan:  Probable tinea recommend Diflucan as directed also recommend ketoconazole treatment if progressive troubles or if worse follow-up  More than likely the triamcinolone initially helped but then obviously did not do anything for him I recommended to the patient to stop this.  If not doing better over the next 2 weeks consider referralto dermatology

## 2014-08-21 NOTE — Telephone Encounter (Signed)
Patient states that the rash is painful and tender. Transferred to front desk to schedule appointment.

## 2014-09-25 ENCOUNTER — Ambulatory Visit (INDEPENDENT_AMBULATORY_CARE_PROVIDER_SITE_OTHER): Payer: Medicare HMO | Admitting: Family Medicine

## 2014-09-25 ENCOUNTER — Encounter: Payer: Self-pay | Admitting: Family Medicine

## 2014-09-25 VITALS — BP 130/90 | Ht 68.0 in | Wt 148.0 lb

## 2014-09-25 DIAGNOSIS — I10 Essential (primary) hypertension: Secondary | ICD-10-CM

## 2014-09-25 DIAGNOSIS — D219 Benign neoplasm of connective and other soft tissue, unspecified: Secondary | ICD-10-CM

## 2014-09-25 MED ORDER — AMLODIPINE BESYLATE 5 MG PO TABS: 5.0000 mg | ORAL_TABLET | Freq: Every day | ORAL | Status: DC

## 2014-09-25 NOTE — Patient Instructions (Signed)
DASH Eating Plan °DASH stands for "Dietary Approaches to Stop Hypertension." The DASH eating plan is a healthy eating plan that has been shown to reduce high blood pressure (hypertension). Additional health benefits may include reducing the risk of type 2 diabetes mellitus, heart disease, and stroke. The DASH eating plan may also help with weight loss. °WHAT DO I NEED TO KNOW ABOUT THE DASH EATING PLAN? °For the DASH eating plan, you will follow these general guidelines: °· Choose foods with a percent daily value for sodium of less than 5% (as listed on the food label). °· Use salt-free seasonings or herbs instead of table salt or sea salt. °· Check with your health care provider or pharmacist before using salt substitutes. °· Eat lower-sodium products, often labeled as "lower sodium" or "no salt added." °· Eat fresh foods. °· Eat more vegetables, fruits, and low-fat dairy products. °· Choose whole grains. Look for the word "whole" as the first word in the ingredient list. °· Choose fish and skinless chicken or turkey more often than red meat. Limit fish, poultry, and meat to 6 oz (170 g) each day. °· Limit sweets, desserts, sugars, and sugary drinks. °· Choose heart-healthy fats. °· Limit cheese to 1 oz (28 g) per day. °· Eat more home-cooked food and less restaurant, buffet, and fast food. °· Limit fried foods. °· Cook foods using methods other than frying. °· Limit canned vegetables. If you do use them, rinse them well to decrease the sodium. °· When eating at a restaurant, ask that your food be prepared with less salt, or no salt if possible. °WHAT FOODS CAN I EAT? °Seek help from a dietitian for individual calorie needs. °Grains °Whole grain or whole wheat bread. Brown rice. Whole grain or whole wheat pasta. Quinoa, bulgur, and whole grain cereals. Low-sodium cereals. Corn or whole wheat flour tortillas. Whole grain cornbread. Whole grain crackers. Low-sodium crackers. °Vegetables °Fresh or frozen vegetables  (raw, steamed, roasted, or grilled). Low-sodium or reduced-sodium tomato and vegetable juices. Low-sodium or reduced-sodium tomato sauce and paste. Low-sodium or reduced-sodium canned vegetables.  °Fruits °All fresh, canned (in natural juice), or frozen fruits. °Meat and Other Protein Products °Ground beef (85% or leaner), grass-fed beef, or beef trimmed of fat. Skinless chicken or turkey. Ground chicken or turkey. Pork trimmed of fat. All fish and seafood. Eggs. Dried beans, peas, or lentils. Unsalted nuts and seeds. Unsalted canned beans. °Dairy °Low-fat dairy products, such as skim or 1% milk, 2% or reduced-fat cheeses, low-fat ricotta or cottage cheese, or plain low-fat yogurt. Low-sodium or reduced-sodium cheeses. °Fats and Oils °Tub margarines without trans fats. Light or reduced-fat mayonnaise and salad dressings (reduced sodium). Avocado. Safflower, olive, or canola oils. Natural peanut or almond butter. °Other °Unsalted popcorn and pretzels. °The items listed above may not be a complete list of recommended foods or beverages. Contact your dietitian for more options. °WHAT FOODS ARE NOT RECOMMENDED? °Grains °White bread. White pasta. White rice. Refined cornbread. Bagels and croissants. Crackers that contain trans fat. °Vegetables °Creamed or fried vegetables. Vegetables in a cheese sauce. Regular canned vegetables. Regular canned tomato sauce and paste. Regular tomato and vegetable juices. °Fruits °Dried fruits. Canned fruit in light or heavy syrup. Fruit juice. °Meat and Other Protein Products °Fatty cuts of meat. Ribs, chicken wings, bacon, sausage, bologna, salami, chitterlings, fatback, hot dogs, bratwurst, and packaged luncheon meats. Salted nuts and seeds. Canned beans with salt. °Dairy °Whole or 2% milk, cream, half-and-half, and cream cheese. Whole-fat or sweetened yogurt. Full-fat   cheeses or blue cheese. Nondairy creamers and whipped toppings. Processed cheese, cheese spreads, or cheese  curds. °Condiments °Onion and garlic salt, seasoned salt, table salt, and sea salt. Canned and packaged gravies. Worcestershire sauce. Tartar sauce. Barbecue sauce. Teriyaki sauce. Soy sauce, including reduced sodium. Steak sauce. Fish sauce. Oyster sauce. Cocktail sauce. Horseradish. Ketchup and mustard. Meat flavorings and tenderizers. Bouillon cubes. Hot sauce. Tabasco sauce. Marinades. Taco seasonings. Relishes. °Fats and Oils °Butter, stick margarine, lard, shortening, ghee, and bacon fat. Coconut, palm kernel, or palm oils. Regular salad dressings. °Other °Pickles and olives. Salted popcorn and pretzels. °The items listed above may not be a complete list of foods and beverages to avoid. Contact your dietitian for more information. °WHERE CAN I FIND MORE INFORMATION? °National Heart, Lung, and Blood Institute: www.nhlbi.nih.gov/health/health-topics/topics/dash/ °Document Released: 09/23/2011 Document Revised: 02/18/2014 Document Reviewed: 08/08/2013 °ExitCare® Patient Information ©2015 [employer]. This information is not intended to replace advice given to you by your health care provider. Make sure you discuss any questions you have with your health care provider. ° °

## 2014-09-25 NOTE — Progress Notes (Signed)
   Subjective:    Patient ID: Jason Spence, male    DOB: 1941-02-13, 73 y.o.   MRN: [id number]  Hypertension This is a chronic problem. The current episode started more than 1 year ago. The problem has been gradually improving since onset. The problem is controlled. Pertinent negatives include no chest pain. There are no associated agents to hypertension. There are no known risk factors for coronary artery disease. Treatments tried: amlodipine. The current treatment provides significant improvement. There are no compliance problems.    Patient states he has a sore on the right side of his mouth that he would like the doctor to look at. It has been present for about 2-3 weeks now.    Review of Systems  Constitutional: Negative for activity change, appetite change and fatigue.  HENT: Negative for congestion.   Respiratory: Negative for cough.   Cardiovascular: Negative for chest pain.  Gastrointestinal: Negative for abdominal pain.  Endocrine: Negative for polydipsia and polyphagia.  Neurological: Negative for weakness.  Psychiatric/Behavioral: Negative for confusion.       Objective:   Physical Exam  Constitutional: He appears well-nourished. No distress.  Cardiovascular: Normal rate, regular rhythm and normal heart sounds.   No murmur heard. Pulmonary/Chest: Effort normal and breath sounds normal. No respiratory distress.  Musculoskeletal: He exhibits no edema.  Lymphadenopathy:    He has no cervical adenopathy.  Neurological: He is alert.  Psychiatric: His behavior is normal.  Vitals reviewed.   Fibroma in the mouth-it is smooth soft very small proximally 3 mm on the side of his mouth or his teeth come together he is missing some teeth so it's probably causing some abrasion of this area      Assessment & Plan:  fibroma in the mouth this is not a sign a cancer he will show it to [place] on his next visit if he gets worse or starts bleeding follow-up  Blood pressure subpar  increase the dose to 5 mg he will send [place] some readings he'll follow-up in the spring low salt diet stay physically

## 2014-10-24 ENCOUNTER — Ambulatory Visit (INDEPENDENT_AMBULATORY_CARE_PROVIDER_SITE_OTHER): Payer: Medicare HMO | Admitting: Family Medicine

## 2014-10-24 ENCOUNTER — Encounter: Payer: Self-pay | Admitting: Family Medicine

## 2014-10-24 VITALS — Temp 98.6°F | Ht 68.0 in | Wt 148.4 lb

## 2014-10-24 DIAGNOSIS — J329 Chronic sinusitis, unspecified: Secondary | ICD-10-CM

## 2014-10-24 MED ORDER — AMOXICILLIN 500 MG PO CAPS: 500.0000 mg | ORAL_CAPSULE | Freq: Three times a day (TID) | ORAL | Status: DC

## 2014-10-24 NOTE — Progress Notes (Signed)
   Subjective:    Patient ID: Jason Spence, male    DOB: 1940/11/23, 74 y.o.   MRN: [id number]  Cough This is a new problem. The current episode started in the past 7 days. Associated symptoms include headaches, nasal congestion and a sore throat.   No fever felt achey felt warm  Took tylenol  Some prod cough  Nauseated with it   Tylenol prn headache   Review of Systems  HENT: Positive for sore throat.   Respiratory: Positive for cough.   Neurological: Positive for headaches.       Objective:   Physical Exam  Alert good hydration. Vitals stable. HEENT nasal congestion frontal tenderness pharynx normal lungs intermittent bronchial cough heart regular in rhythm      Assessment & Plan:  Impression acute rhinosinusitis plan antibiotics prescribed. Symptomatic care discussed. Warning signs discussed. WSL

## 2014-11-28 ENCOUNTER — Telehealth: Payer: Self-pay | Admitting: Family Medicine

## 2014-11-28 NOTE — Telephone Encounter (Signed)
See in folder BP readings from pt that were dropped off

## 2014-12-02 ENCOUNTER — Telehealth: Payer: Self-pay | Admitting: Family Medicine

## 2014-12-02 NOTE — Telephone Encounter (Signed)
Please see other phone message regarding this patient. Keep medicine as is follow-up in the spring

## 2014-12-02 NOTE — Telephone Encounter (Signed)
Patient sent to the office his blood pressure readings. He was seen a couple months ago and had his blood pressure medicine adjusted. He was advised to send [place] some readings. The majority of these are improved compared to what they were. Advised the patient to stick with current dose. Advised patient to follow-up in the spring. If he needs refills please provide.

## 2014-12-03 NOTE — Telephone Encounter (Signed)
LMRC

## 2014-12-10 NOTE — Telephone Encounter (Signed)
Discussed with patient. Patient verbalized understanding. 

## 2015-01-09 ENCOUNTER — Telehealth: Payer: Self-pay | Admitting: Family Medicine

## 2015-01-09 MED ORDER — RAMIPRIL 10 MG PO CAPS: 10.0000 mg | ORAL_CAPSULE | Freq: Every day | ORAL | Status: DC

## 2015-01-09 MED ORDER — AMLODIPINE BESYLATE 5 MG PO TABS: 5.0000 mg | ORAL_TABLET | Freq: Every day | ORAL | Status: DC

## 2015-01-09 MED ORDER — PRAVASTATIN SODIUM 40 MG PO TABS: 40.0000 mg | ORAL_TABLET | Freq: Every day | ORAL | Status: DC

## 2015-01-09 NOTE — Telephone Encounter (Signed)
Pt is transferring pharmacies and dropped off a list of meds that need to be  Called in to [facility] in [place]. Pt is requesting 90 day supplies.

## 2015-01-09 NOTE — Telephone Encounter (Signed)
Refills on medications sent to [facility]. Patient was notified.

## 2015-01-27 ENCOUNTER — Ambulatory Visit (INDEPENDENT_AMBULATORY_CARE_PROVIDER_SITE_OTHER): Payer: Medicare HMO | Admitting: Family Medicine

## 2015-01-27 VITALS — BP 156/86 | Ht 68.0 in | Wt 150.0 lb

## 2015-01-27 DIAGNOSIS — H3530 Unspecified macular degeneration: Secondary | ICD-10-CM | POA: Diagnosis not present

## 2015-01-27 DIAGNOSIS — I10 Essential (primary) hypertension: Secondary | ICD-10-CM | POA: Diagnosis not present

## 2015-01-27 DIAGNOSIS — E785 Hyperlipidemia, unspecified: Secondary | ICD-10-CM | POA: Diagnosis not present

## 2015-01-27 NOTE — Progress Notes (Signed)
   Subjective:    Patient ID: Jason Spence, male    DOB: January 20, 1941, 74 y.o.   MRN: [id number]  Hypertension This is a chronic problem. The current episode started more than 1 year ago. The problem is unchanged. The problem is controlled. Pertinent negatives include no chest pain. Risk factors for coronary artery disease include dyslipidemia and male gender. Past treatments include calcium channel blockers (norvasc, altace). The current treatment provides moderate improvement. There is no history of angina.  Hyperlipidemia This is a chronic problem. The current episode started more than 1 year ago. The problem is controlled. Recent lipid tests were reviewed and are normal. Pertinent negatives include no chest pain. The current treatment provides significant improvement of lipids. There are no compliance problems.  Risk factors for coronary artery disease include dyslipidemia.   Seasonal allergy using over-the-counter medication seems to help  Arthralgia intermittently in the joints I recommended Tylenol  Macular degeneration follows with the eye specialist Review of Systems  Constitutional: Negative for activity change, appetite change and fatigue.  HENT: Negative for congestion.   Respiratory: Negative for cough.   Cardiovascular: Negative for chest pain.  Gastrointestinal: Negative for abdominal pain.  Endocrine: Negative for polydipsia and polyphagia.  Neurological: Negative for weakness.  Psychiatric/Behavioral: Negative for confusion.       Objective:   Physical Exam  Constitutional: He appears well-nourished. No distress.  Cardiovascular: Normal rate, regular rhythm and normal heart sounds.   No murmur heard. Pulmonary/Chest: Effort normal and breath sounds normal. No respiratory distress.  Musculoskeletal: He exhibits no edema.  Lymphadenopathy:    He has no cervical adenopathy.  Neurological: He is alert.  Psychiatric: His behavior is normal.  Vitals  reviewed.         Assessment & Plan:  Blood pressure elevated did not take medicine today he is to follow up on Thursday with medications and is blood pressure cuff for further evaluation healthy diet regular physical activity recommended  Arthralgias normal for age Tylenol when necessary  Comprehensive lab work in the fall

## 2015-01-30 ENCOUNTER — Ambulatory Visit: Payer: Medicare HMO | Admitting: Family Medicine

## 2015-01-30 MED ORDER — AMLODIPINE BESYLATE 10 MG PO TABS: 10.0000 mg | ORAL_TABLET | Freq: Every day | ORAL | Status: DC

## 2015-01-30 NOTE — Addendum Note (Signed)
Addended by: Sallee Lange A on: 01/30/2015 02:07 PM   Modules accepted: Orders

## 2015-03-22 ENCOUNTER — Emergency Department (HOSPITAL_COMMUNITY)
Admission: EM | Admit: 2015-03-22 | Discharge: 2015-03-22 | Disposition: A | Discharge status: Home / Self Care | Payer: Medicare HMO | Attending: Emergency Medicine | Admitting: Emergency Medicine

## 2015-03-22 ENCOUNTER — Emergency Department (HOSPITAL_COMMUNITY): Payer: Medicare HMO

## 2015-03-22 DIAGNOSIS — Z7982 Long term (current) use of aspirin: Secondary | ICD-10-CM | POA: Insufficient documentation

## 2015-03-22 DIAGNOSIS — M1990 Unspecified osteoarthritis, unspecified site: Secondary | ICD-10-CM | POA: Insufficient documentation

## 2015-03-22 DIAGNOSIS — R51 Headache: Secondary | ICD-10-CM | POA: Diagnosis not present

## 2015-03-22 DIAGNOSIS — Y9389 Activity, other specified: Secondary | ICD-10-CM | POA: Insufficient documentation

## 2015-03-22 DIAGNOSIS — Z951 Presence of aortocoronary bypass graft: Secondary | ICD-10-CM | POA: Diagnosis not present

## 2015-03-22 DIAGNOSIS — M25522 Pain in left elbow: Secondary | ICD-10-CM | POA: Diagnosis present

## 2015-03-22 DIAGNOSIS — I10 Essential (primary) hypertension: Secondary | ICD-10-CM | POA: Diagnosis not present

## 2015-03-22 DIAGNOSIS — M25529 Pain in unspecified elbow: Secondary | ICD-10-CM

## 2015-03-22 DIAGNOSIS — I252 Old myocardial infarction: Secondary | ICD-10-CM | POA: Diagnosis not present

## 2015-03-22 DIAGNOSIS — Z87891 Personal history of nicotine dependence: Secondary | ICD-10-CM | POA: Diagnosis not present

## 2015-03-22 DIAGNOSIS — M7022 Olecranon bursitis, left elbow: Secondary | ICD-10-CM

## 2015-03-22 DIAGNOSIS — E785 Hyperlipidemia, unspecified: Secondary | ICD-10-CM | POA: Diagnosis not present

## 2015-03-22 DIAGNOSIS — M7032 Other bursitis of elbow, left elbow: Secondary | ICD-10-CM | POA: Insufficient documentation

## 2015-03-22 DIAGNOSIS — Z8719 Personal history of other diseases of the digestive system: Secondary | ICD-10-CM | POA: Diagnosis not present

## 2015-03-22 DIAGNOSIS — M25532 Pain in left wrist: Secondary | ICD-10-CM | POA: Diagnosis not present

## 2015-03-22 DIAGNOSIS — Z79899 Other long term (current) drug therapy: Secondary | ICD-10-CM | POA: Insufficient documentation

## 2015-03-22 LAB — BASIC METABOLIC PANEL
Anion gap: 8 (ref 5–15)
BUN: 9 mg/dL (ref 6–20)
CALCIUM: 9.5 mg/dL (ref 8.9–10.3)
CHLORIDE: 102 mmol/L (ref 101–111)
CO2: 29 mmol/L (ref 22–32)
Creatinine, Ser: 0.66 mg/dL (ref 0.61–1.24)
GFR calc Af Amer: >60 mL/min (ref >60)
GFR calc non Af Amer: >60 mL/min (ref >60)
GLUCOSE: 90 mg/dL (ref 65–99)
Potassium: 4 mmol/L (ref 3.5–5.1)
SODIUM: 139 mmol/L (ref 135–145)

## 2015-03-22 LAB — CBC
HEMATOCRIT: 38.8 % — AB (ref 39.0–52.0)
Hemoglobin: 12 g/dL — ABNORMAL LOW (ref 13.0–17.0)
MCH: 23.1 pg — AB (ref 26.0–34.0)
MCHC: 30.9 g/dL (ref 30.0–36.0)
MCV: 74.6 fL — ABNORMAL LOW (ref 78.0–100.0)
Platelets: 276 10*3/uL (ref 150–400)
RBC: 5.2 MIL/uL (ref 4.22–5.81)
RDW: 16.1 % — AB (ref 11.5–15.5)
WBC: 9.4 10*3/uL (ref 4.0–10.5)

## 2015-03-22 MED ORDER — HYDROCODONE-ACETAMINOPHEN 5-325 MG PO TABS
1.0000 | ORAL_TABLET | Freq: Once | ORAL | Status: AC
  Administered 2015-03-22: 1 via ORAL
  Filled 2015-03-22: qty 1

## 2015-03-22 MED ORDER — HYDROCODONE-ACETAMINOPHEN 5-325 MG PO TABS: 1.0000 | ORAL_TABLET | Freq: Four times a day (QID) | ORAL | Status: DC | PRN

## 2015-03-22 NOTE — ED Notes (Signed)
Pt reports onset Thursday night left elbow and forearm swollen and painful.  No chest pain or shortness of breath.  Taking Ibuprofen with no relief.  Pt reports this has happened before and was dx with bursitis.

## 2015-03-22 NOTE — ED Notes (Signed)
Called Lab. Stated patient's labs are being done now.

## 2015-03-22 NOTE — ED Notes (Signed)
Paged Ortho for long arm splint placement.

## 2015-03-22 NOTE — Discharge Instructions (Signed)
Bursitis °Bursitis is a swelling and soreness (inflammation) of a fluid-filled sac (bursa) that overlies and protects a joint. It can be caused by injury, overuse of the joint, arthritis or infection. The joints most likely to be affected are the elbows, shoulders, hips and knees. °HOME CARE INSTRUCTIONS  °· Apply ice to the affected area for 15-20 minutes each hour while awake for 2 days. Put the ice in a plastic bag and place a towel between the bag of ice and your skin. °· Rest the injured joint as much as possible, but continue to put the joint through a full range of motion, 4 times per day. (The shoulder joint especially becomes rapidly "frozen" if not used.) When the pain lessens, begin normal slow movements and usual activities. °· Only take over-the-counter or prescription medicines for pain, discomfort or fever as directed by your caregiver. °· Your caregiver may recommend draining the bursa and injecting medicine into the bursa. This may help the healing process. °· Follow all instructions for follow-up with your caregiver. This includes any orthopedic referrals, physical therapy and rehabilitation. Any delay in obtaining necessary care could result in a delay or failure of the bursitis to heal and chronic pain. °SEEK IMMEDIATE MEDICAL CARE IF:  °· Your pain increases even during treatment. °· You develop an oral temperature above 102° F (38.9° C) and have heat and inflammation over the involved bursa. °MAKE SURE YOU:  °· Understand these instructions. °· Will watch your condition. °· Will get help right away if you are not doing well or get worse. °Document Released: 10/01/2000 Document Revised: 12/27/2011 Document Reviewed: 12/24/2013 °ExitCare® Patient Information ©2015 [employer]. This information is not intended to replace advice given to you by your health care provider. Make sure you discuss any questions you have with your health care provider. ° °

## 2015-03-22 NOTE — Progress Notes (Signed)
Orthopedic Tech Progress Note Patient Details:  Jason Spence [birth year] [id number]  Ortho Devices Type of Ortho Device: Arm sling, Post (long arm) splint Ortho Device/Splint Location: LUE Ortho Device/Splint Interventions: Application   [name] 03/22/2015, 3:25 PM

## 2015-03-22 NOTE — ED Notes (Signed)
Patient returned from X-ray 

## 2015-03-22 NOTE — ED Provider Notes (Signed)
CSN: [id number]     Arrival date & time 03/22/15  1110 History   First MD Initiated Contact with Patient 03/22/15 1116     Chief Complaint  Patient presents with  . Arm Pain     (Consider location/radiation/quality/duration/timing/severity/associated sxs/prior Treatment) HPI Comments: L elbow and wrist pain after using a pushmower 2 days ago. Hx of bursitis in an upper extremity (can't remember which joint), feels similar.  Patient is a 74 y.o. male presenting with arm pain. The history is provided by the patient.  Arm Pain This is a recurrent (had bursitis previously) problem. The current episode started 2 days ago. The problem occurs constantly. The problem has been gradually worsening. Associated symptoms include headaches. Pertinent negatives include no chest pain, no abdominal pain and no shortness of breath. Nothing aggravates the symptoms. Nothing relieves the symptoms.    Past Medical History  Diagnosis Date  . Hypercholesteremia   . Hypertension   . Arthritis   . Myocardial infarct   . Anal stricture 2008   Past Surgical History  Procedure Laterality Date  . Hernia repair    . Coronary artery bypass graft    . Colonoscopy with esophagogastroduodenoscopy (egd) N/A 01/05/2013    Procedure: COLONOSCOPY WITH ESOPHAGOGASTRODUODENOSCOPY (EGD);  Surgeon: Rogene Houston, MD;  Location: [facility];  Service: Endoscopy;  Laterality: N/A;  100  . Balloon dilation N/A 01/05/2013    Procedure: BALLOON DILATION;  Surgeon: Rogene Houston, MD;  Location: [facility];  Service: Endoscopy;  Laterality: N/A;   Family History  Problem Relation Age of Onset  . Hypertension Sister    History  Substance Use Topics  . Smoking status: Former Smoker -- 0.50 packs/day    Quit date: 08/04/1995  . Smokeless tobacco: Not on file  . Alcohol Use: No    Review of Systems  Constitutional: Negative for fever.  Respiratory: Negative for cough and shortness of breath.   Cardiovascular:  Negative for chest pain.  Gastrointestinal: Negative for abdominal pain.  Neurological: Positive for headaches.  All other systems reviewed and are negative.     Allergies  Review of patient's allergies indicates no known allergies.  Home Medications   Prior to Admission medications   Medication Sig Start Date End Date Taking? Authorizing Provider  amLODipine (NORVASC) 10 MG tablet Take 1 tablet (10 mg total) by mouth daily. 01/30/15   Kathyrn Drown, MD  aspirin 81 MG tablet Take 81 mg by mouth daily.     Historical Provider, MD  CINNAMON PO Take by mouth daily.    Historical Provider, MD  fexofenadine (ALLEGRA) 30 MG tablet Take 30 mg by mouth as needed.     Historical Provider, MD  fish oil-omega-3 fatty acids 1000 MG capsule Take 1 g by mouth daily.     Historical Provider, MD  fluticasone (FLONASE) 50 MCG/ACT nasal spray Place 2 sprays into the nose daily. 07/12/13   Kathyrn Drown, MD  ibuprofen (MOTRIN IB) 600 MG tablet Take 1 tablet (600 mg total) by mouth every 8 (eight) hours as needed for pain. 08/05/12   Thurnell Lose, MD  ketoconazole (NIZORAL) 2 % cream Apply 1 application topically 2 (two) times daily. 08/21/14   Kathyrn Drown, MD  Multiple Vitamin (MULTIVITAMIN WITH MINERALS) TABS Take 1 tablet by mouth daily.    Historical Provider, MD  omeprazole (PRILOSEC) 20 MG capsule Take 1 capsule (20 mg total) by mouth daily as needed. Acid reflux 01/10/14  Kathyrn Drown, MD  pravastatin (PRAVACHOL) 40 MG tablet Take 1 tablet (40 mg total) by mouth daily. 01/09/15   Kathyrn Drown, MD  ramipril (ALTACE) 10 MG capsule Take 1 capsule (10 mg total) by mouth daily. 01/09/15   Kathyrn Drown, MD   BP 160/77 mmHg  Pulse 61  Temp(Src) 99.7 F (37.6 C) (Oral)  Resp 16  Ht 5\' 8"  (1.727 m)  SpO2 99% Physical Exam  Constitutional: He is oriented to person, place, and time. He appears well-developed and well-nourished. No distress.  HENT:  Head: Normocephalic and atraumatic.   Mouth/Throat: No oropharyngeal exudate.  Eyes: EOM are normal. Pupils are equal, round, and reactive to light.  Neck: Normal range of motion. Neck supple.  Cardiovascular: Normal rate and regular rhythm.  Exam reveals no friction rub.   No murmur heard. Pulmonary/Chest: Effort normal and breath sounds normal. No respiratory distress. He has no wheezes. He has no rales.  Abdominal: He exhibits no distension. There is no tenderness. There is no rebound.  Musculoskeletal: Normal range of motion. He exhibits no edema.       Left elbow: He exhibits effusion (mild, at lateral epicondyle. No swelling at medial condyle or olecranon). Tenderness found. Radial head and lateral epicondyle tenderness noted. No medial epicondyle and no olecranon process tenderness noted.  Neurological: He is alert and oriented to person, place, and time.  Skin: He is not diaphoretic.  Nursing note and vitals reviewed.   ED Course  Procedures (including critical care time) Labs Review Labs Reviewed  CBC - Abnormal; Notable for the following:    Hemoglobin 12.0 (*)    HCT 38.8 (*)    MCV 74.6 (*)    MCH 23.1 (*)    RDW 16.1 (*)    All other components within normal limits  BASIC METABOLIC PANEL    Imaging Review Dg Elbow Complete Left  03/22/2015   CLINICAL DATA:  Elbow pain and swelling for 2 days. Left elbow bursitis. No known injury.  EXAM: LEFT ELBOW - COMPLETE 3+ VIEW  COMPARISON:  None.  FINDINGS: A moderate elbow joint effusion is seen. Mild to moderate osteoarthritis is seen involving both medial and lateral compartments. No evidence of acute fracture or dislocation.  IMPRESSION: Moderate elbow joint effusion and mild to moderate osteoarthritis. No acute fracture visualized. Consider follow-up radiographs in 7 days if there is clinical concern for occult fracture.   Electronically Signed   By: Earle Gell M.D.   On: 03/22/2015 12:21     EKG Interpretation None      MDM   Final diagnoses:  Elbow  pain  Bursitis of elbow, left    74 year old male here with left elbow pain. Worsening for the past 2 days. Began after using a push mower to mow the yard. History of bursitis in elbow 04, care of her Murphy's this elbow. No fevers, chest pain, shortness of breath. L elbow with lateral swelling and tenderness. Decreased ROM past 90 degrees of flexion. No erythema. No medial epicondyle tenderness. Olecranon bursa not swollen. States most of his pain in his lower tricep. Will check labs and xray. Likely bursitis, doubt septic arthritis at this time. Xray with mild effusion. Labs normal. I spoke with Dr. Berenice Primas, he recommended a splint and f/u in 2 days. Given pain meds. Stable for discharge.    Evelina Bucy, MD 03/22/15 [id number]

## 2015-05-22 ENCOUNTER — Other Ambulatory Visit: Payer: Self-pay | Admitting: Family Medicine

## 2015-05-22 MED ORDER — RAMIPRIL 10 MG PO CAPS: 10.0000 mg | ORAL_CAPSULE | Freq: Every day | ORAL | Status: DC

## 2015-05-22 MED ORDER — PRAVASTATIN SODIUM 40 MG PO TABS
40.0000 mg | ORAL_TABLET | Freq: Every day | ORAL | Status: DC
Start: 2015-05-22 — End: 2016-03-10

## 2015-05-22 NOTE — Progress Notes (Signed)
Medication refills were provided for the patient per his request

## 2015-06-27 ENCOUNTER — Other Ambulatory Visit: Payer: Self-pay | Admitting: *Deleted

## 2015-06-27 DIAGNOSIS — Z79899 Other long term (current) drug therapy: Secondary | ICD-10-CM

## 2015-06-27 DIAGNOSIS — Z125 Encounter for screening for malignant neoplasm of prostate: Secondary | ICD-10-CM

## 2015-06-27 DIAGNOSIS — D509 Iron deficiency anemia, unspecified: Secondary | ICD-10-CM

## 2015-06-27 DIAGNOSIS — E785 Hyperlipidemia, unspecified: Secondary | ICD-10-CM

## 2015-06-30 LAB — CBC WITH DIFFERENTIAL/PLATELET
Basophils Absolute: 0.1 10*3/uL (ref 0.0–0.2)
Basos: 1 %
EOS (ABSOLUTE): 0.8 10*3/uL — ABNORMAL HIGH (ref 0.0–0.4)
Eos: 9 %
Hematocrit: 39 % (ref 37.5–51.0)
Hemoglobin: 12.7 g/dL (ref 12.6–17.7)
IMMATURE GRANS (ABS): 0 10*3/uL (ref 0.0–0.1)
Immature Granulocytes: 0 %
LYMPHS ABS: 1.6 10*3/uL (ref 0.7–3.1)
LYMPHS: 18 %
MCH: 25.9 pg — ABNORMAL LOW (ref 26.6–33.0)
MCHC: 32.6 g/dL (ref 31.5–35.7)
MCV: 79 fL (ref 79–97)
MONOCYTES: 10 %
Monocytes Absolute: 0.8 10*3/uL (ref 0.1–0.9)
Neutrophils Absolute: 5.4 10*3/uL (ref 1.4–7.0)
Neutrophils: 62 %
Platelets: 240 10*3/uL (ref 150–379)
RBC: 4.91 x10E6/uL (ref 4.14–5.80)
RDW: 18.2 % — ABNORMAL HIGH (ref 12.3–15.4)
WBC: 8.6 10*3/uL (ref 3.4–10.8)

## 2015-06-30 LAB — LIPID PANEL
Chol/HDL Ratio: 3.2 ratio units (ref 0.0–5.0)
Cholesterol, Total: 140 mg/dL (ref 100–199)
HDL: 44 mg/dL (ref >39)
LDL Calculated: 70 mg/dL (ref 0–99)
Triglycerides: 132 mg/dL (ref 0–149)
VLDL Cholesterol Cal: 26 mg/dL (ref 5–40)

## 2015-06-30 LAB — BASIC METABOLIC PANEL
BUN/Creatinine Ratio: 16 (ref 10–22)
BUN: 13 mg/dL (ref 8–27)
CALCIUM: 9.3 mg/dL (ref 8.6–10.2)
CO2: 24 mmol/L (ref 18–29)
CREATININE: 0.81 mg/dL (ref 0.76–1.27)
Chloride: 101 mmol/L (ref 97–108)
GFR calc Af Amer: 101 mL/min/{1.73_m2} (ref >59)
GFR calc non Af Amer: 87 mL/min/{1.73_m2} (ref >59)
Glucose: 100 mg/dL — ABNORMAL HIGH (ref 65–99)
Potassium: 4.2 mmol/L (ref 3.5–5.2)
Sodium: 143 mmol/L (ref 134–144)

## 2015-06-30 LAB — PSA: PROSTATE SPECIFIC AG, SERUM: 2.8 ng/mL (ref 0.0–4.0)

## 2015-06-30 LAB — HEPATIC FUNCTION PANEL
ALBUMIN: 4.3 g/dL (ref 3.5–4.8)
ALT: 17 IU/L (ref 0–44)
AST: 23 IU/L (ref 0–40)
Alkaline Phosphatase: 80 IU/L (ref 39–117)
Bilirubin Total: 0.4 mg/dL (ref 0.0–1.2)
Bilirubin, Direct: 0.12 mg/dL (ref 0.00–0.40)
TOTAL PROTEIN: 7.5 g/dL (ref 6.0–8.5)

## 2015-06-30 LAB — FERRITIN: Ferritin: 11 ng/mL — ABNORMAL LOW (ref 30–400)

## 2015-07-01 ENCOUNTER — Ambulatory Visit (INDEPENDENT_AMBULATORY_CARE_PROVIDER_SITE_OTHER): Payer: Medicare HMO | Admitting: Family Medicine

## 2015-07-01 ENCOUNTER — Encounter: Payer: Self-pay | Admitting: Family Medicine

## 2015-07-01 VITALS — BP 138/74 | Ht 64.0 in | Wt 139.0 lb

## 2015-07-01 DIAGNOSIS — E785 Hyperlipidemia, unspecified: Secondary | ICD-10-CM

## 2015-07-01 DIAGNOSIS — K219 Gastro-esophageal reflux disease without esophagitis: Secondary | ICD-10-CM

## 2015-07-01 DIAGNOSIS — D509 Iron deficiency anemia, unspecified: Secondary | ICD-10-CM | POA: Diagnosis not present

## 2015-07-01 DIAGNOSIS — I10 Essential (primary) hypertension: Secondary | ICD-10-CM | POA: Diagnosis not present

## 2015-07-01 DIAGNOSIS — Z23 Encounter for immunization: Secondary | ICD-10-CM

## 2015-07-01 NOTE — Patient Instructions (Addendum)
Iron tablet daily- 325 mg one per day  May need to take a fiber supplement if iron causes constipation  Heme cards  We will recheck your blood count and ferritin in a couple months   Please check BP 3 times a week for the next 4 weeks then send me the readings

## 2015-07-01 NOTE — Progress Notes (Signed)
   Subjective:    Patient ID: Jason Spence, male    DOB: [birth year], 74 y.o.   MRN: [id number]  Hypertension This is a chronic problem. Pertinent negatives include no chest pain, headaches or shortness of breath.  pt stays active. Walks. Eats healthy.   Concerns about Having left jaw pain and dizziness. Started Thursday.  Felt like room was spinning Some with position change  he denies any chest tightness pressure pain or shortness of breath.  patient relates he takes his cholesterol medicine on regular basis watch his diet  patient denies any prostate cancer symptoms no hematuria no difficulty urinating  patient relates no significant fatigue or tiredness.  he states reflux under good control with only have any use it occasionally  patient having some symptoms of stress in fatigue related to the care of his wife He denies any chest pressure tightness pain no shortness of breath with activity Review of Systems  Constitutional: Negative for activity change, appetite change and fatigue.  HENT: Negative for congestion.   Respiratory: Negative for cough and shortness of breath.   Cardiovascular: Negative for chest pain.  Gastrointestinal: Negative for abdominal pain.  Endocrine: Negative for polydipsia and polyphagia.  Neurological: Positive for dizziness and light-headedness. Negative for weakness and headaches.  Psychiatric/Behavioral: Negative for confusion.       Objective:   Physical Exam  Constitutional: He appears well-nourished. No distress.  Cardiovascular: Normal rate, regular rhythm and normal heart sounds.   No murmur heard. Pulmonary/Chest: Effort normal and breath sounds normal. No respiratory distress.  Musculoskeletal: He exhibits no edema.  Lymphadenopathy:    He has no cervical adenopathy.  Neurological: He is alert.  Psychiatric: His behavior is normal.  Vitals reviewed.    greater than 25 minutes was spent reviewing his labs with him reviewing his  medications also reviewing over the symptoms he is having with the dizziness as well as the left jaw pain greater than half in discussion of questions      Assessment & Plan:   history of our deficiency with recent ferritin below I recommend checking occult cards he is up-to-date on his colonoscopy.  patient with intermittent dizziness probably inner ear dysfunction I don't find any focal findings of a stroke today  blood pressure good control no orthostasis continue current medications  hyperlipidemia he does take his medicine on a regular basis.  glucose resting glucose slightly elevated he was in encouraged to minimize starches in the diet  PSA normal no sign a cancer  not anemic Ferritin low see above discussion  follow-up 3 months sooner problems

## 2015-07-22 ENCOUNTER — Encounter: Payer: Self-pay | Admitting: Family Medicine

## 2015-07-22 ENCOUNTER — Other Ambulatory Visit: Payer: Self-pay

## 2015-07-22 DIAGNOSIS — D509 Iron deficiency anemia, unspecified: Secondary | ICD-10-CM

## 2015-07-22 LAB — POC HEMOCCULT BLD/STL (HOME/3-CARD/SCREEN)
Card #3 Fecal Occult Blood, POC: NEGATIVE
FECAL OCCULT BLD: NEGATIVE
Fecal Occult Blood, POC: NEGATIVE

## 2015-09-29 ENCOUNTER — Encounter: Payer: Self-pay | Admitting: Family Medicine

## 2015-09-29 ENCOUNTER — Ambulatory Visit (INDEPENDENT_AMBULATORY_CARE_PROVIDER_SITE_OTHER): Payer: Medicare HMO | Admitting: Family Medicine

## 2015-09-29 VITALS — BP 130/72 | Ht 64.0 in | Wt 139.1 lb

## 2015-09-29 DIAGNOSIS — I951 Orthostatic hypotension: Secondary | ICD-10-CM | POA: Diagnosis not present

## 2015-09-29 DIAGNOSIS — R634 Abnormal weight loss: Secondary | ICD-10-CM | POA: Diagnosis not present

## 2015-09-29 DIAGNOSIS — I10 Essential (primary) hypertension: Secondary | ICD-10-CM

## 2015-09-29 NOTE — Progress Notes (Signed)
   Subjective:    Patient ID: Jason Spence, male    DOB: 04-22-1941, 74 y.o.   MRN: [id number]  Hypertension This is a chronic problem. The current episode started more than 1 year ago. The problem has been gradually improving since onset. There are no associated agents to hypertension. There are no known risk factors for coronary artery disease. Treatments tried: amlodipine. The current treatment provides moderate improvement. There are no compliance problems.    Patient states that he has been experiencing dizziness since Friday night. No other symptoms.   Patient having dizziness when he stands out when he gets up when he moves around denies any chest tightness pressure pain nausea vomiting   Review of Systems    denies cough wheezing difficulty breathing not eating as much because he is going through a lot of stress with his wife's sickness Objective:   Physical Exam Lungs clear heart regular pulse normal Blood pressure taken both laying sitting standing there was moderate dropped from the 140/78 all the way down to about a 118/70       Assessment & Plan:  Orthostatic hypotension reduce amlodipine half of a tablet daily HTN decent control otherwise Situational stress wife is at hospice currently poor outlook

## 2015-09-30 ENCOUNTER — Ambulatory Visit: Payer: Medicare HMO | Admitting: Family Medicine

## 2015-09-30 ENCOUNTER — Encounter: Payer: Self-pay | Admitting: Family Medicine

## 2015-09-30 LAB — CBC WITH DIFFERENTIAL/PLATELET
BASOS ABS: 0.1 10*3/uL (ref 0.0–0.2)
Basos: 1 %
EOS (ABSOLUTE): 1 10*3/uL — ABNORMAL HIGH (ref 0.0–0.4)
EOS: 10 %
HEMATOCRIT: 43.1 % (ref 37.5–51.0)
Hemoglobin: 14.5 g/dL (ref 12.6–17.7)
Immature Grans (Abs): 0 10*3/uL (ref 0.0–0.1)
Immature Granulocytes: 0 %
Lymphocytes Absolute: 2.1 10*3/uL (ref 0.7–3.1)
Lymphs: 21 %
MCH: 28.2 pg (ref 26.6–33.0)
MCHC: 33.6 g/dL (ref 31.5–35.7)
MCV: 84 fL (ref 79–97)
MONOCYTES: 10 %
Monocytes Absolute: 0.9 10*3/uL (ref 0.1–0.9)
Neutrophils Absolute: 5.7 10*3/uL (ref 1.4–7.0)
Neutrophils: 58 %
Platelets: 277 10*3/uL (ref 150–379)
RBC: 5.15 x10E6/uL (ref 4.14–5.80)
RDW: 15.1 % (ref 12.3–15.4)
WBC: 9.8 10*3/uL (ref 3.4–10.8)

## 2015-09-30 LAB — BASIC METABOLIC PANEL
BUN/Creatinine Ratio: 18 (ref 10–22)
BUN: 15 mg/dL (ref 8–27)
CO2: 25 mmol/L (ref 18–29)
CREATININE: 0.84 mg/dL (ref 0.76–1.27)
Calcium: 9.5 mg/dL (ref 8.6–10.2)
Chloride: 100 mmol/L (ref 96–106)
GFR calc Af Amer: 99 mL/min/{1.73_m2} (ref >59)
GFR calc non Af Amer: 86 mL/min/{1.73_m2} (ref >59)
GLUCOSE: 92 mg/dL (ref 65–99)
Potassium: 4.5 mmol/L (ref 3.5–5.2)
Sodium: 145 mmol/L — ABNORMAL HIGH (ref 134–144)

## 2015-10-01 ENCOUNTER — Telehealth: Payer: Self-pay | Admitting: Family Medicine

## 2015-10-01 ENCOUNTER — Encounter: Payer: Self-pay | Admitting: Family Medicine

## 2015-10-01 ENCOUNTER — Ambulatory Visit (INDEPENDENT_AMBULATORY_CARE_PROVIDER_SITE_OTHER): Payer: Medicare HMO | Admitting: Family Medicine

## 2015-10-01 VITALS — Wt 139.0 lb

## 2015-10-01 DIAGNOSIS — H8113 Benign paroxysmal vertigo, bilateral: Secondary | ICD-10-CM

## 2015-10-01 DIAGNOSIS — R002 Palpitations: Secondary | ICD-10-CM

## 2015-10-01 DIAGNOSIS — R42 Dizziness and giddiness: Secondary | ICD-10-CM

## 2015-10-01 MED ORDER — MECLIZINE HCL 25 MG PO TABS: 25.0000 mg | ORAL_TABLET | Freq: Three times a day (TID) | ORAL | Status: DC | PRN

## 2015-10-01 NOTE — Telephone Encounter (Signed)
Discussed patient call with Dr.[name] [name] in real time and was told to have patient come in today. Called and informed patient's daughter. [name] verbalized understanding.

## 2015-10-01 NOTE — Patient Instructions (Signed)
Labyrinthitis Labyrinthitis is an infection of the inner ear. Your inner ear is a system of tubes and canals (labyrinth). These are filled with fluid. Nerve cells in your inner ear send signals for hearing and balance to your brain. When tiny germs get inside the tubes and canals, they harm the cells that send messages to the brain. This can cause changes in hearing and balance. HOME CARE INSTRUCTIONS  Take medicines only as told by your doctor.  If you were prescribed an antibiotic medicine, finish all of it even if you start to feel better.  Rest as much as possible.  Avoid loud noises and bright lights.  Do not make sudden movements until any dizziness goes away.  Do not drive until your doctor says that you can.  Drink enough fluid to keep your pee (urine) clear or pale yellow.  Work with a physical therapist if you still feel dizzy after several weeks. A therapist can teach you exercises to help you deal with your dizziness.  Keep all follow-up visits as told by your doctor. This is important. GET HELP IF:  Your symptoms do not get better with medicines.  You do not get better after two weeks.  You have a fever. GET HELP RIGHT AWAY IF:  You are very dizzy.  You keep throwing up (vomiting) or keep feeling sick to your stomach (nauseous).  Your hearing gets a lot worse very quickly.   This information is not intended to replace advice given to you by your health care provider. Make sure you discuss any questions you have with your health care provider.   Document Released: 10/04/2005 Document Revised: 10/25/2014 Document Reviewed: 07/16/2014 Elsevier Interactive Patient Education 2016 [employer]  

## 2015-10-01 NOTE — Progress Notes (Signed)
   Subjective:    Patient ID: Jason Spence, male    DOB: 1941/03/06, 74 y.o.   MRN: [id number]  Dizziness This is a new problem. The current episode started 1 to 4 weeks ago. The problem occurs intermittently. The problem has been unchanged. Nothing aggravates the symptoms. He has tried nothing for the symptoms. The treatment provided no relief.   PMH benign. He describes dizziness when he rolls over one tries to sit up. Describes it as room spinning sensation. States at times he feels like her heart skips a beat but it doesn't know for certain ECG completed.   Review of Systems  Neurological: Positive for dizziness.   patient states at times he's felt very dizzy when he rolls over when he sits up his family was concerned about irregular heartbeat they were concerned about possibility of atrial fibrillation they want him to get checked. He is not had any cough chest tightness pressure pain     Objective:   Physical Exam  neck no bruits no masses lungs are clear no crackles heart is regular abdomen soft blood pressure is good extremities no edema   subjective vertigo when he lays down and tries to sit back feels like room spinning       Assessment & Plan:   vertigo/inner ear-Antivert when necessary / slowed movements when rolling over and sitting up. If persistent symptoms in the next week referral to ENT.   No sign of any stroke currently   I find no evidence of any underlying cardiovascular issues

## 2015-10-01 NOTE — Telephone Encounter (Signed)
pts daughter calling to saying that he is still quite dizzy an thinks he possibly  Needs to be seen again. She don't think its his BP meds, she feels it is his  afib more so than anything. She feels concerned for him at this point.   [phone] daughter April

## 2015-10-01 NOTE — Telephone Encounter (Signed)
Called and verified with patient that he is having c/o dizziness and is wanting to be seen. Patient verbalized understanding and stated that he would like to be seen. Transferred patient to front desk for appointment.

## 2015-10-22 ENCOUNTER — Encounter: Payer: Self-pay | Admitting: Family Medicine

## 2015-10-22 ENCOUNTER — Ambulatory Visit (INDEPENDENT_AMBULATORY_CARE_PROVIDER_SITE_OTHER): Payer: Medicare HMO | Admitting: Family Medicine

## 2015-10-22 VITALS — BP 138/80 | Temp 98.4°F | Ht 64.0 in | Wt 138.0 lb

## 2015-10-22 DIAGNOSIS — R42 Dizziness and giddiness: Secondary | ICD-10-CM | POA: Diagnosis not present

## 2015-10-22 DIAGNOSIS — I10 Essential (primary) hypertension: Secondary | ICD-10-CM | POA: Diagnosis not present

## 2015-10-22 DIAGNOSIS — J329 Chronic sinusitis, unspecified: Secondary | ICD-10-CM | POA: Diagnosis not present

## 2015-10-22 MED ORDER — AMOXICILLIN 500 MG PO TABS: 500.0000 mg | ORAL_TABLET | Freq: Three times a day (TID) | ORAL | Status: DC

## 2015-10-22 MED ORDER — AMLODIPINE BESYLATE 5 MG PO TABS: 5.0000 mg | ORAL_TABLET | Freq: Every day | ORAL | Status: DC

## 2015-10-22 NOTE — Progress Notes (Signed)
   Subjective:    Patient ID: Jason Spence, male    DOB: Jun 21, 1941, 75 y.o.   MRN: [id number]  HPIFollow up on dizziness. Pt states dizziness is gone.   Having stuffy nose in the am and sinus drainage down throat. Started about one week ago.   significant head congestion drainage coughing sinus pressure denies high fever chills or sweats   Having significant fatigue and tiredness related to not sleeping well from taking care of his wife is in hospice   Review of Systems  Constitutional: Negative for fever and activity change.  HENT: Positive for congestion and rhinorrhea. Negative for ear pain.   Eyes: Negative for discharge.  Respiratory: Positive for cough. Negative for wheezing.   Cardiovascular: Negative for chest pain.       Objective:   Physical Exam  Constitutional: He appears well-developed.  HENT:  Head: Normocephalic.  Mouth/Throat: Oropharynx is clear and moist. No oropharyngeal exudate.  Neck: Normal range of motion.  Cardiovascular: Normal rate, regular rhythm and normal heart sounds.   No murmur heard. Pulmonary/Chest: Effort normal and breath sounds normal. He has no wheezes.  Lymphadenopathy:    He has no cervical adenopathy.  Neurological: He exhibits normal muscle tone.  Skin: Skin is warm and dry.  Nursing note and vitals reviewed.         Assessment & Plan:   HTN good control blood pressure checked both sitting and standing no significant drop Dizziness resolved Significant fatigue and tiredness from taking care of his [facility] who is in hospice. Patient was encouraged to get at least 1 or 2 nights a week of good sleep hopefully his family can help with this.   Mild sinus infection antibiotics prescribed

## 2016-01-15 ENCOUNTER — Ambulatory Visit (INDEPENDENT_AMBULATORY_CARE_PROVIDER_SITE_OTHER): Payer: Medicare HMO | Admitting: Family Medicine

## 2016-01-15 ENCOUNTER — Encounter: Payer: Self-pay | Admitting: Family Medicine

## 2016-01-15 VITALS — BP 122/86 | Ht 64.0 in | Wt 142.0 lb

## 2016-01-15 DIAGNOSIS — R21 Rash and other nonspecific skin eruption: Secondary | ICD-10-CM | POA: Diagnosis not present

## 2016-01-15 NOTE — Progress Notes (Signed)
   Subjective:    Patient ID: Jason Spence, male    DOB: [birth year], 75 y.o.   MRN: [id number]  HPI Patient arrives with c/o 3 separate tick bites on 01/13/16. One bite on left hip, left leg and left wrist-patient has removed the ticks but is concerned because he was in the hospital a few years ago after a tick bite.  Walking dog around the yard. Gets out on the edge of the would some.  Did have a hospitalization after tick bite with what may have been a tick related illness several years ago. Understandably and see about tick bites.  These 3 recent bites which occurred on 2 separate days have led to localize itching swelling irritation. The left anterior thigh bite has more of a rash  Review of Systems No headache no fever no chills    Objective:   Physical Exam   Alert vital stable HEENT normal lungs clear heart rare rhythm several distinct erythematous papules at site of bite left anterior thigh reveals patchy irritated hypertrophic erythematous rash very pruritic     Assessment & Plan:  Impression multiple tick bites with secondary local irritation/allergenic reaction discussed at length plan warning signs regarding infection discussed. May use over-the-counter agents symptom care discussed WSL

## 2016-01-19 DIAGNOSIS — R69 Illness, unspecified: Secondary | ICD-10-CM | POA: Diagnosis not present

## 2016-02-05 ENCOUNTER — Encounter: Payer: Self-pay | Admitting: Nurse Practitioner

## 2016-02-05 ENCOUNTER — Ambulatory Visit (INDEPENDENT_AMBULATORY_CARE_PROVIDER_SITE_OTHER): Payer: Medicare HMO | Admitting: Nurse Practitioner

## 2016-02-05 VITALS — BP 118/80 | Temp 99.2°F | Ht 64.0 in | Wt 137.2 lb

## 2016-02-05 DIAGNOSIS — J0110 Acute frontal sinusitis, unspecified: Secondary | ICD-10-CM | POA: Diagnosis not present

## 2016-02-05 MED ORDER — AMOXICILLIN-POT CLAVULANATE 875-125 MG PO TABS: 1.0000 | ORAL_TABLET | Freq: Two times a day (BID) | ORAL | Status: DC

## 2016-02-05 NOTE — Patient Instructions (Signed)
Zyrtec and Nasacort AQ as directed Zaditor eye drops

## 2016-02-06 ENCOUNTER — Encounter: Payer: Self-pay | Admitting: Nurse Practitioner

## 2016-02-06 NOTE — Progress Notes (Signed)
Subjective:  Presents for complaints of sinus symptoms for the past 2-3 days. No fever. Sore throat. Frontal area headache. Head congestion. Occasional nonproductive cough worse at nighttime. No wheezing. No ear pain. Sneezing. Itchy watery eyes. States he does not feel well today. Feels tired.  Objective:   BP 118/80 mmHg  Temp(Src) 99.2 F (37.3 C) (Oral)  Ht 5\' 4"  (1.626 m)  Wt 137 lb 4 oz (62.256 kg)  BMI 23.55 kg/m2 NAD. Alert, oriented. TMs clear effusion, no erythema. Pharynx mildly injected with a large amount of thick green mucus PND noted. Neck supple with mild soft anterior adenopathy. Lungs clear. No wheezing or tachypnea. Heart regular rate rhythm. Skin warm to the touch.   Assessment: Acute frontal sinusitis, recurrence not specified  Plan:  Meds ordered this encounter  Medications  . amoxicillin-clavulanate (AUGMENTIN) 875-125 MG tablet    Sig: Take 1 tablet by mouth 2 (two) times daily.    Dispense:  20 tablet    Refill:  0    Order Specific Question:  Supervising Provider    Answer:  Mikey Kirschner [[id number]]   OTC meds as directed for congestion cough and allergy symptoms. Reviewed symptomatic care and warning signs. Call back in 4 days if no improvement, sooner if worse.

## 2016-02-19 ENCOUNTER — Ambulatory Visit: Payer: Medicare HMO | Admitting: Family Medicine

## 2016-03-10 ENCOUNTER — Encounter: Payer: Self-pay | Admitting: Family Medicine

## 2016-03-10 ENCOUNTER — Ambulatory Visit (INDEPENDENT_AMBULATORY_CARE_PROVIDER_SITE_OTHER): Payer: Medicare HMO | Admitting: Family Medicine

## 2016-03-10 VITALS — BP 134/66 | Ht 64.0 in | Wt 134.2 lb

## 2016-03-10 DIAGNOSIS — I10 Essential (primary) hypertension: Secondary | ICD-10-CM

## 2016-03-10 MED ORDER — RAMIPRIL 10 MG PO CAPS: 10.0000 mg | ORAL_CAPSULE | Freq: Every day | ORAL | Status: DC

## 2016-03-10 MED ORDER — PRAVASTATIN SODIUM 40 MG PO TABS: 40.0000 mg | ORAL_TABLET | Freq: Every day | ORAL | Status: DC

## 2016-03-10 NOTE — Progress Notes (Signed)
   Subjective:    Patient ID: Jason Spence, male    DOB: 04-Nov-1940, 76 y.o.   MRN: [id number]  Hypertension This is a chronic problem. The current episode started more than 1 year ago. Pertinent negatives include no chest pain. Risk factors for coronary artery disease include dyslipidemia and male gender. Treatments tried: altace, norvasc. There are no compliance problems.     Taking his medicines Under some stress because of his wife slowly dying of illness  Review of Systems  Constitutional: Negative for activity change, appetite change and fatigue.  HENT: Negative for congestion.   Respiratory: Negative for cough.   Cardiovascular: Negative for chest pain.  Gastrointestinal: Negative for abdominal pain.  Endocrine: Negative for polydipsia and polyphagia.  Neurological: Negative for weakness.  Psychiatric/Behavioral: Negative for confusion.       Objective:   Physical Exam Lungs clear heart regular pulse normal BP good extremities no edema       Assessment & Plan:  Hypertension good control continue current measures lab work in the fall time with office visit indicated Patient is having a lot of stress and grieving because his wife is in the dining phases of a protracted illness

## 2016-03-18 DIAGNOSIS — L570 Actinic keratosis: Secondary | ICD-10-CM | POA: Diagnosis not present

## 2016-03-22 DIAGNOSIS — Z029 Encounter for administrative examinations, unspecified: Secondary | ICD-10-CM

## 2016-04-08 ENCOUNTER — Other Ambulatory Visit: Payer: Self-pay | Admitting: Family Medicine

## 2016-06-16 DIAGNOSIS — H524 Presbyopia: Secondary | ICD-10-CM | POA: Diagnosis not present

## 2016-06-16 DIAGNOSIS — H3530 Unspecified macular degeneration: Secondary | ICD-10-CM | POA: Diagnosis not present

## 2016-06-16 DIAGNOSIS — H52223 Regular astigmatism, bilateral: Secondary | ICD-10-CM | POA: Diagnosis not present

## 2016-06-16 DIAGNOSIS — H5203 Hypermetropia, bilateral: Secondary | ICD-10-CM | POA: Diagnosis not present

## 2016-06-17 DIAGNOSIS — Z0100 Encounter for examination of eyes and vision without abnormal findings: Secondary | ICD-10-CM | POA: Diagnosis not present

## 2016-06-22 ENCOUNTER — Encounter: Payer: Self-pay | Admitting: Family Medicine

## 2016-06-22 ENCOUNTER — Ambulatory Visit (INDEPENDENT_AMBULATORY_CARE_PROVIDER_SITE_OTHER): Payer: Medicare HMO | Admitting: Family Medicine

## 2016-06-22 VITALS — BP 120/78 | Ht 64.0 in | Wt 139.0 lb

## 2016-06-22 DIAGNOSIS — R42 Dizziness and giddiness: Secondary | ICD-10-CM | POA: Diagnosis not present

## 2016-06-22 DIAGNOSIS — H8113 Benign paroxysmal vertigo, bilateral: Secondary | ICD-10-CM

## 2016-06-22 DIAGNOSIS — R002 Palpitations: Secondary | ICD-10-CM | POA: Diagnosis not present

## 2016-06-22 DIAGNOSIS — I10 Essential (primary) hypertension: Secondary | ICD-10-CM | POA: Diagnosis not present

## 2016-06-22 DIAGNOSIS — H8110 Benign paroxysmal vertigo, unspecified ear: Secondary | ICD-10-CM | POA: Diagnosis not present

## 2016-06-22 DIAGNOSIS — Z13220 Encounter for screening for lipoid disorders: Secondary | ICD-10-CM | POA: Diagnosis not present

## 2016-06-22 DIAGNOSIS — Z125 Encounter for screening for malignant neoplasm of prostate: Secondary | ICD-10-CM

## 2016-06-22 MED ORDER — MECLIZINE HCL 25 MG PO TABS: 25.0000 mg | ORAL_TABLET | Freq: Three times a day (TID) | ORAL | 1 refills | Status: DC | PRN

## 2016-06-22 MED ORDER — RAMIPRIL 10 MG PO CAPS: 20.0000 mg | ORAL_CAPSULE | Freq: Every day | ORAL | 1 refills | Status: DC

## 2016-06-22 NOTE — Progress Notes (Signed)
   Subjective:    Patient ID: Jason Spence, male    DOB: 1941-08-10, 75 y.o.   MRN: [id number]  HPI Patient arrives with c/o dizziness and vertigo for 4 days-worse at night when he moves or gets up. Patient states his ear was bothering him before it started. The vertigo makes him nauseated and headache. Patient relates when he moves around he gets dizzy feels a dizziness slight nausea no vomiting the septal vomiting that occurred on Sunday when he was eating out. Patient has had a slight headache over the past few days. Denies any fevers or chills been under a lot of stress with the loss of his wife. Patient states he's been compliant with his medicines.  Review of Systems  Constitutional: Negative for fatigue and fever.  HENT: Negative for congestion.   Eyes: Negative for photophobia and visual disturbance.  Respiratory: Negative for cough and shortness of breath.   Cardiovascular: Negative for chest pain.  Neurological: Positive for dizziness, light-headedness and headaches. Negative for speech difficulty and numbness.       Objective:   Physical Exam  Constitutional: He appears well-nourished. No distress.  Cardiovascular: Normal rate, regular rhythm and normal heart sounds.   No murmur heard. Pulmonary/Chest: Effort normal and breath sounds normal. No respiratory distress.  Musculoskeletal: He exhibits no edema.  Lymphadenopathy:    He has no cervical adenopathy.  Neurological: He is alert.  Psychiatric: His behavior is normal.  Vitals reviewed. Finger to nose normal Romberg negative patient able to walk without falling. Strength equal bilateral. I doubt a stroke.  Patient does get dizzy with laying back and tilting head to the left. Patient does not have any vertical nystagmus. Patient's blood pressure moderately elevated initially 148/90 then 142/86      Assessment & Plan:  Blood pressure subpar control increase ram a Prill use 20 mg daily follow-up within 2  weeks  Lab work ordered comprehensive follow-up in 2 weeks Vertigo in her ear if not improving over next 4 or 5 days referral to ENT Find no evidence of stroke currently Patient to follow-up sooner if any problems warning signs discussed

## 2016-06-29 DIAGNOSIS — I10 Essential (primary) hypertension: Secondary | ICD-10-CM | POA: Diagnosis not present

## 2016-06-29 DIAGNOSIS — Z125 Encounter for screening for malignant neoplasm of prostate: Secondary | ICD-10-CM | POA: Diagnosis not present

## 2016-06-29 DIAGNOSIS — Z13220 Encounter for screening for lipoid disorders: Secondary | ICD-10-CM | POA: Diagnosis not present

## 2016-06-30 ENCOUNTER — Other Ambulatory Visit: Payer: Self-pay

## 2016-06-30 DIAGNOSIS — Z79899 Other long term (current) drug therapy: Secondary | ICD-10-CM

## 2016-06-30 LAB — HEPATIC FUNCTION PANEL
ALK PHOS: 85 IU/L (ref 39–117)
ALT: 31 IU/L (ref 0–44)
AST: 36 IU/L (ref 0–40)
Albumin: 4.8 g/dL (ref 3.5–4.8)
BILIRUBIN TOTAL: 0.6 mg/dL (ref 0.0–1.2)
Bilirubin, Direct: 0.15 mg/dL (ref 0.00–0.40)
Total Protein: 8.1 g/dL (ref 6.0–8.5)

## 2016-06-30 LAB — BASIC METABOLIC PANEL
BUN/Creatinine Ratio: 17 (ref 10–24)
BUN: 15 mg/dL (ref 8–27)
CALCIUM: 10.1 mg/dL (ref 8.6–10.2)
CO2: 23 mmol/L (ref 18–29)
CREATININE: 0.9 mg/dL (ref 0.76–1.27)
Chloride: 100 mmol/L (ref 96–106)
GFR, EST AFRICAN AMERICAN: 96 mL/min/{1.73_m2} (ref >59)
GFR, EST NON AFRICAN AMERICAN: 83 mL/min/{1.73_m2} (ref >59)
Glucose: 100 mg/dL — ABNORMAL HIGH (ref 65–99)
Potassium: 5.9 mmol/L — ABNORMAL HIGH (ref 3.5–5.2)
Sodium: 144 mmol/L (ref 134–144)

## 2016-06-30 LAB — LIPID PANEL
CHOLESTEROL TOTAL: 159 mg/dL (ref 100–199)
Chol/HDL Ratio: 4.4 ratio units (ref 0.0–5.0)
HDL: 36 mg/dL — ABNORMAL LOW (ref >39)
LDL Calculated: 82 mg/dL (ref 0–99)
TRIGLYCERIDES: 206 mg/dL — AB (ref 0–149)
VLDL Cholesterol Cal: 41 mg/dL — ABNORMAL HIGH (ref 5–40)

## 2016-06-30 LAB — PSA: Prostate Specific Ag, Serum: 2.9 ng/mL (ref 0.0–4.0)

## 2016-06-30 MED ORDER — HYDROCHLOROTHIAZIDE 12.5 MG PO CAPS: 12.5000 mg | ORAL_CAPSULE | ORAL | 3 refills | Status: DC

## 2016-07-05 DIAGNOSIS — Z79899 Other long term (current) drug therapy: Secondary | ICD-10-CM | POA: Diagnosis not present

## 2016-07-06 LAB — BASIC METABOLIC PANEL
BUN/Creatinine Ratio: 22 (ref 10–24)
BUN: 17 mg/dL (ref 8–27)
CALCIUM: 9.5 mg/dL (ref 8.6–10.2)
CHLORIDE: 97 mmol/L (ref 96–106)
CO2: 27 mmol/L (ref 18–29)
Creatinine, Ser: 0.78 mg/dL (ref 0.76–1.27)
GFR calc Af Amer: 101 mL/min/{1.73_m2} (ref >59)
GFR calc non Af Amer: 88 mL/min/{1.73_m2} (ref >59)
Glucose: 96 mg/dL (ref 65–99)
Potassium: 4.4 mmol/L (ref 3.5–5.2)
Sodium: 140 mmol/L (ref 134–144)

## 2016-07-07 ENCOUNTER — Ambulatory Visit (INDEPENDENT_AMBULATORY_CARE_PROVIDER_SITE_OTHER): Payer: Medicare HMO | Admitting: Family Medicine

## 2016-07-07 ENCOUNTER — Encounter: Payer: Self-pay | Admitting: Family Medicine

## 2016-07-07 VITALS — BP 108/80 | Ht 64.0 in | Wt 137.1 lb

## 2016-07-07 DIAGNOSIS — I10 Essential (primary) hypertension: Secondary | ICD-10-CM

## 2016-07-07 DIAGNOSIS — R5383 Other fatigue: Secondary | ICD-10-CM

## 2016-07-07 DIAGNOSIS — E875 Hyperkalemia: Secondary | ICD-10-CM

## 2016-07-07 DIAGNOSIS — E785 Hyperlipidemia, unspecified: Secondary | ICD-10-CM | POA: Diagnosis not present

## 2016-07-07 MED ORDER — RAMIPRIL 10 MG PO CAPS: 10.0000 mg | ORAL_CAPSULE | Freq: Every day | ORAL | 1 refills | Status: DC

## 2016-07-07 NOTE — Progress Notes (Signed)
   Subjective:    Patient ID: Jason Spence, male    DOB: 1940-11-26, 75 y.o.   MRN: [id number]  Hypertension  This is a new problem. The current episode started in the past 7 days. The problem is unchanged. Pertinent negatives include no chest pain, headaches or shortness of breath. There are no associated agents to hypertension. There are no known risk factors for coronary artery disease. Treatments tried: amlodipine. The current treatment provides moderate improvement. There are no compliance problems.    Patient has dizziness and headache. This has been present for awhile.  Patient has a pain in his groin area from time to time. He would like to talk to the doctor about this also.    Review of Systems  Constitutional: Negative for activity change, fatigue and fever.  Respiratory: Negative for cough and shortness of breath.   Cardiovascular: Negative for chest pain and leg swelling.  Neurological: Negative for headaches.       Objective:   Physical Exam  Constitutional: He appears well-nourished. No distress.  Cardiovascular: Normal rate, regular rhythm and normal heart sounds.   No murmur heard. Pulmonary/Chest: Effort normal and breath sounds normal. No respiratory distress.  Musculoskeletal: He exhibits no edema.  Lymphadenopathy:    He has no cervical adenopathy.  Neurological: He is alert.  Psychiatric: His behavior is normal.  Vitals reviewed.  The results of lab work reviewed with the patient kidney function look good sodium-potassium look good glucose looks good PSA good, triglycerides elevated more than likely related to increase starches in the diet continue current medication liver function looks good  Patient finding himself feeling down and sad but at times feeling good about things such as going to church going to grief counseling in hanging out with family patient not suicidal not homicidal     Assessment & Plan:  HTN good control currently stick with  current medications ramipril 10 mg 1 only per day continue everything else as is. Recent lab work looks good.  Patient did have hyperkalemia we adjusted his ACE inhibitor to reduce it to one a day currently his potassium looks good  Hyperlipidemia patient was talked to at length the importance of healthy eating regular physical activity continue medication  Mild depression related to the loss of his wife he is going to grief counseling. He will keep this up. I recommend the patient to follow-up in 6 weeks is still having significant depression issues start medication

## 2016-07-12 ENCOUNTER — Ambulatory Visit: Payer: Medicare HMO | Admitting: Family Medicine

## 2016-08-04 ENCOUNTER — Encounter: Payer: Self-pay | Admitting: Family Medicine

## 2016-08-04 ENCOUNTER — Ambulatory Visit (INDEPENDENT_AMBULATORY_CARE_PROVIDER_SITE_OTHER): Payer: Medicare HMO | Admitting: Family Medicine

## 2016-08-04 VITALS — BP 120/80 | Temp 98.3°F | Ht 64.0 in | Wt 140.1 lb

## 2016-08-04 DIAGNOSIS — T63301A Toxic effect of unspecified spider venom, accidental (unintentional), initial encounter: Secondary | ICD-10-CM | POA: Diagnosis not present

## 2016-08-04 NOTE — Progress Notes (Signed)
   Subjective:    Patient ID: Jason Spence, male    DOB: [birth year], 75 y.o.   MRN: [id number]  HPI Patient is here today for a spider bite on his middle finger on the right hand. Happened today. No pain noted.  Patient has no other concerns at this time.   Stung for a little while   Now slightly uncomfortable and sensitive.  Patient captured to the spider. In fact he brought it in today. He is concerned it may be point   Review of Systems No headache, no major weight loss or weight gain, no chest pain no back pain abdominal pain no change in bowel habits complete ROS otherwise negative     Objective:   Physical Exam   Alert vitals stable, NAD. Blood pressure good on repeat. HEENT normal. Lungs clear. Heart regular rate and rhythm. Tip of involved finger tiny puncture wound no rash no swelling     Assessment & Plan:  Impression spider bite. Nonveteran this. Expect some local irritation symptomatic care discussed.

## 2016-08-18 ENCOUNTER — Ambulatory Visit (INDEPENDENT_AMBULATORY_CARE_PROVIDER_SITE_OTHER): Payer: Medicare HMO | Admitting: Family Medicine

## 2016-08-18 ENCOUNTER — Encounter: Payer: Self-pay | Admitting: Family Medicine

## 2016-08-18 VITALS — BP 140/86 | Ht 64.0 in | Wt 140.6 lb

## 2016-08-18 DIAGNOSIS — I10 Essential (primary) hypertension: Secondary | ICD-10-CM

## 2016-08-18 DIAGNOSIS — G4700 Insomnia, unspecified: Secondary | ICD-10-CM | POA: Diagnosis not present

## 2016-08-18 MED ORDER — TRAZODONE HCL 50 MG PO TABS: 25.0000 mg | ORAL_TABLET | Freq: Every evening | ORAL | 3 refills | Status: DC | PRN

## 2016-08-18 NOTE — Progress Notes (Signed)
   Subjective:    Patient ID: LUCILLE RISDEN, male    DOB: [birth year], 75 y.o.   MRN: [id number]  HPI Patient arrives to follow up on blood pressure and depression related to the death of his wife. Patient relates taken his medicine on a regular basis watching diet staying somewhat active but not doing a lot of walking.  Relates still feeling down and sad but denies any excessive depression he feels that he is coping the way he thinks a person should Patient does relate moderate insomnia has hard time falling asleep pain asleep. Review of Systems Denies any other particular troubles currently no chest tightness pressure pain shortness breath    Objective:   Physical Exam Lungs clear heart regular pulse normal BP good       Assessment & Plan:  Mild depression related to losing his wife-I feel he is coping as expected for such a loss Insomnia Hypertension  Continue current blood pressure medicine under good control Trazodone to help with sleep Trazodone may also help with depression patient does not feel he needs any additional counseling he is not suicidal recheck in 4-5 weeks

## 2016-08-31 DIAGNOSIS — R69 Illness, unspecified: Secondary | ICD-10-CM | POA: Diagnosis not present

## 2016-09-16 ENCOUNTER — Encounter: Payer: Self-pay | Admitting: Family Medicine

## 2016-09-16 ENCOUNTER — Ambulatory Visit (INDEPENDENT_AMBULATORY_CARE_PROVIDER_SITE_OTHER): Payer: Medicare HMO | Admitting: Family Medicine

## 2016-09-16 VITALS — BP 132/70 | Temp 98.7°F | Ht 68.0 in | Wt 139.0 lb

## 2016-09-16 DIAGNOSIS — B349 Viral infection, unspecified: Secondary | ICD-10-CM | POA: Diagnosis not present

## 2016-09-16 DIAGNOSIS — J019 Acute sinusitis, unspecified: Secondary | ICD-10-CM | POA: Diagnosis not present

## 2016-09-16 DIAGNOSIS — I10 Essential (primary) hypertension: Secondary | ICD-10-CM | POA: Diagnosis not present

## 2016-09-16 DIAGNOSIS — E784 Other hyperlipidemia: Secondary | ICD-10-CM

## 2016-09-16 DIAGNOSIS — E7849 Other hyperlipidemia: Secondary | ICD-10-CM

## 2016-09-16 DIAGNOSIS — G4700 Insomnia, unspecified: Secondary | ICD-10-CM

## 2016-09-16 DIAGNOSIS — J209 Acute bronchitis, unspecified: Secondary | ICD-10-CM | POA: Diagnosis not present

## 2016-09-16 MED ORDER — AZITHROMYCIN 250 MG PO TABS: ORAL_TABLET | ORAL | 0 refills | Status: DC

## 2016-09-16 NOTE — Patient Instructions (Signed)
Watch for high fevers   use Zpack   Rest the next few days     If high fevers / short of breath/ or worse then you need to get rechecked

## 2016-09-16 NOTE — Progress Notes (Signed)
   Subjective:    Patient ID: Jason Spence, male    DOB: 11-May-1941, 75 y.o.   MRN: [id number]  Depression         Associated symptoms include no fatigue and no headaches. Follow up on trazadone. Took for 3 days then read side effects and decided to stop. Still wakes during the night.   Dry cough. Started 3 days ago. Taking suphedrine pe. Patient denies any wheezing difficulty breathing denies high fever chills sweats does relate some slight head congestion  Patient doing a good job taking cholesterol medicine previous labs reviewed patient taking his blood pressure medicines watching diet  Review of Systems  Constitutional: Negative for activity change, fatigue and fever.  Respiratory: Negative for cough and shortness of breath.   Cardiovascular: Negative for chest pain and leg swelling.  Neurological: Negative for headaches.  Psychiatric/Behavioral: Positive for depression.       Objective:   Physical Exam  Constitutional: He appears well-nourished.  Cardiovascular: Normal rate, regular rhythm and normal heart sounds.   No murmur heard. Pulmonary/Chest: Effort normal and breath sounds normal.  Musculoskeletal: He exhibits no edema.  Lymphadenopathy:    He has no cervical adenopathy.  Neurological: He is alert.  Psychiatric: His behavior is normal.  Vitals reviewed.         Assessment & Plan:  Insomnia-patient is doing behavioral techniques to manage it. No longer taking trazodone he was concerned about side effects  He denies depression is set for the normal grief  Blood pressure under good control  Patient will follow-up in approximate 6 months.  Patient does have viral illness secondary bronchitis even the possibility of early pneumonia see exam above. Zithromax prescribed. In addition to this I don't recommend x-rays or lab work but I told the patient if he starts having shortness of breath high fevers getting worse he needs rechecked here or ER immediately

## 2016-09-20 ENCOUNTER — Other Ambulatory Visit: Payer: Self-pay | Admitting: Family Medicine

## 2016-09-20 ENCOUNTER — Telehealth: Payer: Self-pay | Admitting: Family Medicine

## 2016-09-20 MED ORDER — AMLODIPINE BESYLATE 5 MG PO TABS: 5.0000 mg | ORAL_TABLET | Freq: Every day | ORAL | 1 refills | Status: DC

## 2016-09-20 MED ORDER — PRAVASTATIN SODIUM 40 MG PO TABS: 40.0000 mg | ORAL_TABLET | Freq: Every day | ORAL | 1 refills | Status: DC

## 2016-09-20 NOTE — Telephone Encounter (Signed)
Notified patient refills sent to pharmacy.

## 2016-09-20 NOTE — Telephone Encounter (Signed)
Pt is needing refills on his  amLODipine (NORVASC) 5 MG tablet  pravastatin (PRAVACHOL) 40 MG tablet  [facility]

## 2016-10-13 ENCOUNTER — Other Ambulatory Visit: Payer: Self-pay | Admitting: Family Medicine

## 2016-10-14 ENCOUNTER — Other Ambulatory Visit: Payer: Self-pay | Admitting: *Deleted

## 2016-10-14 MED ORDER — AMLODIPINE BESYLATE 5 MG PO TABS: 5.0000 mg | ORAL_TABLET | Freq: Every day | ORAL | 0 refills | Status: DC

## 2016-10-19 ENCOUNTER — Other Ambulatory Visit: Payer: Self-pay | Admitting: Family Medicine

## 2016-10-27 DIAGNOSIS — H2513 Age-related nuclear cataract, bilateral: Secondary | ICD-10-CM | POA: Diagnosis not present

## 2016-10-27 DIAGNOSIS — H35422 Microcystoid degeneration of retina, left eye: Secondary | ICD-10-CM | POA: Diagnosis not present

## 2016-10-27 DIAGNOSIS — H353133 Nonexudative age-related macular degeneration, bilateral, advanced atrophic without subfoveal involvement: Secondary | ICD-10-CM | POA: Diagnosis not present

## 2016-11-01 ENCOUNTER — Telehealth: Payer: Self-pay | Admitting: Family Medicine

## 2016-11-01 MED ORDER — HYDROCHLOROTHIAZIDE 12.5 MG PO CAPS: ORAL_CAPSULE | ORAL | 1 refills | Status: DC

## 2016-11-01 NOTE — Telephone Encounter (Signed)
Pt is needing refills on his hydrochlorothiazide (MICROZIDE) 12.5 MG capsule Pt is needing 90 day supplies.     [facility]

## 2016-11-01 NOTE — Telephone Encounter (Signed)
Notified patient refill sent to pharmacy.  

## 2016-12-06 DIAGNOSIS — R69 Illness, unspecified: Secondary | ICD-10-CM | POA: Diagnosis not present

## 2016-12-28 DIAGNOSIS — H353130 Nonexudative age-related macular degeneration, bilateral, stage unspecified: Secondary | ICD-10-CM | POA: Diagnosis not present

## 2016-12-28 DIAGNOSIS — H02839 Dermatochalasis of unspecified eye, unspecified eyelid: Secondary | ICD-10-CM | POA: Diagnosis not present

## 2016-12-28 DIAGNOSIS — H18413 Arcus senilis, bilateral: Secondary | ICD-10-CM | POA: Diagnosis not present

## 2016-12-28 DIAGNOSIS — H2513 Age-related nuclear cataract, bilateral: Secondary | ICD-10-CM | POA: Diagnosis not present

## 2016-12-28 DIAGNOSIS — H2511 Age-related nuclear cataract, right eye: Secondary | ICD-10-CM | POA: Diagnosis not present

## 2016-12-30 ENCOUNTER — Telehealth: Payer: Self-pay | Admitting: Family Medicine

## 2016-12-30 NOTE — Telephone Encounter (Signed)
A request was received from a Dr. Talbert Forest for the patient have surgical clearance for cataract. There is no contraindications to his surgery. This form was completed please forward it thank you please forward a copy of the medicine list as well as finish out the form in regards to our office information thank you

## 2017-01-03 DIAGNOSIS — I10 Essential (primary) hypertension: Secondary | ICD-10-CM | POA: Diagnosis not present

## 2017-01-03 DIAGNOSIS — Z87891 Personal history of nicotine dependence: Secondary | ICD-10-CM | POA: Diagnosis not present

## 2017-01-03 DIAGNOSIS — Z6821 Body mass index (BMI) 21.0-21.9, adult: Secondary | ICD-10-CM | POA: Diagnosis not present

## 2017-01-03 DIAGNOSIS — H3530 Unspecified macular degeneration: Secondary | ICD-10-CM | POA: Diagnosis not present

## 2017-01-03 DIAGNOSIS — Z7982 Long term (current) use of aspirin: Secondary | ICD-10-CM | POA: Diagnosis not present

## 2017-01-03 DIAGNOSIS — Z0000 Encounter for general adult medical examination without abnormal findings: Secondary | ICD-10-CM | POA: Diagnosis not present

## 2017-01-03 DIAGNOSIS — Z9181 History of falling: Secondary | ICD-10-CM | POA: Diagnosis not present

## 2017-01-03 DIAGNOSIS — K219 Gastro-esophageal reflux disease without esophagitis: Secondary | ICD-10-CM | POA: Diagnosis not present

## 2017-01-03 DIAGNOSIS — E782 Mixed hyperlipidemia: Secondary | ICD-10-CM | POA: Diagnosis not present

## 2017-01-03 DIAGNOSIS — Z79899 Other long term (current) drug therapy: Secondary | ICD-10-CM | POA: Diagnosis not present

## 2017-01-06 ENCOUNTER — Telehealth: Payer: Self-pay | Admitting: Family Medicine

## 2017-01-06 MED ORDER — AMLODIPINE BESYLATE 5 MG PO TABS: 5.0000 mg | ORAL_TABLET | Freq: Every day | ORAL | 0 refills | Status: DC

## 2017-01-06 MED ORDER — PRAVASTATIN SODIUM 40 MG PO TABS: ORAL_TABLET | ORAL | 0 refills | Status: DC

## 2017-01-06 MED ORDER — HYDROCHLOROTHIAZIDE 12.5 MG PO CAPS: ORAL_CAPSULE | ORAL | 0 refills | Status: DC

## 2017-01-06 MED ORDER — RAMIPRIL 10 MG PO CAPS: 10.0000 mg | ORAL_CAPSULE | Freq: Every day | ORAL | 0 refills | Status: DC

## 2017-01-06 NOTE — Telephone Encounter (Signed)
Patient needs Rx for amlodipine 5 mg tab, hydrochlorothiazide 12.5 mg caps, pravastatin 40 mg tab, ramipril 5 mg caps for 90 day supply.   [facility] 

## 2017-01-06 NOTE — Telephone Encounter (Signed)
Spoke with patient and informed him that 90 day supplies were sent in on requested medications. Patient verbalized understanding.

## 2017-01-24 ENCOUNTER — Telehealth: Payer: Self-pay | Admitting: Family Medicine

## 2017-01-24 DIAGNOSIS — Z79899 Other long term (current) drug therapy: Secondary | ICD-10-CM

## 2017-01-24 DIAGNOSIS — E7849 Other hyperlipidemia: Secondary | ICD-10-CM

## 2017-01-24 DIAGNOSIS — I10 Essential (primary) hypertension: Secondary | ICD-10-CM

## 2017-01-24 NOTE — Telephone Encounter (Signed)
Patient had Lipid, Liver. Met 7 and PSA done 06/2016 

## 2017-01-24 NOTE — Telephone Encounter (Signed)
Patient is requesting order for labs for appointment on 02/02/17 with Dr. Nicki Reaper.

## 2017-01-24 NOTE — Telephone Encounter (Signed)
Blood work ordered in EPIC. Patient notified. 

## 2017-01-24 NOTE — Telephone Encounter (Signed)
Lipid, liver, metabolic 7 

## 2017-01-26 DIAGNOSIS — Z79899 Other long term (current) drug therapy: Secondary | ICD-10-CM | POA: Diagnosis not present

## 2017-01-26 DIAGNOSIS — E784 Other hyperlipidemia: Secondary | ICD-10-CM | POA: Diagnosis not present

## 2017-01-26 DIAGNOSIS — I10 Essential (primary) hypertension: Secondary | ICD-10-CM | POA: Diagnosis not present

## 2017-01-27 DIAGNOSIS — R69 Illness, unspecified: Secondary | ICD-10-CM | POA: Diagnosis not present

## 2017-01-27 LAB — HEPATIC FUNCTION PANEL
ALT: 21 IU/L (ref 0–44)
AST: 26 IU/L (ref 0–40)
Albumin: 4.6 g/dL (ref 3.5–4.8)
Alkaline Phosphatase: 67 IU/L (ref 39–117)
BILIRUBIN, DIRECT: 0.13 mg/dL (ref 0.00–0.40)
Bilirubin Total: 0.5 mg/dL (ref 0.0–1.2)
TOTAL PROTEIN: 7.5 g/dL (ref 6.0–8.5)

## 2017-01-27 LAB — BASIC METABOLIC PANEL
BUN / CREAT RATIO: 13 (ref 10–24)
BUN: 11 mg/dL (ref 8–27)
CALCIUM: 10 mg/dL (ref 8.6–10.2)
CO2: 30 mmol/L — ABNORMAL HIGH (ref 18–29)
Chloride: 97 mmol/L (ref 96–106)
Creatinine, Ser: 0.85 mg/dL (ref 0.76–1.27)
GFR, EST AFRICAN AMERICAN: 97 mL/min/{1.73_m2} (ref >59)
GFR, EST NON AFRICAN AMERICAN: 84 mL/min/{1.73_m2} (ref >59)
GLUCOSE: 97 mg/dL (ref 65–99)
Potassium: 4.7 mmol/L (ref 3.5–5.2)
Sodium: 141 mmol/L (ref 134–144)

## 2017-01-27 LAB — LIPID PANEL
CHOL/HDL RATIO: 3.8 ratio (ref 0.0–5.0)
Cholesterol, Total: 154 mg/dL (ref 100–199)
HDL: 41 mg/dL (ref >39)
LDL Calculated: 80 mg/dL (ref 0–99)
Triglycerides: 167 mg/dL — ABNORMAL HIGH (ref 0–149)
VLDL Cholesterol Cal: 33 mg/dL (ref 5–40)

## 2017-02-02 ENCOUNTER — Ambulatory Visit (INDEPENDENT_AMBULATORY_CARE_PROVIDER_SITE_OTHER): Payer: Medicare HMO | Admitting: Family Medicine

## 2017-02-02 ENCOUNTER — Encounter: Payer: Self-pay | Admitting: Family Medicine

## 2017-02-02 VITALS — BP 130/68 | Temp 97.7°F | Ht 68.0 in | Wt 137.4 lb

## 2017-02-02 DIAGNOSIS — J301 Allergic rhinitis due to pollen: Secondary | ICD-10-CM

## 2017-02-02 DIAGNOSIS — I951 Orthostatic hypotension: Secondary | ICD-10-CM

## 2017-02-02 DIAGNOSIS — F324 Major depressive disorder, single episode, in partial remission: Secondary | ICD-10-CM | POA: Diagnosis not present

## 2017-02-02 DIAGNOSIS — E784 Other hyperlipidemia: Secondary | ICD-10-CM

## 2017-02-02 DIAGNOSIS — R69 Illness, unspecified: Secondary | ICD-10-CM | POA: Diagnosis not present

## 2017-02-02 DIAGNOSIS — I10 Essential (primary) hypertension: Secondary | ICD-10-CM | POA: Diagnosis not present

## 2017-02-02 DIAGNOSIS — E7849 Other hyperlipidemia: Secondary | ICD-10-CM

## 2017-02-02 MED ORDER — RAMIPRIL 10 MG PO CAPS: 10.0000 mg | ORAL_CAPSULE | Freq: Every day | ORAL | 2 refills | Status: DC

## 2017-02-02 MED ORDER — PRAVASTATIN SODIUM 40 MG PO TABS: ORAL_TABLET | ORAL | 1 refills | Status: DC

## 2017-02-02 MED ORDER — AMLODIPINE BESYLATE 2.5 MG PO TABS: 2.5000 mg | ORAL_TABLET | Freq: Every day | ORAL | 2 refills | Status: DC

## 2017-02-02 MED ORDER — FLUTICASONE PROPIONATE 50 MCG/ACT NA SUSP: 2.0000 | Freq: Every day | NASAL | 6 refills | Status: DC

## 2017-02-02 MED ORDER — HYDROCHLOROTHIAZIDE 12.5 MG PO CAPS: ORAL_CAPSULE | ORAL | 1 refills | Status: DC

## 2017-02-02 NOTE — Progress Notes (Signed)
   Subjective:    Patient ID: Jason Spence, male    DOB: 08/29/1941, 76 y.o.   MRN: [id number]  Depression       The problem is unchanged.  Associated symptoms include sad.  Associated symptoms include no fatigue and no appetite change.( Feeling down)  Treatments tried: pt voluntary not taking medication.  Previous treatment provided no relief relief. Hypertension  Pertinent negatives include no chest pain.  Patient does take his 81 mg aspirin to help prevent heart attacks Takes his blood pressure medicine states occasionally gets a little woozy when he stands up but denies any chest tightness pressure pain or shortness of breath Does uses allergy medicine when necessary Patient with a respiratory does take his cholesterol medicine on a regular basis tries watch diet as best he can    Review of Systems  Constitutional: Negative for activity change, appetite change and fatigue.  HENT: Negative for congestion.   Respiratory: Negative for cough.   Cardiovascular: Negative for chest pain.  Gastrointestinal: Negative for abdominal pain.  Endocrine: Negative for polydipsia and polyphagia.  Neurological: Negative for weakness.  Psychiatric/Behavioral: Positive for depression. Negative for confusion.       Objective:   Physical Exam  Constitutional: He appears well-nourished. No distress.  Cardiovascular: Normal rate, regular rhythm and normal heart sounds.   No murmur heard. Pulmonary/Chest: Effort normal and breath sounds normal. No respiratory distress.  Musculoskeletal: He exhibits no edema.  Lymphadenopathy:    He has no cervical adenopathy.  Neurological: He is alert.  Psychiatric: His behavior is normal.  Vitals reviewed.   Patient relates moods are sometimes depressed but other times he is feeling better at times he does feel lonely.      Assessment & Plan:  Hyperlipidemia previous labs reviewed continue medication watch diet HTN very good control but we will be  reducing amlodipine new dose 2.5 mg. Patient having some low blood pressures on today's examination Orthostatic hypotension significant orthostatic hypotension see above reducing amlodipine Allergic rhinitis allergy medicines discuss in detail Depression Doing much better still grieving over the loss of his wife but doing better compared how he was continue medication for now Follow-up 6 months major depression partial remission

## 2017-02-02 NOTE — Patient Instructions (Signed)
Reduce the amlodipine from 5 mg to 1/2 of that- new dose 2.5 mg once daily  recheck

## 2017-02-14 DIAGNOSIS — H2512 Age-related nuclear cataract, left eye: Secondary | ICD-10-CM | POA: Diagnosis not present

## 2017-02-14 DIAGNOSIS — H2511 Age-related nuclear cataract, right eye: Secondary | ICD-10-CM | POA: Diagnosis not present

## 2017-02-15 DIAGNOSIS — H2512 Age-related nuclear cataract, left eye: Secondary | ICD-10-CM | POA: Diagnosis not present

## 2017-02-17 ENCOUNTER — Ambulatory Visit: Payer: Medicare HMO

## 2017-02-17 VITALS — BP 124/60

## 2017-02-17 DIAGNOSIS — Z0130 Encounter for examination of blood pressure without abnormal findings: Secondary | ICD-10-CM

## 2017-02-17 NOTE — Progress Notes (Signed)
Patient in today for a BP check. When patient in last time BP was low upon standing. Today Blood pressure sitting was 122/68 and standing 124/60. Discussed blood pressure reading with Dr.[name] [name] and was told to have patient continue taking Amlodipine 2.5 Mg and follow up in October. Patient verbalized understanding.

## 2017-02-21 DIAGNOSIS — H25811 Combined forms of age-related cataract, right eye: Secondary | ICD-10-CM | POA: Diagnosis not present

## 2017-02-28 DIAGNOSIS — H2512 Age-related nuclear cataract, left eye: Secondary | ICD-10-CM | POA: Diagnosis not present

## 2017-03-09 DIAGNOSIS — H25812 Combined forms of age-related cataract, left eye: Secondary | ICD-10-CM | POA: Diagnosis not present

## 2017-03-29 ENCOUNTER — Encounter: Payer: Self-pay | Admitting: Family Medicine

## 2017-03-29 ENCOUNTER — Ambulatory Visit (INDEPENDENT_AMBULATORY_CARE_PROVIDER_SITE_OTHER): Payer: Medicare HMO | Admitting: Family Medicine

## 2017-03-29 ENCOUNTER — Ambulatory Visit (HOSPITAL_COMMUNITY)
Admission: RE | Admit: 2017-03-29 | Discharge: 2017-03-29 | Disposition: A | Discharge status: Home / Self Care | Payer: Medicare HMO | Source: Ambulatory Visit | Attending: Family Medicine | Admitting: Family Medicine

## 2017-03-29 VITALS — BP 132/86 | Ht 68.0 in | Wt 136.2 lb

## 2017-03-29 DIAGNOSIS — I700 Atherosclerosis of aorta: Secondary | ICD-10-CM | POA: Diagnosis not present

## 2017-03-29 DIAGNOSIS — M545 Low back pain, unspecified: Secondary | ICD-10-CM

## 2017-03-29 DIAGNOSIS — M5136 Other intervertebral disc degeneration, lumbar region: Secondary | ICD-10-CM | POA: Insufficient documentation

## 2017-03-29 DIAGNOSIS — M8938 Hypertrophy of bone, other site: Secondary | ICD-10-CM | POA: Diagnosis not present

## 2017-03-29 LAB — POCT URINALYSIS DIPSTICK
Blood, UA: NEGATIVE
Spec Grav, UA: 1.01 (ref 1.010–1.025)
pH, UA: 7 (ref 5.0–8.0)

## 2017-03-29 MED ORDER — ETODOLAC 400 MG PO TABS: 400.0000 mg | ORAL_TABLET | Freq: Two times a day (BID) | ORAL | 0 refills | Status: DC

## 2017-03-29 MED ORDER — TRAMADOL HCL 50 MG PO TABS: 50.0000 mg | ORAL_TABLET | Freq: Four times a day (QID) | ORAL | 0 refills | Status: DC | PRN

## 2017-03-29 NOTE — Progress Notes (Signed)
   Subjective:    Patient ID: Jason Spence, male    DOB: 03-16-1941, 76 y.o.   MRN: [id number]  Back Pain  This is a new problem. The current episode started in the past 7 days. The pain is present in the lumbar spine. The symptoms are aggravated by bending. Associated symptoms include abdominal pain. Pertinent negatives include no fever. He has tried NSAIDs (Ibuprofen ) for the symptoms. The treatment provided no relief.  He relates several days low back pain hurts with movement hurts when he tries to stand up denies injuring it denies any falls, denies high fevers denies night sweats denies weight loss Patient states no other concerns this visit.  Results for orders placed or performed in visit on 03/29/17  POCT urinalysis dipstick  Result Value Ref Range   Color, UA Yellow    Clarity, UA Clear    Glucose, UA     Bilirubin, UA     Ketones, UA     Spec Grav, UA 1.010 1.010 - 1.025   Blood, UA Negative    pH, UA 7.0 5.0 - 8.0   Protein, UA     Urobilinogen, UA  0.2 or 1.0 E.U./dL   Nitrite, UA     Leukocytes, UA Trace (A) Negative      Review of Systems  Constitutional: Negative for fatigue and fever.  HENT: Negative for congestion.   Gastrointestinal: Positive for abdominal pain. Negative for diarrhea, nausea and vomiting.  Musculoskeletal: Positive for back pain. Negative for arthralgias.       Objective:   Physical Exam  Constitutional: He appears well-nourished. No distress.  Cardiovascular: Normal rate, regular rhythm and normal heart sounds.   No murmur heard. Pulmonary/Chest: Effort normal and breath sounds normal. No respiratory distress.  Abdominal: Soft. He exhibits no distension. There is no tenderness.  Musculoskeletal: He exhibits no edema.  Lymphadenopathy:    He has no cervical adenopathy.  Neurological: He is alert.  Psychiatric: His behavior is normal.  Vitals reviewed.         Assessment & Plan:  Significant lumbar pain concerning for the  possibility of underlying problems possibly even a compression fracture it could certainly be musculoskeletal anti-inflammatory over the course of the next 10-14 days all tramadol nighttime as as necessary for severe pain recommend lab work recommended x-rays await the results no need for MRI currently if progressive troubles or worse follow-up

## 2017-03-30 LAB — CBC WITH DIFFERENTIAL/PLATELET
Basophils Absolute: 0.1 10*3/uL (ref 0.0–0.2)
Basos: 1 %
EOS (ABSOLUTE): 0.8 10*3/uL — AB (ref 0.0–0.4)
Eos: 8 %
HEMOGLOBIN: 14.6 g/dL (ref 13.0–17.7)
Hematocrit: 43.8 % (ref 37.5–51.0)
Immature Grans (Abs): 0 10*3/uL (ref 0.0–0.1)
Immature Granulocytes: 0 %
LYMPHS ABS: 1.7 10*3/uL (ref 0.7–3.1)
Lymphs: 17 %
MCH: 29 pg (ref 26.6–33.0)
MCHC: 33.3 g/dL (ref 31.5–35.7)
MCV: 87 fL (ref 79–97)
MONOCYTES: 7 %
MONOS ABS: 0.7 10*3/uL (ref 0.1–0.9)
NEUTROS ABS: 6.7 10*3/uL (ref 1.4–7.0)
Neutrophils: 67 %
Platelets: 253 10*3/uL (ref 150–379)
RBC: 5.04 x10E6/uL (ref 4.14–5.80)
RDW: 14.3 % (ref 12.3–15.4)
WBC: 10 10*3/uL (ref 3.4–10.8)

## 2017-03-30 LAB — PSA: Prostate Specific Ag, Serum: 4.6 ng/mL — ABNORMAL HIGH (ref 0.0–4.0)

## 2017-04-01 ENCOUNTER — Other Ambulatory Visit: Payer: Self-pay | Admitting: *Deleted

## 2017-04-01 DIAGNOSIS — Z961 Presence of intraocular lens: Secondary | ICD-10-CM | POA: Diagnosis not present

## 2017-04-01 DIAGNOSIS — H52223 Regular astigmatism, bilateral: Secondary | ICD-10-CM | POA: Diagnosis not present

## 2017-04-01 MED ORDER — CIPROFLOXACIN HCL 500 MG PO TABS: 500.0000 mg | ORAL_TABLET | Freq: Two times a day (BID) | ORAL | 0 refills | Status: DC

## 2017-04-11 ENCOUNTER — Telehealth: Payer: Self-pay | Admitting: Family Medicine

## 2017-04-11 DIAGNOSIS — R972 Elevated prostate specific antigen [PSA]: Secondary | ICD-10-CM

## 2017-04-11 NOTE — Telephone Encounter (Signed)
Pt is aware, he will mark calendar for 4 weeks after finishing antibx. The orders for the repeat PSA has been sent to [facility]

## 2017-04-11 NOTE — Telephone Encounter (Signed)
Patient had PSA on 03/29/17 was told to repeat PSA after completing antibiotics. Would you like me to order PSA again?

## 2017-04-11 NOTE — Telephone Encounter (Signed)
Please let the patient know that his follow-up visit this Friday his follow-up more in regards to doing a recheck. As for the lab work we will wait till 4 weeks after he is finished the antibiotics to repeat the PSA. The PSA may be ordered but he is not to get it drawn until 4 weeks after completing the antibiotics

## 2017-04-11 NOTE — Telephone Encounter (Signed)
Is coming in this Friday for a f/u. His PSA was elevated last time and he was told he would need bloodwork. He isn't sure if he is suppose to have it prior to his appt or wait until afterwards.  No lab orders in the system. Please call patient and let him know if he needs to go to [facility] this week.

## 2017-04-15 ENCOUNTER — Encounter: Payer: Self-pay | Admitting: Family Medicine

## 2017-04-15 ENCOUNTER — Ambulatory Visit (INDEPENDENT_AMBULATORY_CARE_PROVIDER_SITE_OTHER): Payer: Medicare HMO | Admitting: Family Medicine

## 2017-04-15 VITALS — BP 128/70 | Temp 98.9°F | Ht 68.0 in | Wt 137.0 lb

## 2017-04-15 DIAGNOSIS — R972 Elevated prostate specific antigen [PSA]: Secondary | ICD-10-CM | POA: Diagnosis not present

## 2017-04-15 MED ORDER — TRIAMCINOLONE ACETONIDE 0.1 % EX CREA: TOPICAL_CREAM | CUTANEOUS | 0 refills | Status: DC

## 2017-04-15 NOTE — Progress Notes (Signed)
Subjective:     Patient ID: Jason Spence, male   DOB: 11-30-1940, 76 y.o.   MRN: [id number]  HPIFollow up elevated PSA. Taking cipro for possible prostatitis. Was low back pain. Pt states pain is much better.  Patient relates anti-inflammatory tramadol caused stomach cramps he stopped taking this. He states he feels somewhat better now. Denies high fever chills sweats. Denies hematuria. Appetite okay.  Review of Systems See above.    Objective:   Physical Exam Lungs clear heart regular pulse normal BP good extremities no edema    Assessment:     Prostatitis resolving   chance of this being cancer is  low but needs to be monitored closely Plan:     Prostatitis resolving Elevated PSA Recheck PSA 6 weeks after the antibiotics the week of August 25 If progressive troubles or worse follow-up sooner Keep appointment in the fall

## 2017-04-25 DIAGNOSIS — R69 Illness, unspecified: Secondary | ICD-10-CM | POA: Diagnosis not present

## 2017-05-26 DIAGNOSIS — R69 Illness, unspecified: Secondary | ICD-10-CM | POA: Diagnosis not present

## 2017-06-14 DIAGNOSIS — R972 Elevated prostate specific antigen [PSA]: Secondary | ICD-10-CM | POA: Diagnosis not present

## 2017-06-15 LAB — PSA: PROSTATE SPECIFIC AG, SERUM: 3.3 ng/mL (ref 0.0–4.0)

## 2017-07-28 ENCOUNTER — Other Ambulatory Visit: Payer: Self-pay | Admitting: Family Medicine

## 2017-07-28 ENCOUNTER — Telehealth: Payer: Self-pay | Admitting: Family Medicine

## 2017-07-28 DIAGNOSIS — E785 Hyperlipidemia, unspecified: Secondary | ICD-10-CM

## 2017-07-28 DIAGNOSIS — Z79899 Other long term (current) drug therapy: Secondary | ICD-10-CM

## 2017-07-28 NOTE — Telephone Encounter (Signed)
Lipid, liver, metabolic 7 

## 2017-07-28 NOTE — Telephone Encounter (Signed)
Pt is requesting lab orders to be sent over for an upcoming appt on the 17th. Last labs per epic were: psa on 06/14/17.

## 2017-07-28 NOTE — Telephone Encounter (Signed)
Orders for bloodwork put in. Pt notified.

## 2017-07-28 NOTE — Addendum Note (Signed)
Addended by: Carmelina Noun on: 07/28/2017 11:42 AM   Modules accepted: Orders

## 2017-07-29 DIAGNOSIS — Z79899 Other long term (current) drug therapy: Secondary | ICD-10-CM | POA: Diagnosis not present

## 2017-07-29 DIAGNOSIS — E785 Hyperlipidemia, unspecified: Secondary | ICD-10-CM | POA: Diagnosis not present

## 2017-07-31 LAB — BASIC METABOLIC PANEL
BUN/Creatinine Ratio: 12 (ref 10–24)
BUN: 9 mg/dL (ref 8–27)
CALCIUM: 9.7 mg/dL (ref 8.6–10.2)
CHLORIDE: 100 mmol/L (ref 96–106)
CO2: 26 mmol/L (ref 20–29)
Creatinine, Ser: 0.75 mg/dL — ABNORMAL LOW (ref 0.76–1.27)
GFR, EST AFRICAN AMERICAN: 102 mL/min/{1.73_m2} (ref >59)
GFR, EST NON AFRICAN AMERICAN: 88 mL/min/{1.73_m2} (ref >59)
Glucose: 94 mg/dL (ref 65–99)
POTASSIUM: 4.7 mmol/L (ref 3.5–5.2)
SODIUM: 143 mmol/L (ref 134–144)

## 2017-07-31 LAB — LIPID PANEL
CHOLESTEROL TOTAL: 139 mg/dL (ref 100–199)
Chol/HDL Ratio: 3.9 ratio (ref 0.0–5.0)
HDL: 36 mg/dL — AB (ref >39)
LDL CALC: 69 mg/dL (ref 0–99)
Triglycerides: 171 mg/dL — ABNORMAL HIGH (ref 0–149)
VLDL CHOLESTEROL CAL: 34 mg/dL (ref 5–40)

## 2017-07-31 LAB — HEPATIC FUNCTION PANEL
ALBUMIN: 4.4 g/dL (ref 3.5–4.8)
ALK PHOS: 70 IU/L (ref 39–117)
ALT: 19 IU/L (ref 0–44)
AST: 22 IU/L (ref 0–40)
BILIRUBIN TOTAL: 0.5 mg/dL (ref 0.0–1.2)
Bilirubin, Direct: 0.13 mg/dL (ref 0.00–0.40)
TOTAL PROTEIN: 7.7 g/dL (ref 6.0–8.5)

## 2017-08-03 ENCOUNTER — Encounter: Payer: Self-pay | Admitting: Family Medicine

## 2017-08-03 ENCOUNTER — Ambulatory Visit (INDEPENDENT_AMBULATORY_CARE_PROVIDER_SITE_OTHER): Payer: Medicare HMO | Admitting: Family Medicine

## 2017-08-03 VITALS — BP 132/62 | Ht 68.0 in | Wt 139.0 lb

## 2017-08-03 DIAGNOSIS — Z23 Encounter for immunization: Secondary | ICD-10-CM

## 2017-08-03 DIAGNOSIS — Z125 Encounter for screening for malignant neoplasm of prostate: Secondary | ICD-10-CM | POA: Diagnosis not present

## 2017-08-03 DIAGNOSIS — K4091 Unilateral inguinal hernia, without obstruction or gangrene, recurrent: Secondary | ICD-10-CM | POA: Diagnosis not present

## 2017-08-03 DIAGNOSIS — E7849 Other hyperlipidemia: Secondary | ICD-10-CM

## 2017-08-03 DIAGNOSIS — I10 Essential (primary) hypertension: Secondary | ICD-10-CM

## 2017-08-03 MED ORDER — AMLODIPINE BESYLATE 2.5 MG PO TABS: 2.5000 mg | ORAL_TABLET | Freq: Every day | ORAL | 2 refills | Status: DC

## 2017-08-03 MED ORDER — HYDROCHLOROTHIAZIDE 12.5 MG PO CAPS: ORAL_CAPSULE | ORAL | 1 refills | Status: DC

## 2017-08-03 MED ORDER — PRAVASTATIN SODIUM 40 MG PO TABS: ORAL_TABLET | ORAL | 1 refills | Status: DC

## 2017-08-03 MED ORDER — RAMIPRIL 10 MG PO CAPS: 10.0000 mg | ORAL_CAPSULE | Freq: Every day | ORAL | 2 refills | Status: DC

## 2017-08-03 NOTE — Patient Instructions (Signed)
Please do your PSA at the end of Dec/start of Jan  Recheck here in the spring

## 2017-08-03 NOTE — Progress Notes (Signed)
   Subjective:    Patient ID: Jason Spence, male    DOB: 1941-10-02, 76 y.o.   MRN: [id number]  Hypertension  Pertinent negatives include no chest pain.  This patient relates a pleasant groin pain discomfort that comes and goes burning tearing sensation. Denies fever chills sweats.  Also has had elevated PSA which on the last visit came back down but he is interested in rechecking it again he is fearful of getting prostate cancer  He does have hyperlipidemia he takes his medication on a regular basis Patient is here for follow up on HTN.Patient is on Ramipril 10 mg one daily,and Amlodipine 2.5 mg daily. States he eats healthy and exercises some. Sees Dr. Evern Core a dermatologist. Says he is having some lower right groin area pain(tearing Sensation). No urinary problems. Has had several hernia's in the past.  Review of Systems  Constitutional: Negative for activity change, appetite change and fatigue.  HENT: Negative for congestion.   Respiratory: Negative for cough.   Cardiovascular: Negative for chest pain.  Gastrointestinal: Negative for abdominal pain, blood in stool and constipation.  Endocrine: Negative for polydipsia and polyphagia.  Neurological: Negative for weakness.  Psychiatric/Behavioral: Negative for confusion.       Objective:   Physical Exam  Constitutional: He appears well-nourished. No distress.  Cardiovascular: Normal rate, regular rhythm and normal heart sounds.   No murmur heard. Pulmonary/Chest: Effort normal and breath sounds normal. No respiratory distress.  Abdominal: Soft. There is tenderness.  Musculoskeletal: He exhibits no edema.  Lymphadenopathy:    He has no cervical adenopathy.  Neurological: He is alert.  Psychiatric: His behavior is normal.  Vitals reviewed.  On abdominal exam the abdomen soft but he does have a hernia noted in the right groin which is slightly tender it is not incarcerated      25 minutes was spent with the patient.  Greater than half the time was spent in discussion and answering questions and counseling regarding the issues that the patient came in for today.  Assessment & Plan:  Flu shot today Inguinal hernia referral to surgery for further evaluation Hyperlipidemia continue medication under good control Patient with PSA elevation earlier in the year repeat PSA HTN good control continue current measures Reflux under good control not on any medicines

## 2017-08-04 ENCOUNTER — Ambulatory Visit: Payer: Medicare HMO | Admitting: Family Medicine

## 2017-08-04 NOTE — Progress Notes (Signed)
Referral put in for [facility] for Hernia.

## 2017-08-04 NOTE — Addendum Note (Signed)
Addended by: Launa Grill on: 08/04/2017 08:35 AM   Modules accepted: Orders

## 2017-08-09 ENCOUNTER — Encounter: Payer: Self-pay | Admitting: Family Medicine

## 2017-08-11 DIAGNOSIS — R69 Illness, unspecified: Secondary | ICD-10-CM | POA: Diagnosis not present

## 2017-08-26 DIAGNOSIS — R69 Illness, unspecified: Secondary | ICD-10-CM | POA: Diagnosis not present

## 2017-09-01 DIAGNOSIS — K4091 Unilateral inguinal hernia, without obstruction or gangrene, recurrent: Secondary | ICD-10-CM | POA: Diagnosis not present

## 2017-10-03 DIAGNOSIS — K4021 Bilateral inguinal hernia, without obstruction or gangrene, recurrent: Secondary | ICD-10-CM | POA: Diagnosis not present

## 2017-10-26 DIAGNOSIS — Z125 Encounter for screening for malignant neoplasm of prostate: Secondary | ICD-10-CM | POA: Diagnosis not present

## 2017-10-27 ENCOUNTER — Encounter: Payer: Self-pay | Admitting: Family Medicine

## 2017-10-27 LAB — PSA: Prostate Specific Ag, Serum: 3.8 ng/mL (ref 0.0–4.0)

## 2017-11-04 ENCOUNTER — Other Ambulatory Visit: Payer: Self-pay | Admitting: *Deleted

## 2017-11-04 MED ORDER — HYDROCHLOROTHIAZIDE 12.5 MG PO CAPS: ORAL_CAPSULE | ORAL | 0 refills | Status: DC

## 2017-12-08 DIAGNOSIS — Z87891 Personal history of nicotine dependence: Secondary | ICD-10-CM | POA: Diagnosis not present

## 2017-12-08 DIAGNOSIS — I10 Essential (primary) hypertension: Secondary | ICD-10-CM | POA: Diagnosis not present

## 2017-12-08 DIAGNOSIS — Z85828 Personal history of other malignant neoplasm of skin: Secondary | ICD-10-CM | POA: Diagnosis not present

## 2017-12-08 DIAGNOSIS — I252 Old myocardial infarction: Secondary | ICD-10-CM | POA: Diagnosis not present

## 2017-12-08 DIAGNOSIS — E785 Hyperlipidemia, unspecified: Secondary | ICD-10-CM | POA: Diagnosis not present

## 2017-12-08 DIAGNOSIS — Z8249 Family history of ischemic heart disease and other diseases of the circulatory system: Secondary | ICD-10-CM | POA: Diagnosis not present

## 2017-12-08 DIAGNOSIS — I2510 Atherosclerotic heart disease of native coronary artery without angina pectoris: Secondary | ICD-10-CM | POA: Diagnosis not present

## 2017-12-08 DIAGNOSIS — J309 Allergic rhinitis, unspecified: Secondary | ICD-10-CM | POA: Diagnosis not present

## 2018-01-18 ENCOUNTER — Encounter: Payer: Self-pay | Admitting: Family Medicine

## 2018-01-18 DIAGNOSIS — H353133 Nonexudative age-related macular degeneration, bilateral, advanced atrophic without subfoveal involvement: Secondary | ICD-10-CM | POA: Diagnosis not present

## 2018-01-18 DIAGNOSIS — Z961 Presence of intraocular lens: Secondary | ICD-10-CM | POA: Diagnosis not present

## 2018-01-23 ENCOUNTER — Telehealth: Payer: Self-pay | Admitting: Family Medicine

## 2018-01-23 NOTE — Telephone Encounter (Signed)
Patient is wondering if he needs labs for 6 month follow up on 4/16.

## 2018-01-24 ENCOUNTER — Other Ambulatory Visit: Payer: Self-pay | Admitting: Family Medicine

## 2018-01-24 DIAGNOSIS — I10 Essential (primary) hypertension: Secondary | ICD-10-CM

## 2018-01-24 DIAGNOSIS — E785 Hyperlipidemia, unspecified: Secondary | ICD-10-CM

## 2018-01-24 NOTE — Telephone Encounter (Signed)
Pt called to check on this & asked that we please call when orders are ready or to notify that no labs are needed

## 2018-01-24 NOTE — Telephone Encounter (Signed)
Metabolic 7, lipid-hypertensive medications, hyperlipidemia

## 2018-01-24 NOTE — Telephone Encounter (Signed)
Spoke with patient and informed him of lab orders

## 2018-01-25 DIAGNOSIS — E785 Hyperlipidemia, unspecified: Secondary | ICD-10-CM | POA: Diagnosis not present

## 2018-01-25 DIAGNOSIS — I10 Essential (primary) hypertension: Secondary | ICD-10-CM | POA: Diagnosis not present

## 2018-01-26 LAB — BASIC METABOLIC PANEL
BUN / CREAT RATIO: 15 (ref 10–24)
BUN: 12 mg/dL (ref 8–27)
CO2: 26 mmol/L (ref 20–29)
CREATININE: 0.78 mg/dL (ref 0.76–1.27)
Calcium: 9.1 mg/dL (ref 8.6–10.2)
Chloride: 103 mmol/L (ref 96–106)
GFR calc Af Amer: 100 mL/min/{1.73_m2} (ref >59)
GFR calc non Af Amer: 86 mL/min/{1.73_m2} (ref >59)
GLUCOSE: 92 mg/dL (ref 65–99)
Potassium: 4.6 mmol/L (ref 3.5–5.2)
Sodium: 143 mmol/L (ref 134–144)

## 2018-01-26 LAB — LIPID PANEL
Chol/HDL Ratio: 3.7 ratio (ref 0.0–5.0)
Cholesterol, Total: 136 mg/dL (ref 100–199)
HDL: 37 mg/dL — AB (ref >39)
LDL Calculated: 74 mg/dL (ref 0–99)
TRIGLYCERIDES: 124 mg/dL (ref 0–149)
VLDL Cholesterol Cal: 25 mg/dL (ref 5–40)

## 2018-01-27 ENCOUNTER — Other Ambulatory Visit: Payer: Self-pay | Admitting: Family Medicine

## 2018-01-31 ENCOUNTER — Ambulatory Visit (INDEPENDENT_AMBULATORY_CARE_PROVIDER_SITE_OTHER): Payer: Medicare HMO | Admitting: Family Medicine

## 2018-01-31 ENCOUNTER — Encounter: Payer: Self-pay | Admitting: Family Medicine

## 2018-01-31 VITALS — BP 130/86 | Ht 68.0 in | Wt 139.2 lb

## 2018-01-31 DIAGNOSIS — I10 Essential (primary) hypertension: Secondary | ICD-10-CM

## 2018-01-31 DIAGNOSIS — E785 Hyperlipidemia, unspecified: Secondary | ICD-10-CM | POA: Diagnosis not present

## 2018-01-31 DIAGNOSIS — R1310 Dysphagia, unspecified: Secondary | ICD-10-CM | POA: Diagnosis not present

## 2018-01-31 DIAGNOSIS — R972 Elevated prostate specific antigen [PSA]: Secondary | ICD-10-CM | POA: Diagnosis not present

## 2018-01-31 MED ORDER — PRAVASTATIN SODIUM 40 MG PO TABS: ORAL_TABLET | ORAL | 1 refills | Status: DC

## 2018-01-31 MED ORDER — AMLODIPINE BESYLATE 2.5 MG PO TABS: 2.5000 mg | ORAL_TABLET | Freq: Every day | ORAL | 2 refills | Status: DC

## 2018-01-31 MED ORDER — RAMIPRIL 10 MG PO CAPS: 10.0000 mg | ORAL_CAPSULE | Freq: Every day | ORAL | 2 refills | Status: DC

## 2018-01-31 MED ORDER — HYDROCHLOROTHIAZIDE 12.5 MG PO CAPS: ORAL_CAPSULE | ORAL | 0 refills | Status: DC

## 2018-01-31 NOTE — Progress Notes (Signed)
   Subjective:    Patient ID: Jason Spence, male    DOB: Jul 05, 1941, 77 y.o.   MRN: [id number]  HPI Pt here for 6 month follow up on PSA, hyperlipidemia, HTN. Patient follow-up lipidemia and hypertension watching diet staying active denies high fever chills sweats denies wheezing difficulty breathing did have some slight elevation of PSA but most recent tests look good patient is interested in following this  Pt states once in a while he has trouble swallowing pills and once in a while he states food gets stuck in his throat. Does not happen all the time.    Review of Systems  Constitutional: Negative for activity change, fatigue and fever.  HENT: Negative for congestion and rhinorrhea.   Respiratory: Negative for cough and shortness of breath.   Cardiovascular: Negative for chest pain and leg swelling.  Gastrointestinal: Negative for abdominal pain, diarrhea and nausea.  Genitourinary: Negative for dysuria and hematuria.  Neurological: Negative for weakness and headaches.  Psychiatric/Behavioral: Negative for behavioral problems.       Objective:   Physical Exam  Constitutional: He appears well-nourished. No distress.  HENT:  Head: Normocephalic and atraumatic.  Eyes: Right eye exhibits no discharge. Left eye exhibits no discharge.  Neck: No tracheal deviation present.  Cardiovascular: Normal rate, regular rhythm and normal heart sounds.  No murmur heard. Pulmonary/Chest: Effort normal and breath sounds normal. No respiratory distress. He has no wheezes.  Musculoskeletal: He exhibits no edema.  Lymphadenopathy:    He has no cervical adenopathy.  Neurological: He is alert.  Skin: Skin is warm. No rash noted.  Psychiatric: His behavior is normal.  Vitals reviewed.         Assessment & Plan:  As for the PSA I recommend rechecking this again in the July Patient to follow-up in September Blood pressure good control The likelihood of prostate cancer for this patient is  low likelihood that it will cause any serious problem is also low but per patient's request we will check PSA Patient does have some mild dysphagia recommend referral to gastroenterology

## 2018-02-03 ENCOUNTER — Encounter: Payer: Self-pay | Admitting: Family Medicine

## 2018-02-14 DIAGNOSIS — R69 Illness, unspecified: Secondary | ICD-10-CM | POA: Diagnosis not present

## 2018-02-20 ENCOUNTER — Encounter (INDEPENDENT_AMBULATORY_CARE_PROVIDER_SITE_OTHER): Payer: Self-pay | Admitting: *Deleted

## 2018-02-20 ENCOUNTER — Encounter (INDEPENDENT_AMBULATORY_CARE_PROVIDER_SITE_OTHER): Payer: Self-pay | Admitting: Internal Medicine

## 2018-02-20 ENCOUNTER — Ambulatory Visit (INDEPENDENT_AMBULATORY_CARE_PROVIDER_SITE_OTHER): Payer: Medicare HMO | Admitting: Internal Medicine

## 2018-02-20 VITALS — BP 156/70 | HR 60 | Temp 98.0°F | Ht 68.0 in | Wt 139.5 lb

## 2018-02-20 DIAGNOSIS — R1319 Other dysphagia: Secondary | ICD-10-CM | POA: Insufficient documentation

## 2018-02-20 DIAGNOSIS — R1310 Dysphagia, unspecified: Secondary | ICD-10-CM | POA: Diagnosis not present

## 2018-02-20 NOTE — Patient Instructions (Signed)
The risks of bleeding, perforation and infection were reviewed with patient.  

## 2018-02-20 NOTE — Progress Notes (Signed)
Subjective:    Patient ID: Jason Spence, male    DOB: [birth year], 77 y.o.   MRN: [id number]  HPI  Referred by Dr. Wolfgang Phoenix for dysphagia. He tells me he is having some dysphagia. Large pills will hang in his upper esophagus.  Sometimes when he eats, foods will lodge. Steak will lodge. Symptoms will occur about 3-4 times a weeks. He tries to chew his food well and eat small bites.   Appetite is okay. No weight loss.  BMs are normal.   He has a hx of pill dysphagia and has an EGD/ED in the past.     EGD/ED/colonoscopy in 2014:  Impression:  Distal esophageal stricture dilated with a balloon to 18 mm. Small sliding hiatal hernia. Pancolonic diverticulosis. Small polyp ablated via cold biopsy from transverse colon. Small external hemorrhoids. Anal stenosis.  EGD/ED in 2010 for esophageal dysphagia. Erosive reflux esophagitis with stricture involving the GE junction. Small sliding hiatal hernia. Erosive antral gastritis and erosive bulbar duodenitis. Esophageal stricture dilated to 71mm with a balloon.  Review of Systems Past Medical History:  Diagnosis Date  . Anal stricture 2008  . Arthritis   . Hypercholesteremia   . Hypertension   . Myocardial infarct [facility])     Past Surgical History:  Procedure Laterality Date  . BALLOON DILATION N/A 01/05/2013   Procedure: BALLOON DILATION;  Surgeon: Rogene Houston, MD;  Location: [facility];  Service: Endoscopy;  Laterality: N/A;  . COLONOSCOPY WITH ESOPHAGOGASTRODUODENOSCOPY (EGD) N/A 01/05/2013   Procedure: COLONOSCOPY WITH ESOPHAGOGASTRODUODENOSCOPY (EGD);  Surgeon: Rogene Houston, MD;  Location: [facility];  Service: Endoscopy;  Laterality: N/A;  100  . CORONARY ARTERY BYPASS GRAFT    . HERNIA REPAIR      No Known Allergies  Current Outpatient Medications on File Prior to Visit  Medication Sig Dispense Refill  . amLODipine (NORVASC) 2.5 MG tablet Take 1 tablet (2.5 mg total) by mouth daily. 90 tablet 2  . aspirin 81 MG  tablet Take 81 mg by mouth daily.     Marland Kitchen CINNAMON PO Take by mouth daily.    . fish oil-omega-3 fatty acids 1000 MG capsule Take 1 g by mouth daily.     . fluticasone (FLONASE) 50 MCG/ACT nasal spray USE 2 SPRAY(S) IN EACH NOSTRIL ONCE DAILY 16 g 5  . hydrochlorothiazide (MICROZIDE) 12.5 MG capsule TAKE 1 CAPSULE(12.5 MG) BY MOUTH EVERY MORNING 90 capsule 0  . Multiple Vitamin (MULTIVITAMIN WITH MINERALS) TABS Take 1 tablet by mouth daily.    . pravastatin (PRAVACHOL) 40 MG tablet TAKE 1 TABLET(40 MG) BY MOUTH DAILY 90 tablet 1  . ramipril (ALTACE) 10 MG capsule Take 1 capsule (10 mg total) by mouth daily. 90 capsule 2  . triamcinolone cream (KENALOG) 0.1 % Apply bid prn to skin near left eye prn 30 g 0   No current facility-administered medications on file prior to visit.         Objective:   Physical Exam Blood pressure (!) 156/70, pulse 60, temperature 98 F (36.7 C), height 5\' 8"  (1.727 m), weight 139 lb 8 oz (63.3 kg). Alert and oriented. Skin warm and dry. Oral mucosa is moist.   . Sclera anicteric, conjunctivae is pink. Thyroid not enlarged. No cervical lymphadenopathy. Lungs clear. Heart regular rate and rhythm.  Abdomen is soft. Bowel sounds are positive. No hepatomegaly. No abdominal masses felt. No tenderness.  No edema to lower extremities.  Assessment & Plan:  Dysphagia. Hx of esophageal stricture. EGD/ED. The risks of bleeding, perforation and infection were reviewed with patient.

## 2018-02-27 ENCOUNTER — Other Ambulatory Visit: Payer: Self-pay

## 2018-02-27 ENCOUNTER — Ambulatory Visit (HOSPITAL_COMMUNITY)
Admission: RE | Admit: 2018-02-27 | Discharge: 2018-02-27 | Disposition: A | Discharge status: Home / Self Care | Payer: Medicare HMO | Source: Ambulatory Visit | Attending: Internal Medicine | Admitting: Internal Medicine

## 2018-02-27 ENCOUNTER — Encounter (HOSPITAL_COMMUNITY): Payer: Self-pay | Admitting: *Deleted

## 2018-02-27 ENCOUNTER — Encounter (HOSPITAL_COMMUNITY)
Admission: RE | Disposition: A | Discharge status: Home / Self Care | Payer: Self-pay | Source: Ambulatory Visit | Attending: Internal Medicine

## 2018-02-27 DIAGNOSIS — I252 Old myocardial infarction: Secondary | ICD-10-CM | POA: Insufficient documentation

## 2018-02-27 DIAGNOSIS — Z87891 Personal history of nicotine dependence: Secondary | ICD-10-CM | POA: Diagnosis not present

## 2018-02-27 DIAGNOSIS — I10 Essential (primary) hypertension: Secondary | ICD-10-CM | POA: Diagnosis not present

## 2018-02-27 DIAGNOSIS — R1319 Other dysphagia: Secondary | ICD-10-CM | POA: Insufficient documentation

## 2018-02-27 DIAGNOSIS — E7800 Pure hypercholesterolemia, unspecified: Secondary | ICD-10-CM | POA: Diagnosis not present

## 2018-02-27 DIAGNOSIS — K228 Other specified diseases of esophagus: Secondary | ICD-10-CM | POA: Diagnosis not present

## 2018-02-27 DIAGNOSIS — K210 Gastro-esophageal reflux disease with esophagitis: Secondary | ICD-10-CM | POA: Insufficient documentation

## 2018-02-27 DIAGNOSIS — K222 Esophageal obstruction: Secondary | ICD-10-CM | POA: Insufficient documentation

## 2018-02-27 DIAGNOSIS — R1314 Dysphagia, pharyngoesophageal phase: Secondary | ICD-10-CM | POA: Diagnosis not present

## 2018-02-27 DIAGNOSIS — R1310 Dysphagia, unspecified: Secondary | ICD-10-CM | POA: Insufficient documentation

## 2018-02-27 DIAGNOSIS — Z7982 Long term (current) use of aspirin: Secondary | ICD-10-CM | POA: Insufficient documentation

## 2018-02-27 DIAGNOSIS — Z79899 Other long term (current) drug therapy: Secondary | ICD-10-CM | POA: Insufficient documentation

## 2018-02-27 HISTORY — PX: ESOPHAGEAL DILATION: SHX303

## 2018-02-27 HISTORY — PX: ESOPHAGOGASTRODUODENOSCOPY: SHX5428

## 2018-02-27 HISTORY — PX: BIOPSY: SHX5522

## 2018-02-27 SURGERY — EGD (ESOPHAGOGASTRODUODENOSCOPY)
Anesthesia: Moderate Sedation

## 2018-02-27 MED ORDER — MEPERIDINE HCL 50 MG/ML IJ SOLN
INTRAMUSCULAR | Status: DC | PRN
  Administered 2018-02-27: 25 mg via INTRAVENOUS

## 2018-02-27 MED ORDER — SODIUM CHLORIDE 0.9 % IV SOLN
INTRAVENOUS | Status: DC
  Administered 2018-02-27: 12:00:00 via INTRAVENOUS

## 2018-02-27 MED ORDER — PANTOPRAZOLE SODIUM 40 MG PO TBEC: 40.0000 mg | DELAYED_RELEASE_TABLET | Freq: Every day | ORAL | 5 refills | Status: DC

## 2018-02-27 MED ORDER — LIDOCAINE VISCOUS HCL 2 % MT SOLN
OROMUCOSAL | Status: AC
  Filled 2018-02-27: qty 15

## 2018-02-27 MED ORDER — MIDAZOLAM HCL 5 MG/5ML IJ SOLN
INTRAMUSCULAR | Status: DC | PRN
  Administered 2018-02-27: 1 mg via INTRAVENOUS
  Administered 2018-02-27: 2 mg via INTRAVENOUS

## 2018-02-27 MED ORDER — MIDAZOLAM HCL 5 MG/5ML IJ SOLN
INTRAMUSCULAR | Status: AC
  Filled 2018-02-27: qty 10

## 2018-02-27 MED ORDER — STERILE WATER FOR IRRIGATION IR SOLN
Status: DC | PRN
  Administered 2018-02-27: 12:00:00

## 2018-02-27 MED ORDER — MEPERIDINE HCL 50 MG/ML IJ SOLN
INTRAMUSCULAR | Status: AC
  Filled 2018-02-27: qty 1

## 2018-02-27 MED ORDER — LIDOCAINE VISCOUS HCL 2 % MT SOLN
OROMUCOSAL | Status: DC | PRN
  Administered 2018-02-27: 4 mL via OROMUCOSAL

## 2018-02-27 NOTE — H&P (Signed)
Jason Spence is an 77 y.o. male.   Chief Complaint: Patient is here for EGD and ED. HPI: Patient is 77 year old Caucasian male who has history of esophageal stricture secondary to GERD which has been dilated twice in the past presents with 5 to 70-month history of dysphagia with solids.  He points to lower sternal area sort of bolus obstruction.  Food bolus in the past was down after a few minutes.  He has no difficulty with liquids.  He is not taking any medicine for acid suppression but he does not get heartburn often.  He has good appetite and his weight has been stable.  He denies melena nausea or vomiting. Last aspirin dose was 3 days ago.  Past Medical History:  Diagnosis Date  . Esophageal stricture sec to GERD. 2008  . Arthritis   . Hypercholesteremia   . Hypertension   . Myocardial infarct [facility])     Past Surgical History:  Procedure Laterality Date  . BALLOON DILATION N/A 01/05/2013   Procedure: BALLOON DILATION;  Surgeon: Rogene Houston, MD;  [facility];  Service: Endoscopy;  Laterality: N/A;  . COLONOSCOPY WITH ESOPHAGOGASTRODUODENOSCOPY (EGD) N/A 01/05/2013   Procedure: COLONOSCOPY WITH ESOPHAGOGASTRODUODENOSCOPY (EGD);  Surgeon: Rogene Houston, MD;  Location: [facility];  Service: Endoscopy;  Laterality: N/A;  100  . CORONARY ARTERY BYPASS GRAFT    . HERNIA REPAIR      Family History  Problem Relation Age of Onset  . Hypertension Sister    Social History:  reports that he quit smoking about 22 years ago. He smoked 0.50 packs per day. He has never used smokeless tobacco. He reports that he does not drink alcohol or use drugs.  Allergies: No Known Allergies  Medications Prior to Admission  Medication Sig Dispense Refill  . amLODipine (NORVASC) 2.5 MG tablet Take 1 tablet (2.5 mg total) by mouth daily. 90 tablet 2  . aspirin 81 MG tablet Take 81 mg by mouth daily.     . fish oil-omega-3 fatty acids 1000 MG capsule Take 1 g by mouth daily.     .  fluticasone (FLONASE) 50 MCG/ACT nasal spray USE 2 SPRAY(S) IN EACH NOSTRIL ONCE DAILY (Patient taking differently: Use 2 sprays in each nostril daily as needed for allergies) 16 g 5  . hydrochlorothiazide (MICROZIDE) 12.5 MG capsule TAKE 1 CAPSULE(12.5 MG) BY MOUTH EVERY MORNING 90 capsule 0  . Multiple Vitamins-Minerals (PRESERVISION AREDS 2 PO) Take 1 capsule by mouth 2 (two) times daily.    . pravastatin (PRAVACHOL) 40 MG tablet TAKE 1 TABLET(40 MG) BY MOUTH DAILY (Patient taking differently: Take 40 mg by mouth at bedtime. ) 90 tablet 1  . ramipril (ALTACE) 10 MG capsule Take 1 capsule (10 mg total) by mouth daily. 90 capsule 2  . triamcinolone cream (KENALOG) 0.1 % Apply bid prn to skin near left eye prn (Patient taking differently: Apply 1 application topically 2 (two) times daily as needed (for skin near left eye). ) 30 g 0    No results found for this or any previous visit (from the past 48 hour(s)). No results found.  ROS  Blood pressure (!) 151/62, pulse (!) 57, temperature 98 F (36.7 C), temperature source Oral, resp. rate 15, height 5\' 8"  (1.727 m), weight 139 lb (63 kg), SpO2 98 %. Physical Exam  Constitutional:  Well-developed thin Caucasian male in NAD.  HENT:  Mouth/Throat: Oropharynx is clear and moist.  Eyes: Conjunctivae are normal. No scleral  icterus.  Neck: No thyromegaly present.  Cardiovascular: Normal rate and regular rhythm.  Murmur heard. Faint SEM at left sternal border.  Respiratory: Effort normal and breath sounds normal.  Midsternal scar.  GI: Soft. He exhibits no distension and no mass. There is no tenderness.  Musculoskeletal: He exhibits no edema.  Lymphadenopathy:    He has no cervical adenopathy.  Neurological: He is alert.  Skin: Skin is warm and dry.     Assessment/Plan Solid food dysphagia. History of esophageal stricture. EGD with ED.  Hildred Laser, MD 02/27/2018, 12:15 PM

## 2018-02-27 NOTE — Op Note (Signed)
[facility] Patient Name: Jason Spence Procedure Date: 02/27/2018 12:25 PM MRN: [id number] Date of Birth: 02-Jan-1941 Attending MD: Hildred Laser , MD CSN: [id number] Age: 77 Admit Type: Outpatient Procedure:                Upper GI endoscopy Indications:              Esophageal dysphagia, For therapy of esophageal                            stricture Providers:                Hildred Laser, MD, Otis Peak B. Sharon Seller, RN, Nelma Rothman, Technician Referring MD:             Elayne Snare. Wolfgang Phoenix, MD Medicines:                Lidocaine spray, Meperidine 25 mg IV, Midazolam 3                            mg IV Complications:            No immediate complications. Estimated Blood Loss:     Estimated blood loss was minimal. Procedure:                Pre-Anesthesia Assessment:                           - Prior to the procedure, a History and Physical                            was performed, and patient medications and                            allergies were reviewed. The patient's tolerance of                            previous anesthesia was also reviewed. The risks                            and benefits of the procedure and the sedation                            options and risks were discussed with the patient.                            All questions were answered, and informed consent                            was obtained. Prior Anticoagulants: The patient                            last took aspirin 3 days prior to the procedure.  ASA Grade Assessment: III - A patient with severe                            systemic disease. After reviewing the risks and                            benefits, the patient was deemed in satisfactory                            condition to undergo the procedure.                           After obtaining informed consent, the endoscope was                            passed under direct vision. Throughout  the                            procedure, the patient's blood pressure, pulse, and                            oxygen saturations were monitored continuously. The                            EG-[id number] ([id number]) scope was introduced through the                            mouth, and advanced to the second part of duodenum.                            The upper GI endoscopy was accomplished without                            difficulty. The patient tolerated the procedure                            well. Scope In: 12:27:51 PM Scope Out: 12:38:18 PM Total Procedure Duration: 0 hours 10 minutes 27 seconds  Findings:      The proximal esophagus and mid esophagus were normal.      LA Grade A (one or more mucosal breaks less than 5 mm, not extending       between tops of 2 mucosal folds) esophagitis was found 39 to 40 cm from       the incisors.      One benign-appearing, intrinsic moderate stenosis was found 40 cm from       the incisors. This stenosis measured 1 cm (inner diameter) x less than       one cm (in length). The stenosis was traversed. A TTS dilator was passed       through the scope. Dilation with a 15-16.5-18 mm balloon dilator was       performed to 15 mm and 16.5 mm. The dilation site was examined and       showed moderate improvement in luminal narrowing and no perforation.      The entire examined stomach was  normal.      The duodenal bulb and second portion of the duodenum were normal.      A single 5 mm polyp was found 33 cm from the incisors. Biopsies were       taken with a cold forceps for histology. Impression:               - Normal proximal esophagus and mid esophagus.                           - LA Grade A reflux esophagitis.                           - Benign-appearing esophageal stenosis at GEJ(40                            cm). Dilated.                           - Normal stomach.                           - Normal duodenal bulb and second portion of the                             duodenum.                           - Small esophageal polyp biopsied. Moderate Sedation:      Moderate (conscious) sedation was administered by the endoscopy nurse       and supervised by the endoscopist. The following parameters were       monitored: oxygen saturation, heart rate, blood pressure, CO2       capnography and response to care. Total physician intraservice time was       17 minutes. Recommendation:           - Patient has a contact number available for                            emergencies. The signs and symptoms of potential                            delayed complications were discussed with the                            patient. Return to normal activities tomorrow.                            Written discharge instructions were provided to the                            patient.                           - Resume previous diet today.                           - Continue present medications.                           -  No aspirin, ibuprofen, naproxen, or other                            non-steroidal anti-inflammatory drugs for 1 day.                           - Pantoprazole 40 mg po qam.                           - Await pathology results.                           - Repeat upper endoscopy PRN. Procedure Code(s):        --- Professional ---                           [phone], Esophagogastroduodenoscopy, flexible,                            transoral; with transendoscopic balloon dilation of                            esophagus (less than 30 mm diameter)                           43239, Esophagogastroduodenoscopy, flexible,                            transoral; with biopsy, single or multiple                           G0500, Moderate sedation services provided by the                            same physician or other qualified health care                            professional performing a gastrointestinal                            endoscopic service that  sedation supports,                            requiring the presence of an independent trained                            observer to assist in the monitoring of the                            patient's level of consciousness and physiological                            status; initial 15 minutes of intra-service time;                            patient age 45 years or  older (additional time may                            be reported with [phone], as appropriate) Diagnosis Code(s):        --- Professional ---                           K21.0, Gastro-esophageal reflux disease with                            esophagitis                           K22.2, Esophageal obstruction                           R13.14, Dysphagia, pharyngoesophageal phase CPT copyright 2017 [employer]. All rights reserved. The codes documented in this report are preliminary and upon coder review may  be revised to meet current compliance requirements. Hildred Laser, MD Hildred Laser, MD 02/27/2018 12:53:26 PM This report has been signed electronically. Number of Addenda: 0

## 2018-02-27 NOTE — Discharge Instructions (Signed)
Upper Endoscopy, Care After Refer to this sheet in the next few weeks. These instructions provide you with information about caring for yourself after your procedure. Your health care provider may also give you more specific instructions. Your treatment has been planned according to current medical practices, but problems sometimes occur. Call your health care provider if you have any problems or questions after your procedure. What can I expect after the procedure? After the procedure, it is common to have:  A sore throat.  Bloating.  Nausea.  Follow these instructions at home:  Follow instructions from your health care provider about what to eat or drink after your procedure.  Return to your normal activities as told by your health care provider. Ask your health care provider what activities are safe for you.  Take over-the-counter and prescription medicines only as told by your health care provider.  Do not drive for 24 hours if you received a sedative.  Keep all follow-up visits as told by your health care provider. This is important. Contact a health care provider if:  You have a sore throat that lasts longer than one day.  You have trouble swallowing. Get help right away if:  You have a fever.  You vomit blood or your vomit looks like coffee grounds.  You have bloody, black, or tarry stools.  You have a severe sore throat or you cannot swallow.  You have difficulty breathing.  You have severe pain in your chest or belly. This information is not intended to replace advice given to you by your health care provider. Make sure you discuss any questions you have with your health care provider.   Esophageal Dilatation Esophageal dilatation is a procedure to open a blocked or narrowed part of the esophagus. The esophagus is the long tube in your throat that carries food and liquid from your mouth to your stomach. The procedure is also called esophageal dilation. You may  need this procedure if you have a buildup of scar tissue in your esophagus that makes it difficult, painful, or even impossible to swallow. This can be caused by gastroesophageal reflux disease (GERD). In rare cases, people need this procedure because they have cancer of the esophagus or a problem with the way food moves through the esophagus. Sometimes you may need to have another dilatation to enlarge the opening of the esophagus gradually. Tell a health care provider about:  Any allergies you have.  All medicines you are taking, including vitamins, herbs, eye drops, creams, and over-the-counter medicines.  Any problems you or family members have had with anesthetic medicines.  Any blood disorders you have.  Any surgeries you have had.  Any medical conditions you have.  Any antibiotic medicines you are required to take before dental procedures. What are the risks? Generally, this is a safe procedure. However, problems can occur and include:  Bleeding from a tear in the lining of the esophagus.  A hole (perforation) in the esophagus.  What happens before the procedure?  Do not eat or drink anything after midnight on the night before the procedure or as directed by your health care provider.  Ask your health care provider about changing or stopping your regular medicines. This is especially important if you are taking diabetes medicines or blood thinners.  Plan to have someone take you home after the procedure. What happens during the procedure?  You will be given a medicine that makes you relaxed and sleepy (sedative).  A medicine may be sprayed  or gargled to numb the back of the throat.  Your health care provider can use various instruments to do an esophageal dilatation. During the procedure, the instrument used will be placed in your mouth and passed down into your esophagus. Options include: ? Simple dilators. This instrument is carefully placed in the esophagus to stretch  it. ? Guided wire bougies. In this method, a flexible tube (endoscope) is used to insert a wire into the esophagus. The dilator is passed over this wire to enlarge the esophagus. Then the wire is removed. ? Balloon dilators. An endoscope with a small balloon at the end is passed down into the esophagus. Inflating the balloon gently stretches the esophagus and opens it up. What happens after the procedure?  Your blood pressure, heart rate, breathing rate, and blood oxygen level will be monitored often until the medicines you were given have worn off.  Your throat may feel slightly sore and will probably still feel numb. This will improve slowly over time.  You will not be allowed to eat or drink until the throat numbness has resolved.  If this is a same-day procedure, you may be allowed to go home once you have been able to drink, urinate, and sit on the edge of the bed without nausea or dizziness.  If this is a same-day procedure, you should have a friend or family member with you for the next 24 hours after the procedure. This information is not intended to replace advice given to you by your health care provider. Make sure you discuss any questions you have with your health care provider.   Resume aspirin tomorrow afternoon. Resume other medications as before. Pantoprazole 40 mg by mouth 30 minutes before breakfast daily. Resume usual diet. No driving for 24 hours. Physician will call with biopsy results.

## 2018-03-02 ENCOUNTER — Encounter (HOSPITAL_COMMUNITY): Payer: Self-pay | Admitting: Internal Medicine

## 2018-03-16 DIAGNOSIS — R69 Illness, unspecified: Secondary | ICD-10-CM | POA: Diagnosis not present

## 2018-05-16 DIAGNOSIS — R972 Elevated prostate specific antigen [PSA]: Secondary | ICD-10-CM | POA: Diagnosis not present

## 2018-05-17 LAB — PSA: PROSTATE SPECIFIC AG, SERUM: 4 ng/mL (ref 0.0–4.0)

## 2018-05-18 ENCOUNTER — Telehealth: Payer: Self-pay | Admitting: Family Medicine

## 2018-05-18 ENCOUNTER — Other Ambulatory Visit: Payer: Self-pay

## 2018-05-18 ENCOUNTER — Other Ambulatory Visit: Payer: Self-pay | Admitting: Family Medicine

## 2018-05-18 DIAGNOSIS — Z125 Encounter for screening for malignant neoplasm of prostate: Secondary | ICD-10-CM

## 2018-05-18 MED ORDER — KETOCONAZOLE 2 % EX CREA: TOPICAL_CREAM | CUTANEOUS | 1 refills | Status: DC

## 2018-05-18 NOTE — Telephone Encounter (Signed)
Patient is requesting refill on ketoconazole 60 grams.  Also, calling to check on results to PSA.  [facility]

## 2018-05-18 NOTE — Telephone Encounter (Signed)
Minimal change on PSA It is 4.0 currently I would not recommend further testing right at the moment Definitely recommend follow-up office visit this fall I do recommend repeating the PSA in 6 months Give him the paperwork to do so He may have a refill of the cream apply twice daily as needed

## 2018-05-18 NOTE — Telephone Encounter (Signed)
Labs place, medication sent in. Left message to return call

## 2018-05-18 NOTE — Telephone Encounter (Signed)
Pt returned call. Informed of results and labs orders placed. Pt is setting up appt for follow up. Medication refill sent in. Pt verbalized understanding.

## 2018-05-18 NOTE — Telephone Encounter (Signed)
Pt did have ketoconazole cream in 2016. PSA in computer and completed, just needs resulting by provider. Please advise. Thank you

## 2018-05-30 ENCOUNTER — Ambulatory Visit (INDEPENDENT_AMBULATORY_CARE_PROVIDER_SITE_OTHER): Payer: Medicare HMO | Admitting: Family Medicine

## 2018-05-30 ENCOUNTER — Encounter: Payer: Self-pay | Admitting: Family Medicine

## 2018-05-30 VITALS — BP 140/82 | Temp 98.6°F | Ht 68.0 in | Wt 138.8 lb

## 2018-05-30 DIAGNOSIS — A881 Epidemic vertigo: Secondary | ICD-10-CM

## 2018-05-30 DIAGNOSIS — Z79899 Other long term (current) drug therapy: Secondary | ICD-10-CM | POA: Diagnosis not present

## 2018-05-30 DIAGNOSIS — E7849 Other hyperlipidemia: Secondary | ICD-10-CM | POA: Diagnosis not present

## 2018-05-30 DIAGNOSIS — H8149 Vertigo of central origin, unspecified ear: Secondary | ICD-10-CM | POA: Diagnosis not present

## 2018-05-30 DIAGNOSIS — R42 Dizziness and giddiness: Secondary | ICD-10-CM

## 2018-05-30 DIAGNOSIS — I10 Essential (primary) hypertension: Secondary | ICD-10-CM | POA: Diagnosis not present

## 2018-05-30 DIAGNOSIS — R270 Ataxia, unspecified: Secondary | ICD-10-CM

## 2018-05-30 DIAGNOSIS — R2689 Other abnormalities of gait and mobility: Secondary | ICD-10-CM

## 2018-05-30 DIAGNOSIS — H814 Vertigo of central origin: Secondary | ICD-10-CM

## 2018-05-30 NOTE — Progress Notes (Signed)
   Subjective:    Patient ID: Jason Spence, male    DOB: Apr 21, 1941, 77 y.o.   MRN: [id number]  Dizziness  This is a new problem. The current episode started in the past 7 days. Pertinent negatives include no abdominal pain, chest pain, congestion, coughing, fatigue, headaches, nausea or vomiting. Associated symptoms comments: Left nostril began bleeding when pt blew nose(pt states his nose gets stopped up at night). Exacerbated by: pt got dizzy when he layed down last night.   Pt states he has had these episodes before The patient relates that he has had some intermittent dizziness spells He describes it as being unsteady He feels that when he walks he feels like his body wants to go to the right He denies the room spinning It was worse last night when he was laying down he denies any severe headache denies double vision denies nausea or vomiting He states he feels that his balance is off In the past she has had trouble but they were in the past was vertigo but his symptoms currently are not that   Review of Systems  Constitutional: Negative for activity change, appetite change and fatigue.  HENT: Negative for congestion and rhinorrhea.   Respiratory: Negative for cough and shortness of breath.   Cardiovascular: Negative for chest pain and leg swelling.  Gastrointestinal: Negative for abdominal pain, nausea and vomiting.  Neurological: Positive for dizziness and light-headedness. Negative for headaches.  Psychiatric/Behavioral: Negative for agitation and behavioral problems.       Objective:   Physical Exam  Constitutional: He appears well-nourished. No distress.  HENT:  Head: Normocephalic and atraumatic.  Eyes: Right eye exhibits no discharge. Left eye exhibits no discharge.  Neck: No tracheal deviation present.  Cardiovascular: Normal rate, regular rhythm and normal heart sounds.  No murmur heard. Pulmonary/Chest: Effort normal and breath sounds normal. No respiratory  distress.  Musculoskeletal: He exhibits no edema.  Lymphadenopathy:    He has no cervical adenopathy.  Neurological: He is alert. No sensory deficit. He exhibits normal muscle tone. Coordination normal.  Skin: Skin is warm and dry.  Psychiatric: He has a normal mood and affect. His behavior is normal.  Vitals reviewed.  Romberg negative Finger-to-nose patient does have difficulty with being able to take his finger and touch my finger Patient does have ataxia was unsteady with his walking He stumbled a couple times leaning toward the right There is no nystagmus   Patient has difficult time standing on one foot and taking his heel and rubbing it down his shin   25 minutes was spent with the patient.  This statement verifies that 25 minutes was indeed spent with the patient.  More than 50% of this visit-total duration of the visit-was spent in counseling and coordination of care. The issues that the patient came in for today as reflected in the diagnosis (s) please refer to documentation for further details.     Assessment & Plan:  Because of the lack of nystagmus Also because of ataxia along with balance issues This points toward the possibility of this being a cerebellar stroke The patient needs a MRI in order to make sure that there is not an underlying stroke I do not feel the patient has vertigo in regards to interfere May end up also needing neurology consultation depending on the findings  Await MRI and MRA

## 2018-05-31 ENCOUNTER — Ambulatory Visit (HOSPITAL_COMMUNITY)
Admission: RE | Admit: 2018-05-31 | Discharge: 2018-05-31 | Disposition: A | Discharge status: Home / Self Care | Payer: Medicare HMO | Source: Ambulatory Visit | Attending: Family Medicine | Admitting: Family Medicine

## 2018-05-31 DIAGNOSIS — H748X3 Other specified disorders of middle ear and mastoid, bilateral: Secondary | ICD-10-CM | POA: Diagnosis not present

## 2018-05-31 DIAGNOSIS — R2689 Other abnormalities of gait and mobility: Secondary | ICD-10-CM | POA: Insufficient documentation

## 2018-05-31 DIAGNOSIS — R600 Localized edema: Secondary | ICD-10-CM | POA: Diagnosis not present

## 2018-05-31 DIAGNOSIS — I6782 Cerebral ischemia: Secondary | ICD-10-CM | POA: Diagnosis not present

## 2018-05-31 DIAGNOSIS — H8149 Vertigo of central origin, unspecified ear: Secondary | ICD-10-CM | POA: Diagnosis not present

## 2018-05-31 DIAGNOSIS — R42 Dizziness and giddiness: Secondary | ICD-10-CM | POA: Diagnosis not present

## 2018-06-13 DIAGNOSIS — E7849 Other hyperlipidemia: Secondary | ICD-10-CM | POA: Diagnosis not present

## 2018-06-13 DIAGNOSIS — Z79899 Other long term (current) drug therapy: Secondary | ICD-10-CM | POA: Diagnosis not present

## 2018-06-13 DIAGNOSIS — I10 Essential (primary) hypertension: Secondary | ICD-10-CM | POA: Diagnosis not present

## 2018-06-14 LAB — BASIC METABOLIC PANEL
BUN / CREAT RATIO: 13 (ref 10–24)
BUN: 11 mg/dL (ref 8–27)
CHLORIDE: 100 mmol/L (ref 96–106)
CO2: 26 mmol/L (ref 20–29)
CREATININE: 0.84 mg/dL (ref 0.76–1.27)
Calcium: 9.9 mg/dL (ref 8.6–10.2)
GFR calc non Af Amer: 84 mL/min/{1.73_m2} (ref >59)
GFR, EST AFRICAN AMERICAN: 97 mL/min/{1.73_m2} (ref >59)
Glucose: 90 mg/dL (ref 65–99)
Potassium: 4.5 mmol/L (ref 3.5–5.2)
Sodium: 142 mmol/L (ref 134–144)

## 2018-06-14 LAB — LIPID PANEL
CHOL/HDL RATIO: 3.6 ratio (ref 0.0–5.0)
Cholesterol, Total: 134 mg/dL (ref 100–199)
HDL: 37 mg/dL — ABNORMAL LOW (ref >39)
LDL Calculated: 76 mg/dL (ref 0–99)
TRIGLYCERIDES: 105 mg/dL (ref 0–149)
VLDL Cholesterol Cal: 21 mg/dL (ref 5–40)

## 2018-06-14 LAB — HEPATIC FUNCTION PANEL
ALBUMIN: 4.5 g/dL (ref 3.5–4.8)
ALT: 14 IU/L (ref 0–44)
AST: 26 IU/L (ref 0–40)
Alkaline Phosphatase: 85 IU/L (ref 39–117)
BILIRUBIN TOTAL: 0.3 mg/dL (ref 0.0–1.2)
BILIRUBIN, DIRECT: 0.12 mg/dL (ref 0.00–0.40)
Total Protein: 7.7 g/dL (ref 6.0–8.5)

## 2018-06-20 ENCOUNTER — Encounter: Payer: Self-pay | Admitting: Family Medicine

## 2018-06-20 ENCOUNTER — Ambulatory Visit (INDEPENDENT_AMBULATORY_CARE_PROVIDER_SITE_OTHER): Payer: Medicare HMO | Admitting: Family Medicine

## 2018-06-20 VITALS — BP 122/64 | Ht 68.0 in | Wt 137.6 lb

## 2018-06-20 DIAGNOSIS — Z23 Encounter for immunization: Secondary | ICD-10-CM

## 2018-06-20 DIAGNOSIS — I10 Essential (primary) hypertension: Secondary | ICD-10-CM

## 2018-06-20 DIAGNOSIS — E7849 Other hyperlipidemia: Secondary | ICD-10-CM | POA: Diagnosis not present

## 2018-06-20 MED ORDER — PRAVASTATIN SODIUM 40 MG PO TABS: ORAL_TABLET | ORAL | 1 refills | Status: DC

## 2018-06-20 MED ORDER — HYDROCHLOROTHIAZIDE 12.5 MG PO CAPS: ORAL_CAPSULE | ORAL | 0 refills | Status: DC

## 2018-06-20 NOTE — Patient Instructions (Signed)
Results for orders placed or performed in visit on 05/30/18  Lipid Profile  Result Value Ref Range   Cholesterol, Total 134 100 - 199 mg/dL   Triglycerides 105 0 - 149 mg/dL   HDL 37 (L) >39 mg/dL   VLDL Cholesterol Cal 21 5 - 40 mg/dL   LDL Calculated 76 0 - 99 mg/dL   Chol/HDL Ratio 3.6 0.0 - 5.0 ratio  Hepatic function panel  Result Value Ref Range   Total Protein 7.7 6.0 - 8.5 g/dL   Albumin 4.5 3.5 - 4.8 g/dL   Bilirubin Total 0.3 0.0 - 1.2 mg/dL   Bilirubin, Direct 0.12 0.00 - 0.40 mg/dL   Alkaline Phosphatase 85 39 - 117 IU/L   AST 26 0 - 40 IU/L   ALT 14 0 - 44 IU/L  Basic Metabolic Panel (BMET)  Result Value Ref Range   Glucose 90 65 - 99 mg/dL   BUN 11 8 - 27 mg/dL   Creatinine, Ser 0.84 0.76 - 1.27 mg/dL   GFR calc non Af Amer 84 >59 mL/min/1.73   GFR calc Af Amer 97 >59 mL/min/1.73   BUN/Creatinine Ratio 13 10 - 24   Sodium 142 134 - 144 mmol/L   Potassium 4.5 3.5 - 5.2 mmol/L   Chloride 100 96 - 106 mmol/L   CO2 26 20 - 29 mmol/L   Calcium 9.9 8.6 - 10.2 mg/dL

## 2018-06-20 NOTE — Progress Notes (Signed)
   Subjective:    Patient ID: Jason Spence, male    DOB: 1940/11/26, 77 y.o.   MRN: [id number]  Hyperlipidemia  This is a chronic problem. The current episode started more than 1 year ago. The problem is controlled. Recent lipid tests were reviewed and are normal. He has no history of chronic renal disease or diabetes. Pertinent negatives include no chest pain or shortness of breath. The current treatment provides moderate improvement of lipids. There are no compliance problems.  Risk factors for coronary artery disease include dyslipidemia.  Hypertension  This is a chronic problem. The current episode started more than 1 month ago. The problem has been gradually improving since onset. The problem is controlled. Pertinent negatives include no chest pain, headaches or shortness of breath. Risk factors for coronary artery disease include dyslipidemia. Past treatments include ACE inhibitors and diuretics. There are no compliance problems.  There is no history of chronic renal disease.   Pt would like to get flu shot today Patient overall does well with diet States physically active Denies any chest tightness pressure pain shortness of breath Review of Systems  Constitutional: Negative for diaphoresis and fatigue.  HENT: Negative for congestion and rhinorrhea.   Respiratory: Negative for cough and shortness of breath.   Cardiovascular: Negative for chest pain and leg swelling.  Gastrointestinal: Negative for abdominal pain and diarrhea.  Skin: Negative for color change and rash.  Neurological: Negative for dizziness and headaches.  Psychiatric/Behavioral: Negative for behavioral problems and confusion.       Objective:   Physical Exam  Constitutional: He appears well-nourished. No distress.  HENT:  Head: Normocephalic and atraumatic.  Eyes: Right eye exhibits no discharge. Left eye exhibits no discharge.  Neck: No tracheal deviation present.  Cardiovascular: Normal rate, regular  rhythm and normal heart sounds.  No murmur heard. Pulmonary/Chest: Effort normal and breath sounds normal. No respiratory distress.  Musculoskeletal: He exhibits no edema.  Lymphadenopathy:    He has no cervical adenopathy.  Neurological: He is alert. Coordination normal.  Skin: Skin is warm and dry.  Psychiatric: He has a normal mood and affect. His behavior is normal.  Vitals reviewed.         Assessment & Plan:  HTN good control with current medication continue medicine watch diet stay active  Hyperlipidemia previous labs reviewed continue current medication follow-up again in 6 months  Mild osteoarthritis it is best for the patient to use Tylenol as needed if any ongoing troubles please follow-up Flu shot today

## 2018-08-04 ENCOUNTER — Other Ambulatory Visit: Payer: Self-pay | Admitting: Family Medicine

## 2018-08-22 DIAGNOSIS — R69 Illness, unspecified: Secondary | ICD-10-CM | POA: Diagnosis not present

## 2018-08-29 ENCOUNTER — Other Ambulatory Visit (INDEPENDENT_AMBULATORY_CARE_PROVIDER_SITE_OTHER): Payer: Self-pay | Admitting: Internal Medicine

## 2018-11-01 DIAGNOSIS — Z125 Encounter for screening for malignant neoplasm of prostate: Secondary | ICD-10-CM | POA: Diagnosis not present

## 2018-11-02 LAB — PSA: Prostate Specific Ag, Serum: 4.2 ng/mL — ABNORMAL HIGH (ref 0.0–4.0)

## 2018-11-05 ENCOUNTER — Encounter: Payer: Self-pay | Admitting: Family Medicine

## 2018-11-20 ENCOUNTER — Ambulatory Visit (INDEPENDENT_AMBULATORY_CARE_PROVIDER_SITE_OTHER): Payer: Medicare HMO | Admitting: Family Medicine

## 2018-11-20 ENCOUNTER — Encounter: Payer: Self-pay | Admitting: Family Medicine

## 2018-11-20 VITALS — BP 132/78 | Ht 68.0 in | Wt 137.0 lb

## 2018-11-20 DIAGNOSIS — I10 Essential (primary) hypertension: Secondary | ICD-10-CM

## 2018-11-20 DIAGNOSIS — R972 Elevated prostate specific antigen [PSA]: Secondary | ICD-10-CM | POA: Diagnosis not present

## 2018-11-20 DIAGNOSIS — Z79899 Other long term (current) drug therapy: Secondary | ICD-10-CM | POA: Diagnosis not present

## 2018-11-20 DIAGNOSIS — E7849 Other hyperlipidemia: Secondary | ICD-10-CM | POA: Diagnosis not present

## 2018-11-20 MED ORDER — PRAVASTATIN SODIUM 40 MG PO TABS: ORAL_TABLET | ORAL | 1 refills | Status: DC

## 2018-11-20 MED ORDER — AMLODIPINE BESYLATE 2.5 MG PO TABS: 2.5000 mg | ORAL_TABLET | Freq: Every day | ORAL | 1 refills | Status: DC

## 2018-11-20 MED ORDER — RAMIPRIL 10 MG PO CAPS
10.0000 mg | ORAL_CAPSULE | Freq: Every day | ORAL | 1 refills | Status: DC
Start: 2018-11-20 — End: 2019-05-28

## 2018-11-20 MED ORDER — ZOSTER VAC RECOMB ADJUVANTED 50 MCG/0.5ML IM SUSR: 0.5000 mL | Freq: Once | INTRAMUSCULAR | 1 refills | Status: AC

## 2018-11-20 NOTE — Patient Instructions (Signed)

## 2018-11-20 NOTE — Progress Notes (Signed)
   Subjective:    Patient ID: Jason Spence, male    DOB: [birth year], 78 y.o.   MRN: [id number]  HPI  Patient is here today to follow up on his chronic health issues.  He has a history of hypertension and takes Amlodipine 2.5 mg one per day, Hctz 12.5 once per day, Ramipril 10 mg one per day. Patient for blood pressure check up.  The patient does have hypertension.  The patient is on medication.  Patient relates compliance with meds. Todays BP reviewed with the patient. Patient denies issues with medication. Patient relates reasonable diet. Patient tries to minimize salt. Patient aware of BP goals.   Hyperlipidemia:  Takes Pravachol 40 mg once per day, Fish oil once per day.Patient here for follow-up regarding cholesterol.  The patient does have hyperlipidemia.  Patient does try to maintain a reasonable diet.  Patient does take the medication on a regular basis.  Denies missing a dose.  The patient denies any obvious side effects.  Prior blood work results reviewed with the patient.  The patient is aware of his cholesterol goals and the need to keep it under good control to lessen the risk of disease.   He states he eats healthy, he does get exercise daily.  He sees Dr.Rahmon.  He had Psa drawn 11/01/2018 resulted at 4.2 he states you were going to discuss this with him today.   Review of Systems  Constitutional: Negative for activity change.  HENT: Negative for congestion and rhinorrhea.   Respiratory: Negative for cough and shortness of breath.   Cardiovascular: Negative for chest pain.  Gastrointestinal: Negative for abdominal pain, diarrhea, nausea and vomiting.  Genitourinary: Negative for dysuria and hematuria.  Neurological: Negative for weakness and headaches.  Psychiatric/Behavioral: Negative for behavioral problems and confusion.       Objective:   Physical Exam Vitals signs reviewed.  Cardiovascular:     Rate and Rhythm: Normal rate and regular rhythm.     Heart  sounds: Normal heart sounds. No murmur.  Pulmonary:     Effort: Pulmonary effort is normal.     Breath sounds: Normal breath sounds.  Lymphadenopathy:     Cervical: No cervical adenopathy.  Neurological:     Mental Status: He is alert.  Psychiatric:        Behavior: Behavior normal.           Assessment & Plan:  Blood pressure stable continue current measures no new additions  Overall health doing fairly well  Elevated PSA this is a second reoccurrence of this therefore I recommend consultation with [facility]  Follow-up in 4 to 5 months do lab work before that visit

## 2018-11-22 ENCOUNTER — Encounter: Payer: Self-pay | Admitting: Family Medicine

## 2018-11-29 ENCOUNTER — Other Ambulatory Visit (INDEPENDENT_AMBULATORY_CARE_PROVIDER_SITE_OTHER): Payer: Self-pay | Admitting: *Deleted

## 2018-11-29 MED ORDER — PANTOPRAZOLE SODIUM 40 MG PO TBEC: 40.0000 mg | DELAYED_RELEASE_TABLET | Freq: Every day | ORAL | 1 refills | Status: DC

## 2019-01-18 ENCOUNTER — Ambulatory Visit (INDEPENDENT_AMBULATORY_CARE_PROVIDER_SITE_OTHER): Payer: Medicare HMO | Admitting: Family Medicine

## 2019-01-18 ENCOUNTER — Other Ambulatory Visit: Payer: Self-pay

## 2019-01-18 ENCOUNTER — Telehealth: Payer: Self-pay | Admitting: Family Medicine

## 2019-01-18 DIAGNOSIS — M7742 Metatarsalgia, left foot: Secondary | ICD-10-CM

## 2019-01-18 MED ORDER — NAPROXEN 500 MG PO TABS: ORAL_TABLET | ORAL | 0 refills | Status: DC

## 2019-01-18 NOTE — Telephone Encounter (Signed)
Started during the night. Not taking any medication. Using ice pack and elevating with releif. Sore from top of foot to sole of foot. Pt is able to walk on it but is having to "hobble" on it. Pt is not a diabetic. Pt has been having trouble with nose bleeds due to allergies. Has been using saline. Nose bleeds bout every day when patient get up. [facility] in [place]. Please advise. Thank you. (FYI:Pt did eat 2 hot dogs yesterday and a big slice of pound cake before bed)

## 2019-01-18 NOTE — Telephone Encounter (Signed)
Office visit today via phone If patient has smart phone we can do it via that Otherwise we will do by telephone discussion

## 2019-01-18 NOTE — Progress Notes (Signed)
   Subjective:    Patient ID: Jason Spence, male    DOB: 03-01-1941, 78 y.o.   MRN: [id number]  Foot Pain  This is a new problem. Episode onset: last night.  pain on foot near middle toes. Hurts to bend toes. Pain on top and bottom of foot. A little redness and swelling.  Tried ice pack and it helped some. No injury to foot.  Video was not possible for the patient Virtual Visit via Telephone Note  I connected with Jason Spence on 01/18/19 at  2:00 PM EDT by telephone and verified that I am speaking with the correct person using two identifiers.   I discussed the limitations, risks, security and privacy concerns of performing an evaluation and management service by telephone and the availability of in person appointments. I also discussed with the patient that there may be a patient responsible charge related to this service. The patient expressed understanding and agreed to proceed.   History of Present Illness:    Observations/Objective:   Assessment and Plan:   Follow Up Instructions:    I discussed the assessment and treatment plan with the patient. The patient was provided an opportunity to ask questions and all were answered. The patient agreed with the plan and demonstrated an understanding of the instructions.   The patient was advised to call back or seek an in-person evaluation if the symptoms worsen or if the condition fails to improve as anticipated.  I provided 12 minutes of non-face-to-face time during this encounter.   Jason Bailiff, LPN Patient relates midfoot pain discomfort where the joints in the toe are states is little red does not appear to be infected according to the patient it is not the MTP joint.  This started on a couple days ago yesterday.  Hurts to put some weight on it.  Denies any other particular setbacks.  No known injury.    Review of Systems     Objective:   Physical Exam        Assessment & Plan:  Hard to know if  this is metatarsalgia or the possibility of gout I think more likely metatarsalgia we would use an anti-inflammatory over the course of the next 7 to 10 days Patient was told if not doing better by that span of time to let us know and then we can proceed forward with other measures. If necessary I can look at his foot in his car if he drives up here but currently right now I do not want him to do that

## 2019-01-18 NOTE — Telephone Encounter (Signed)
Pt believes he has gout in his left foot. It is sore, no swelling. He is applying ice to it now to help with the pain. He wants to know what he could take to help with pain ibuprofen or if he should take something different?  CB# [phone].

## 2019-01-18 NOTE — Telephone Encounter (Signed)
Pt contacted and informed that provider would like to have phone visit. Pt does not have a smart phone. Pt transferred up front to set up time.

## 2019-02-02 ENCOUNTER — Other Ambulatory Visit: Payer: Self-pay | Admitting: Family Medicine

## 2019-02-06 DIAGNOSIS — R69 Illness, unspecified: Secondary | ICD-10-CM | POA: Diagnosis not present

## 2019-03-15 DIAGNOSIS — E7849 Other hyperlipidemia: Secondary | ICD-10-CM | POA: Diagnosis not present

## 2019-03-15 DIAGNOSIS — Z125 Encounter for screening for malignant neoplasm of prostate: Secondary | ICD-10-CM | POA: Diagnosis not present

## 2019-03-15 DIAGNOSIS — I10 Essential (primary) hypertension: Secondary | ICD-10-CM | POA: Diagnosis not present

## 2019-03-15 DIAGNOSIS — Z79899 Other long term (current) drug therapy: Secondary | ICD-10-CM | POA: Diagnosis not present

## 2019-03-15 DIAGNOSIS — R972 Elevated prostate specific antigen [PSA]: Secondary | ICD-10-CM | POA: Diagnosis not present

## 2019-03-15 DIAGNOSIS — Z129 Encounter for screening for malignant neoplasm, site unspecified: Secondary | ICD-10-CM | POA: Diagnosis not present

## 2019-03-16 LAB — HEPATIC FUNCTION PANEL
ALBUMIN: 4.6 g/dL (ref 3.7–4.7)
ALK PHOS: 74 IU/L (ref 39–117)
ALT: 18 IU/L (ref 0–44)
AST: 25 IU/L (ref 0–40)
BILIRUBIN, DIRECT: 0.13 mg/dL (ref 0.00–0.40)
Bilirubin Total: 0.5 mg/dL (ref 0.0–1.2)
TOTAL PROTEIN: 7.7 g/dL (ref 6.0–8.5)

## 2019-03-16 LAB — CBC WITH DIFFERENTIAL/PLATELET
BASOS: 1 %
Basophils Absolute: 0.1 10*3/uL (ref 0.0–0.2)
EOS (ABSOLUTE): 1 10*3/uL — ABNORMAL HIGH (ref 0.0–0.4)
EOS: 11 %
HEMATOCRIT: 40.9 % (ref 37.5–51.0)
HEMOGLOBIN: 13.5 g/dL (ref 13.0–17.7)
IMMATURE GRANULOCYTES: 0 %
Immature Grans (Abs): 0 10*3/uL (ref 0.0–0.1)
Lymphocytes Absolute: 1.6 10*3/uL (ref 0.7–3.1)
Lymphs: 17 %
MCH: 27 pg (ref 26.6–33.0)
MCHC: 33 g/dL (ref 31.5–35.7)
MCV: 82 fL (ref 79–97)
MONOS ABS: 0.8 10*3/uL (ref 0.1–0.9)
Monocytes: 9 %
Neutrophils Absolute: 5.7 10*3/uL (ref 1.4–7.0)
Neutrophils: 62 %
Platelets: 278 10*3/uL (ref 150–450)
RBC: 5 x10E6/uL (ref 4.14–5.80)
RDW: 14.2 % (ref 11.6–15.4)
WBC: 9.3 10*3/uL (ref 3.4–10.8)

## 2019-03-16 LAB — LIPID PANEL
Chol/HDL Ratio: 4.2 ratio (ref 0.0–5.0)
Cholesterol, Total: 147 mg/dL (ref 100–199)
HDL: 35 mg/dL — ABNORMAL LOW (ref >39)
LDL Calculated: 73 mg/dL (ref 0–99)
Triglycerides: 193 mg/dL — ABNORMAL HIGH (ref 0–149)
VLDL Cholesterol Cal: 39 mg/dL (ref 5–40)

## 2019-03-16 LAB — BASIC METABOLIC PANEL
BUN / CREAT RATIO: 15 (ref 10–24)
BUN: 13 mg/dL (ref 8–27)
CO2: 26 mmol/L (ref 20–29)
CREATININE: 0.85 mg/dL (ref 0.76–1.27)
Calcium: 10 mg/dL (ref 8.6–10.2)
Chloride: 98 mmol/L (ref 96–106)
GFR calc Af Amer: 96 mL/min/{1.73_m2} (ref >59)
GFR, EST NON AFRICAN AMERICAN: 83 mL/min/{1.73_m2} (ref >59)
Glucose: 91 mg/dL (ref 65–99)
Potassium: 4.1 mmol/L (ref 3.5–5.2)
SODIUM: 138 mmol/L (ref 134–144)

## 2019-03-22 ENCOUNTER — Other Ambulatory Visit: Payer: Self-pay

## 2019-03-22 ENCOUNTER — Ambulatory Visit (INDEPENDENT_AMBULATORY_CARE_PROVIDER_SITE_OTHER): Payer: Medicare HMO | Admitting: Family Medicine

## 2019-03-22 ENCOUNTER — Encounter: Payer: Self-pay | Admitting: Family Medicine

## 2019-03-22 VITALS — BP 126/68 | Wt 131.0 lb

## 2019-03-22 DIAGNOSIS — E7849 Other hyperlipidemia: Secondary | ICD-10-CM

## 2019-03-22 DIAGNOSIS — I10 Essential (primary) hypertension: Secondary | ICD-10-CM | POA: Diagnosis not present

## 2019-03-22 DIAGNOSIS — R972 Elevated prostate specific antigen [PSA]: Secondary | ICD-10-CM

## 2019-03-22 LAB — SPECIMEN STATUS REPORT

## 2019-03-22 NOTE — Progress Notes (Signed)
I have contacted [facility] and they are able to use the recent blood to acquire the PSA. I have contacted patient and informed him. Pt verbalized understanding.

## 2019-03-22 NOTE — Progress Notes (Signed)
   Subjective:    Patient ID: Jason Spence, male    DOB: 1941/08/12, 78 y.o.   MRN: [id number] Telephone only available Hypertension  This is a chronic problem. Pertinent negatives include no chest pain, headaches or shortness of breath. Risk factors for coronary artery disease include male gender. There are no compliance problems.    Pt states that he recently noticed that his breath gives out easier. Pt also wanting to know about his lab work.   The patient denies any type of chest tightness pressure pain but does relate getting short of breath with activity but this happened once on a very hot day when he is working out he states that currently is not causing him trouble and he will watch it closely Virtual Visit via Video Note  I connected with Jason Spence on 03/22/19 at 10:00 AM EDT by a video enabled telemedicine application and verified that I am speaking with the correct person using two identifiers.  Location: Patient: home Provider: office   I discussed the limitations of evaluation and management by telemedicine and the availability of in person appointments. The patient expressed understanding and agreed to proceed.  History of Present Illness:    Observations/Objective:   Assessment and Plan:   Follow Up Instructions:    I discussed the assessment and treatment plan with the patient. The patient was provided an opportunity to ask questions and all were answered. The patient agreed with the plan and demonstrated an understanding of the instructions.   The patient was advised to call back or seek an in-person evaluation if the symptoms worsen or if the condition fails to improve as anticipated.  I provided 15 minutes of non-face-to-face time during this encounter.   Jason Males, LPN     Review of Systems  Constitutional: Negative for diaphoresis and fatigue.  HENT: Negative for congestion and rhinorrhea.   Respiratory: Negative for cough and  shortness of breath.   Cardiovascular: Negative for chest pain and leg swelling.  Gastrointestinal: Negative for abdominal pain and diarrhea.  Skin: Negative for color change and rash.  Neurological: Negative for dizziness and headaches.  Psychiatric/Behavioral: Negative for behavioral problems and confusion.       Objective:   Physical Exam  Today's visit was via telephone Physical exam was not possible for this visit       Assessment & Plan:  As for the shortness of breath I advised the patient will need to go outside to work on nice days or early in the morning If he has ongoing shortness of breath issues this needs to be thoroughly worked up including EKG possible cardiology or pulmonology referral patient for right now states it seems to be stable he would like to watch it he will notify us if ongoing troubles  Hyperlipidemia continue current medications watch diet lab work looks good  Blood pressure good control continue current measures lab work looks good  History of elevated PSA will need to repeat the PSA.  More than likely this will be something that will be followed

## 2019-03-23 LAB — PSA: Prostate Specific Ag, Serum: <0.1 ng/mL (ref 0.0–4.0)

## 2019-03-23 LAB — SPECIMEN STATUS REPORT

## 2019-03-23 NOTE — Progress Notes (Signed)
[employer] faxed a paper over for provider to fill out. ICD code added and faxed to [employer]

## 2019-03-27 ENCOUNTER — Ambulatory Visit: Payer: Medicare HMO | Admitting: Family Medicine

## 2019-04-28 ENCOUNTER — Other Ambulatory Visit: Payer: Self-pay | Admitting: Family Medicine

## 2019-05-15 DIAGNOSIS — R69 Illness, unspecified: Secondary | ICD-10-CM | POA: Diagnosis not present

## 2019-05-27 ENCOUNTER — Other Ambulatory Visit: Payer: Self-pay | Admitting: Family Medicine

## 2019-06-04 ENCOUNTER — Other Ambulatory Visit: Payer: Self-pay | Admitting: Family Medicine

## 2019-07-16 ENCOUNTER — Telehealth: Payer: Self-pay | Admitting: Family Medicine

## 2019-07-16 DIAGNOSIS — I10 Essential (primary) hypertension: Secondary | ICD-10-CM

## 2019-07-16 DIAGNOSIS — R972 Elevated prostate specific antigen [PSA]: Secondary | ICD-10-CM

## 2019-07-16 NOTE — Telephone Encounter (Signed)
Last labs 03/15/19, lipid, liver, bmp, cbc, psa

## 2019-07-16 NOTE — Telephone Encounter (Signed)
Orders put in. Left message to return call to notify pt.  

## 2019-07-16 NOTE — Telephone Encounter (Signed)
Patient has physical on 10/21 and would like labs done.

## 2019-07-16 NOTE — Telephone Encounter (Signed)
Early  lab work this year looked good  I would recommend metabolic 7, PSA Diagnosis hypertension, history elevated PSA

## 2019-07-17 NOTE — Telephone Encounter (Signed)
Pt.notified

## 2019-07-30 ENCOUNTER — Other Ambulatory Visit: Payer: Self-pay | Admitting: Family Medicine

## 2019-08-02 DIAGNOSIS — I10 Essential (primary) hypertension: Secondary | ICD-10-CM | POA: Diagnosis not present

## 2019-08-02 DIAGNOSIS — R972 Elevated prostate specific antigen [PSA]: Secondary | ICD-10-CM | POA: Diagnosis not present

## 2019-08-03 LAB — BASIC METABOLIC PANEL
BUN/Creatinine Ratio: 15 (ref 10–24)
BUN: 13 mg/dL (ref 8–27)
CO2: 28 mmol/L (ref 20–29)
Calcium: 10 mg/dL (ref 8.6–10.2)
Chloride: 101 mmol/L (ref 96–106)
Creatinine, Ser: 0.87 mg/dL (ref 0.76–1.27)
GFR calc Af Amer: 95 mL/min/{1.73_m2} (ref >59)
GFR calc non Af Amer: 82 mL/min/{1.73_m2} (ref >59)
Glucose: 94 mg/dL (ref 65–99)
Potassium: 4.8 mmol/L (ref 3.5–5.2)
Sodium: 141 mmol/L (ref 134–144)

## 2019-08-08 ENCOUNTER — Encounter: Payer: Self-pay | Admitting: Family Medicine

## 2019-08-08 ENCOUNTER — Other Ambulatory Visit: Payer: Self-pay

## 2019-08-08 ENCOUNTER — Ambulatory Visit (INDEPENDENT_AMBULATORY_CARE_PROVIDER_SITE_OTHER): Payer: Medicare HMO | Admitting: Family Medicine

## 2019-08-08 VITALS — BP 132/78 | Temp 98.2°F | Ht 68.0 in | Wt 133.0 lb

## 2019-08-08 DIAGNOSIS — Z23 Encounter for immunization: Secondary | ICD-10-CM

## 2019-08-08 DIAGNOSIS — Z0000 Encounter for general adult medical examination without abnormal findings: Secondary | ICD-10-CM | POA: Diagnosis not present

## 2019-08-08 DIAGNOSIS — H3530 Unspecified macular degeneration: Secondary | ICD-10-CM

## 2019-08-08 DIAGNOSIS — I10 Essential (primary) hypertension: Secondary | ICD-10-CM | POA: Diagnosis not present

## 2019-08-08 DIAGNOSIS — E7849 Other hyperlipidemia: Secondary | ICD-10-CM

## 2019-08-08 DIAGNOSIS — Z125 Encounter for screening for malignant neoplasm of prostate: Secondary | ICD-10-CM | POA: Diagnosis not present

## 2019-08-08 MED ORDER — SHINGRIX 50 MCG/0.5ML IM SUSR: 0.5000 mL | Freq: Once | INTRAMUSCULAR | 1 refills | Status: AC

## 2019-08-08 NOTE — Progress Notes (Signed)
Subjective:    Patient ID: Jason Spence, male    DOB: Nov 03, 1940, 78 y.o.   MRN: [id number]  HPI AWV- Annual Wellness Visit  The patient was seen for their annual wellness visit. The patient's past medical history, surgical history, and family history were reviewed. Pertinent vaccines were reviewed ( tetanus, pneumonia, shingles, flu) The patient's medication list was reviewed and updated.  The height and weight were entered.  BMI recorded in electronic record elsewhere  Cognitive screening was completed. Outcome of Mini - Cog: pass   Falls /depression screening electronically recorded within record elsewhere  Current tobacco usage:none (All patients who use tobacco were given written and verbal information on quitting)  Recent listing of emergency department/hospitalizations over the past year were reviewed.  current specialist the patient sees on a regular basis: eye doctor; Dr.Ramon    Medicare annual wellness visit patient questionnaire was reviewed.  A written screening schedule for the patient for the next 5-10 years was given. Appropriate discussion of followup regarding next visit was discussed.  Patient also has hyperlipidemia and takes his medication regular basis previous labs in the summer look good  Patient does have blood pressure refills will be given.  Continue taking medication minimize salt in the diet.  Blood pressure looks good today.  Patient does try to stay active  Patient does have macular degeneration this impacts his visual abilities which impacts his quality of life he is doing the best he can with illness   Review of Systems  Constitutional: Negative for activity change, appetite change and fever.  HENT: Negative for congestion and rhinorrhea.   Eyes: Negative for discharge.  Respiratory: Negative for cough and wheezing.   Cardiovascular: Negative for chest pain.  Gastrointestinal: Negative for abdominal pain, blood in stool and vomiting.   Genitourinary: Negative for difficulty urinating and frequency.  Musculoskeletal: Negative for neck pain.  Skin: Negative for rash.  Allergic/Immunologic: Negative for environmental allergies and food allergies.  Neurological: Negative for weakness and headaches.  Psychiatric/Behavioral: Negative for agitation.       Objective:   Physical Exam Constitutional:      Appearance: He is well-developed.  HENT:     Head: Normocephalic and atraumatic.     Right Ear: External ear normal.     Left Ear: External ear normal.     Nose: Nose normal.  Eyes:     Pupils: Pupils are equal, round, and reactive to light.  Neck:     Musculoskeletal: Normal range of motion and neck supple.     Thyroid: No thyromegaly.  Cardiovascular:     Rate and Rhythm: Normal rate and regular rhythm.     Heart sounds: Normal heart sounds. No murmur.  Pulmonary:     Effort: Pulmonary effort is normal. No respiratory distress.     Breath sounds: Normal breath sounds. No wheezing.  Abdominal:     General: Bowel sounds are normal. There is no distension.     Palpations: Abdomen is soft. There is no mass.     Tenderness: There is no abdominal tenderness.  Genitourinary:    Penis: Normal.   Musculoskeletal: Normal range of motion.  Lymphadenopathy:     Cervical: No cervical adenopathy.  Skin:    General: Skin is warm and dry.     Findings: No erythema.  Neurological:     Mental Status: He is alert.     Motor: No abnormal muscle tone.  Psychiatric:        Behavior:  Behavior normal.        Judgment: Judgment normal.       Prostate exam was done today slightly enlarged hard    Assessment & Plan:  1. Macular degeneration of both eyes, unspecified type Followed by eye specialist  2. Medicare annual wellness visit, subsequent Adult wellness-complete.wellness physical was conducted today. Importance of diet and exercise were discussed in detail.  In addition to this a discussion regarding safety was  also covered. We also reviewed over immunizations and gave recommendations regarding current immunization needed for age.  In addition to this additional areas were also touched on including: Preventative health exams needed:  Colonoscopy not indicated  Patient was advised yearly wellness exam   3. Need for vaccination Flu shot today  Hypertension overall good control watching salt in diet staying active blood pressure good we will send in refills  Hyperlipidemia labs were checked in the summer we will recheck again in the spring  History of elevated PSA prostate slightly enlarged not not hard we will recheck PSA early next spring

## 2019-08-17 ENCOUNTER — Other Ambulatory Visit: Payer: Self-pay | Admitting: Family Medicine

## 2019-08-21 ENCOUNTER — Other Ambulatory Visit: Payer: Self-pay

## 2019-08-21 ENCOUNTER — Ambulatory Visit (INDEPENDENT_AMBULATORY_CARE_PROVIDER_SITE_OTHER): Payer: Medicare HMO | Admitting: Family Medicine

## 2019-08-21 VITALS — BP 134/76

## 2019-08-21 DIAGNOSIS — R42 Dizziness and giddiness: Secondary | ICD-10-CM | POA: Diagnosis not present

## 2019-08-21 NOTE — Patient Instructions (Signed)
I believe that the medications you are on are too strong for your body  This is a stepwise process I would recommend stopping HCTZ  I would like to see you back again in approximately 3 weeks

## 2019-08-21 NOTE — Progress Notes (Signed)
   Subjective:    Patient ID: Jason Spence, male    DOB: 10/21/1940, 78 y.o.   MRN: [id number]  HPI  Patient states he started with dizziness since weekend- head feels light- trying Meclizine. Patient had a couple times he had some pressure on right side at his hipbone but he does not feel it is related to dizziness. Having allergies and is unsure if dizziness related to that or inner ear. This patient relates the room does not spin no nausea no double vision does not does not wake up with headaches but relates feeling lightheaded energy level is subpar at times feels little rundown at times appetite doing okay.  PMH benign Review of Systems  Constitutional: Positive for fatigue. Negative for activity change and fever.  HENT: Negative for congestion and rhinorrhea.   Respiratory: Negative for cough and shortness of breath.   Cardiovascular: Negative for chest pain.  Gastrointestinal: Negative for abdominal pain, diarrhea, nausea and vomiting.  Genitourinary: Negative for dysuria and hematuria.  Neurological: Positive for dizziness and weakness. Negative for headaches.  Psychiatric/Behavioral: Negative for behavioral problems and confusion.       Objective:   Physical Exam Vitals signs reviewed.  Constitutional:      General: He is not in acute distress. HENT:     Head: Normocephalic and atraumatic.  Eyes:     General:        Right eye: No discharge.        Left eye: No discharge.  Neck:     Trachea: No tracheal deviation.  Cardiovascular:     Rate and Rhythm: Normal rate and regular rhythm.     Heart sounds: Normal heart sounds. No murmur.  Pulmonary:     Effort: Pulmonary effort is normal. No respiratory distress.     Breath sounds: Normal breath sounds.  Lymphadenopathy:     Cervical: No cervical adenopathy.  Skin:    General: Skin is warm and dry.  Neurological:     Mental Status: He is alert.     Coordination: Coordination normal.  Psychiatric:        Behavior:  Behavior normal.   Finger-to-nose is normal negative Romberg. Negative ataxia       Assessment & Plan:  Significant fatigue tiredness along with feeling lightheaded at times Blood pressure was checked sitting and standing he does have a significant drop in blood pressure Therefore I recommend dropping his HCTZ.  I like to see him back within 2 to 3 weeks to see how things go and follow-up sooner problems no need for lab work or scans I find no evidence of any type of stroke

## 2019-09-10 ENCOUNTER — Other Ambulatory Visit: Payer: Self-pay | Admitting: Dermatology

## 2019-09-10 DIAGNOSIS — L820 Inflamed seborrheic keratosis: Secondary | ICD-10-CM | POA: Diagnosis not present

## 2019-09-10 DIAGNOSIS — L570 Actinic keratosis: Secondary | ICD-10-CM | POA: Diagnosis not present

## 2019-09-10 DIAGNOSIS — D229 Melanocytic nevi, unspecified: Secondary | ICD-10-CM | POA: Diagnosis not present

## 2019-09-10 DIAGNOSIS — D485 Neoplasm of uncertain behavior of skin: Secondary | ICD-10-CM | POA: Diagnosis not present

## 2019-09-11 ENCOUNTER — Ambulatory Visit (INDEPENDENT_AMBULATORY_CARE_PROVIDER_SITE_OTHER): Payer: Medicare HMO | Admitting: Family Medicine

## 2019-09-11 ENCOUNTER — Other Ambulatory Visit: Payer: Self-pay

## 2019-09-11 DIAGNOSIS — I10 Essential (primary) hypertension: Secondary | ICD-10-CM

## 2019-09-11 DIAGNOSIS — R42 Dizziness and giddiness: Secondary | ICD-10-CM

## 2019-09-11 DIAGNOSIS — R69 Illness, unspecified: Secondary | ICD-10-CM | POA: Diagnosis not present

## 2019-09-11 NOTE — Progress Notes (Signed)
   Subjective:    Patient ID: Jason Spence, male    DOB: 1940/11/21, 78 y.o.   MRN: [id number]  HPI  Patient calls for a follow up on dizziness. Patient states that a few days after stopping the meds the dizziness went away BP 126/64 Patient states after he stopped the HCTZ within a few days he was less dizzy and felt more energetic it is denied any problems since and overall doing well watching diet staying safe from Covid Virtual Visit via Video Note  I connected with Jason Spence on 09/11/19 at  1:40 PM EST by a video enabled telemedicine application and verified that I am speaking with the correct person using two identifiers. Recent metabolic 7 look good Location: Patient: home Provider: office   I discussed the limitations of evaluation and management by telemedicine and the availability of in person appointments. The patient expressed understanding and agreed to proceed.  History of Present Illness:    Observations/Objective:   Assessment and Plan:   Follow Up Instructions:    I discussed the assessment and treatment plan with the patient. The patient was provided an opportunity to ask questions and all were answered. The patient agreed with the plan and demonstrated an understanding of the instructions.   The patient was advised to call back or seek an in-person evaluation if the symptoms worsen or if the condition fails to improve as anticipated.  I provided 12 minutes of non-face-to-face time during this encounter.      Review of Systems  Constitutional: Negative for diaphoresis and fatigue.  HENT: Negative for congestion and rhinorrhea.   Respiratory: Negative for cough and shortness of breath.   Cardiovascular: Negative for chest pain and leg swelling.  Gastrointestinal: Negative for abdominal pain and diarrhea.  Skin: Negative for color change and rash.  Neurological: Negative for dizziness and headaches.  Psychiatric/Behavioral: Negative for  behavioral problems and confusion.       Objective:   Physical Exam  Today's visit was via telephone Physical exam was not possible for this visit       Assessment & Plan:  Dizziness-overall doing better compared to where he was.  Blood pressure doing fine symptoms went away after he made a change in the medicine tolerating everything well patient will do a follow-up in the springtime follow-up sooner if any issues

## 2019-09-21 ENCOUNTER — Other Ambulatory Visit: Payer: Self-pay | Admitting: Family Medicine

## 2019-09-28 ENCOUNTER — Telehealth: Payer: Self-pay | Admitting: Family Medicine

## 2019-09-28 MED ORDER — RAMIPRIL 10 MG PO CAPS: 10.0000 mg | ORAL_CAPSULE | Freq: Every day | ORAL | 0 refills | Status: DC

## 2019-09-28 NOTE — Telephone Encounter (Signed)
Patient is requesting refill Ramipril 10 mg called into [facility] has appointment for medication follow up on 12/17

## 2019-09-28 NOTE — Telephone Encounter (Signed)
Medication sent in. Pt also wanted to know if he should keep the follow up on 10/04/2019. Looking back in notes from 09/11/2019 it says follow up in springtime unless other concerns. Pt verbalized understanding.

## 2019-09-29 NOTE — Telephone Encounter (Signed)
May have this +1 refill 

## 2019-10-02 ENCOUNTER — Other Ambulatory Visit: Payer: Self-pay | Admitting: Family Medicine

## 2019-10-04 ENCOUNTER — Ambulatory Visit: Payer: Medicare HMO | Admitting: Family Medicine

## 2019-10-08 DIAGNOSIS — R69 Illness, unspecified: Secondary | ICD-10-CM | POA: Diagnosis not present

## 2019-10-09 DIAGNOSIS — Z0100 Encounter for examination of eyes and vision without abnormal findings: Secondary | ICD-10-CM | POA: Diagnosis not present

## 2019-10-09 DIAGNOSIS — H5200 Hypermetropia, unspecified eye: Secondary | ICD-10-CM | POA: Diagnosis not present

## 2019-11-05 ENCOUNTER — Other Ambulatory Visit: Payer: Self-pay | Admitting: Family Medicine

## 2019-11-06 ENCOUNTER — Other Ambulatory Visit: Payer: Self-pay | Admitting: Family Medicine

## 2019-11-06 NOTE — Telephone Encounter (Signed)
Bp check up on 09/11/19

## 2019-11-07 DIAGNOSIS — H353114 Nonexudative age-related macular degeneration, right eye, advanced atrophic with subfoveal involvement: Secondary | ICD-10-CM | POA: Diagnosis not present

## 2019-11-07 DIAGNOSIS — H353221 Exudative age-related macular degeneration, left eye, with active choroidal neovascularization: Secondary | ICD-10-CM | POA: Diagnosis not present

## 2019-11-07 DIAGNOSIS — H35422 Microcystoid degeneration of retina, left eye: Secondary | ICD-10-CM | POA: Diagnosis not present

## 2019-11-12 DIAGNOSIS — H353114 Nonexudative age-related macular degeneration, right eye, advanced atrophic with subfoveal involvement: Secondary | ICD-10-CM | POA: Diagnosis not present

## 2019-11-12 DIAGNOSIS — H353221 Exudative age-related macular degeneration, left eye, with active choroidal neovascularization: Secondary | ICD-10-CM | POA: Diagnosis not present

## 2019-11-28 ENCOUNTER — Telehealth (INDEPENDENT_AMBULATORY_CARE_PROVIDER_SITE_OTHER): Payer: Self-pay | Admitting: Internal Medicine

## 2019-11-29 NOTE — Telephone Encounter (Signed)
Please notify patient that I sent a 90-day supply to his pharmacy as requested but he is overdue for his yearly follow-up, last seen May 2019.  Will need appointment for further refills.Marland Kitchen

## 2019-12-10 DIAGNOSIS — H353114 Nonexudative age-related macular degeneration, right eye, advanced atrophic with subfoveal involvement: Secondary | ICD-10-CM | POA: Diagnosis not present

## 2019-12-10 DIAGNOSIS — H353221 Exudative age-related macular degeneration, left eye, with active choroidal neovascularization: Secondary | ICD-10-CM | POA: Diagnosis not present

## 2020-01-07 DIAGNOSIS — H353221 Exudative age-related macular degeneration, left eye, with active choroidal neovascularization: Secondary | ICD-10-CM | POA: Diagnosis not present

## 2020-01-07 DIAGNOSIS — H353114 Nonexudative age-related macular degeneration, right eye, advanced atrophic with subfoveal involvement: Secondary | ICD-10-CM | POA: Diagnosis not present

## 2020-01-09 DIAGNOSIS — R69 Illness, unspecified: Secondary | ICD-10-CM | POA: Diagnosis not present

## 2020-01-14 ENCOUNTER — Telehealth: Payer: Self-pay | Admitting: Family Medicine

## 2020-01-14 NOTE — Telephone Encounter (Signed)
Last labs completed on 03/15/2019 PSA, CBC, Bmet, Lip and liver. Pt is wanting to know if he can wait until October to have labs done. Please advise. Thank you

## 2020-01-14 NOTE — Telephone Encounter (Signed)
He can keep the appointment in April At that appointment we will discuss doing lab work I think it would be fine for him to do the lab work in the summer with a wellness in October You could let him know his last PSA was in May 2020 and it looked very good

## 2020-01-14 NOTE — Telephone Encounter (Signed)
Patient is scheduled for a 6 mos f/u on 02/06/20 and wanted to know if he was suppose to have his PSA labs done?  He last had it done 03/15/2019 and was worried about insurance paying for it again in less than a year.  He didn't know if he could wait till October when his physical was due?

## 2020-01-14 NOTE — Telephone Encounter (Signed)
Left message to return call 

## 2020-01-15 NOTE — Telephone Encounter (Signed)
Called and discussed with pt. Pt verbalized understanding.  

## 2020-02-04 DIAGNOSIS — H353221 Exudative age-related macular degeneration, left eye, with active choroidal neovascularization: Secondary | ICD-10-CM | POA: Diagnosis not present

## 2020-02-04 DIAGNOSIS — H353114 Nonexudative age-related macular degeneration, right eye, advanced atrophic with subfoveal involvement: Secondary | ICD-10-CM | POA: Diagnosis not present

## 2020-02-05 ENCOUNTER — Telehealth: Payer: Self-pay | Admitting: Family Medicine

## 2020-02-05 NOTE — Telephone Encounter (Signed)
While asking Covid prescreening questions pt states he's having watery eyes & stuffy nose  No to all other symtoms & exposure questions  Should we change pt's 6 month follow up OV to a virtual?  Please advise & call pt   (notify front to make any necessary changes in appt type)

## 2020-02-05 NOTE — Telephone Encounter (Signed)
Left message to return call 

## 2020-02-05 NOTE — Telephone Encounter (Signed)
Nurses talk with patient if patient fully vaccinated I would be fine with still seeing him in person here in the office And the appointment could be moved to 11 AM (To minimize crossing paths with other people)

## 2020-02-05 NOTE — Telephone Encounter (Signed)
Please advise. Thank you

## 2020-02-06 ENCOUNTER — Encounter: Payer: Self-pay | Admitting: Family Medicine

## 2020-02-06 ENCOUNTER — Ambulatory Visit (INDEPENDENT_AMBULATORY_CARE_PROVIDER_SITE_OTHER): Payer: Medicare HMO | Admitting: Family Medicine

## 2020-02-06 ENCOUNTER — Other Ambulatory Visit: Payer: Self-pay

## 2020-02-06 VITALS — BP 128/82 | Temp 98.3°F | Ht 68.0 in | Wt 136.4 lb

## 2020-02-06 DIAGNOSIS — I10 Essential (primary) hypertension: Secondary | ICD-10-CM

## 2020-02-06 DIAGNOSIS — Z125 Encounter for screening for malignant neoplasm of prostate: Secondary | ICD-10-CM

## 2020-02-06 DIAGNOSIS — E7849 Other hyperlipidemia: Secondary | ICD-10-CM | POA: Diagnosis not present

## 2020-02-06 DIAGNOSIS — H3530 Unspecified macular degeneration: Secondary | ICD-10-CM | POA: Diagnosis not present

## 2020-02-06 DIAGNOSIS — K219 Gastro-esophageal reflux disease without esophagitis: Secondary | ICD-10-CM | POA: Diagnosis not present

## 2020-02-06 MED ORDER — RAMIPRIL 10 MG PO CAPS: 10.0000 mg | ORAL_CAPSULE | Freq: Every day | ORAL | 1 refills | Status: DC

## 2020-02-06 MED ORDER — PRAVASTATIN SODIUM 40 MG PO TABS: 40.0000 mg | ORAL_TABLET | Freq: Every day | ORAL | 1 refills | Status: DC

## 2020-02-06 MED ORDER — AMLODIPINE BESYLATE 2.5 MG PO TABS: ORAL_TABLET | ORAL | 1 refills | Status: DC

## 2020-02-06 NOTE — Progress Notes (Signed)
   Subjective:    Patient ID: Jason Spence, male    DOB: Jun 29, 1941, 79 y.o.   MRN: [id number]  Hypertension This is a chronic problem. The current episode started more than 1 year ago. Pertinent negatives include no chest pain, headaches or shortness of breath. Risk factors for coronary artery disease include male gender. Treatments tried: norvasc, altace.   Patient states overall he feels he is doing well taking his cholesterol medicine reflux medicine doing well with that as well   Review of Systems  Constitutional: Negative for activity change, fatigue and fever.  HENT: Negative for congestion and rhinorrhea.   Respiratory: Negative for cough and shortness of breath.   Cardiovascular: Negative for chest pain and leg swelling.  Gastrointestinal: Negative for abdominal pain, diarrhea and nausea.  Genitourinary: Negative for dysuria and hematuria.  Neurological: Positive for light-headedness. Negative for weakness and headaches.  Psychiatric/Behavioral: Negative for agitation and behavioral problems.       Objective:   Physical Exam Vitals reviewed.  Cardiovascular:     Rate and Rhythm: Normal rate and regular rhythm.     Heart sounds: Normal heart sounds. No murmur.  Pulmonary:     Effort: Pulmonary effort is normal.     Breath sounds: Normal breath sounds.  Lymphadenopathy:     Cervical: No cervical adenopathy.  Neurological:     Mental Status: He is alert.  Psychiatric:        Behavior: Behavior normal.           Assessment & Plan:  1. Essential hypertension, benign We will check lab work continue current measures.  Blood pressure good sitting and standing - Hepatic function panel - Basic metabolic panel - CBC with Differential/Platelet  2. Gastroesophageal reflux disease without esophagitis Reflux under good control continue current measures  3. Other hyperlipidemia Check lipid profile - Lipid panel  4. Macular degeneration of both eyes, unspecified  type Significant visual issues.  Wonders if there is any services that could help him with potential impending blindness  5. Screening PSA (prostate specific antigen) PSA all of this in May - PSA

## 2020-02-06 NOTE — Telephone Encounter (Signed)
Pt here in office for visit

## 2020-03-05 DIAGNOSIS — R69 Illness, unspecified: Secondary | ICD-10-CM | POA: Diagnosis not present

## 2020-03-14 DIAGNOSIS — Z125 Encounter for screening for malignant neoplasm of prostate: Secondary | ICD-10-CM | POA: Diagnosis not present

## 2020-03-14 DIAGNOSIS — I10 Essential (primary) hypertension: Secondary | ICD-10-CM | POA: Diagnosis not present

## 2020-03-14 DIAGNOSIS — E7849 Other hyperlipidemia: Secondary | ICD-10-CM | POA: Diagnosis not present

## 2020-03-15 ENCOUNTER — Encounter: Payer: Self-pay | Admitting: Family Medicine

## 2020-03-15 LAB — CBC WITH DIFFERENTIAL/PLATELET
Basophils Absolute: 0.1 x10E3/uL (ref 0.0–0.2)
Basos: 1 %
EOS (ABSOLUTE): 1.4 x10E3/uL — ABNORMAL HIGH (ref 0.0–0.4)
Eos: 15 %
Hematocrit: 41.1 % (ref 37.5–51.0)
Hemoglobin: 13.6 g/dL (ref 13.0–17.7)
Immature Grans (Abs): 0 x10E3/uL (ref 0.0–0.1)
Immature Granulocytes: 0 %
Lymphocytes Absolute: 1.6 x10E3/uL (ref 0.7–3.1)
Lymphs: 18 %
MCH: 28.3 pg (ref 26.6–33.0)
MCHC: 33.1 g/dL (ref 31.5–35.7)
MCV: 86 fL (ref 79–97)
Monocytes Absolute: 0.8 x10E3/uL (ref 0.1–0.9)
Monocytes: 9 %
Neutrophils Absolute: 5.2 x10E3/uL (ref 1.4–7.0)
Neutrophils: 57 %
Platelets: 238 x10E3/uL (ref 150–450)
RBC: 4.8 x10E6/uL (ref 4.14–5.80)
RDW: 13.4 % (ref 11.6–15.4)
WBC: 9.1 x10E3/uL (ref 3.4–10.8)

## 2020-03-15 LAB — BASIC METABOLIC PANEL
BUN/Creatinine Ratio: 18 (ref 10–24)
BUN: 14 mg/dL (ref 8–27)
CO2: 23 mmol/L (ref 20–29)
Calcium: 9.3 mg/dL (ref 8.6–10.2)
Chloride: 104 mmol/L (ref 96–106)
Creatinine, Ser: 0.77 mg/dL (ref 0.76–1.27)
GFR calc Af Amer: 99 mL/min/{1.73_m2} (ref >59)
GFR calc non Af Amer: 86 mL/min/{1.73_m2} (ref >59)
Glucose: 81 mg/dL (ref 65–99)
Potassium: 4.3 mmol/L (ref 3.5–5.2)
Sodium: 141 mmol/L (ref 134–144)

## 2020-03-15 LAB — HEPATIC FUNCTION PANEL
ALT: 14 IU/L (ref 0–44)
AST: 27 IU/L (ref 0–40)
Albumin: 4.3 g/dL (ref 3.7–4.7)
Alkaline Phosphatase: 87 IU/L (ref 48–121)
Bilirubin Total: 0.4 mg/dL (ref 0.0–1.2)
Bilirubin, Direct: 0.13 mg/dL (ref 0.00–0.40)
Total Protein: 7.3 g/dL (ref 6.0–8.5)

## 2020-03-15 LAB — PSA: Prostate Specific Ag, Serum: 3.5 ng/mL (ref 0.0–4.0)

## 2020-03-15 LAB — LIPID PANEL
Chol/HDL Ratio: 2.6 ratio (ref 0.0–5.0)
Cholesterol, Total: 115 mg/dL (ref 100–199)
HDL: 44 mg/dL (ref >39)
LDL Chol Calc (NIH): 55 mg/dL (ref 0–99)
Triglycerides: 80 mg/dL (ref 0–149)
VLDL Cholesterol Cal: 16 mg/dL (ref 5–40)

## 2020-03-18 DIAGNOSIS — H43812 Vitreous degeneration, left eye: Secondary | ICD-10-CM | POA: Diagnosis not present

## 2020-03-18 DIAGNOSIS — H353114 Nonexudative age-related macular degeneration, right eye, advanced atrophic with subfoveal involvement: Secondary | ICD-10-CM | POA: Diagnosis not present

## 2020-03-18 DIAGNOSIS — H353221 Exudative age-related macular degeneration, left eye, with active choroidal neovascularization: Secondary | ICD-10-CM | POA: Diagnosis not present

## 2020-03-18 DIAGNOSIS — H35422 Microcystoid degeneration of retina, left eye: Secondary | ICD-10-CM | POA: Diagnosis not present

## 2020-03-28 ENCOUNTER — Other Ambulatory Visit (INDEPENDENT_AMBULATORY_CARE_PROVIDER_SITE_OTHER): Payer: Self-pay | Admitting: Internal Medicine

## 2020-03-28 NOTE — Telephone Encounter (Signed)
Needs OV for additional refills. Last seen 2019

## 2020-04-15 DIAGNOSIS — H353221 Exudative age-related macular degeneration, left eye, with active choroidal neovascularization: Secondary | ICD-10-CM | POA: Diagnosis not present

## 2020-04-15 DIAGNOSIS — H353114 Nonexudative age-related macular degeneration, right eye, advanced atrophic with subfoveal involvement: Secondary | ICD-10-CM | POA: Diagnosis not present

## 2020-04-15 DIAGNOSIS — H43812 Vitreous degeneration, left eye: Secondary | ICD-10-CM | POA: Diagnosis not present

## 2020-05-06 DIAGNOSIS — Z79899 Other long term (current) drug therapy: Secondary | ICD-10-CM | POA: Diagnosis not present

## 2020-05-06 DIAGNOSIS — I10 Essential (primary) hypertension: Secondary | ICD-10-CM | POA: Diagnosis not present

## 2020-05-06 DIAGNOSIS — H3530 Unspecified macular degeneration: Secondary | ICD-10-CM | POA: Diagnosis not present

## 2020-05-06 DIAGNOSIS — I252 Old myocardial infarction: Secondary | ICD-10-CM | POA: Diagnosis not present

## 2020-05-06 DIAGNOSIS — Z008 Encounter for other general examination: Secondary | ICD-10-CM | POA: Diagnosis not present

## 2020-05-06 DIAGNOSIS — E785 Hyperlipidemia, unspecified: Secondary | ICD-10-CM | POA: Diagnosis not present

## 2020-05-06 DIAGNOSIS — J301 Allergic rhinitis due to pollen: Secondary | ICD-10-CM | POA: Diagnosis not present

## 2020-05-06 DIAGNOSIS — Z803 Family history of malignant neoplasm of breast: Secondary | ICD-10-CM | POA: Diagnosis not present

## 2020-05-06 DIAGNOSIS — I2510 Atherosclerotic heart disease of native coronary artery without angina pectoris: Secondary | ICD-10-CM | POA: Diagnosis not present

## 2020-05-06 DIAGNOSIS — R69 Illness, unspecified: Secondary | ICD-10-CM | POA: Diagnosis not present

## 2020-05-06 DIAGNOSIS — K219 Gastro-esophageal reflux disease without esophagitis: Secondary | ICD-10-CM | POA: Diagnosis not present

## 2020-05-21 ENCOUNTER — Telehealth: Payer: Self-pay | Admitting: Family Medicine

## 2020-05-21 DIAGNOSIS — Z79899 Other long term (current) drug therapy: Secondary | ICD-10-CM

## 2020-05-21 DIAGNOSIS — I10 Essential (primary) hypertension: Secondary | ICD-10-CM

## 2020-05-21 DIAGNOSIS — E7849 Other hyperlipidemia: Secondary | ICD-10-CM

## 2020-05-21 NOTE — Telephone Encounter (Signed)
Patient has physical for October and needing labs done

## 2020-05-21 NOTE — Telephone Encounter (Signed)
Last labs 03/14/20: Lipid, Liver, Met 7, CBC, and PSA

## 2020-05-23 NOTE — Telephone Encounter (Signed)
Please tell patient I reviewed over his previous labs from May Currently I would recommend lipid, met 7 With his PSA registering in a normal range in May I would not recommend repeating it in October

## 2020-05-23 NOTE — Telephone Encounter (Signed)
Blood work ordered in Epic. Patient notified. 

## 2020-07-08 DIAGNOSIS — H353114 Nonexudative age-related macular degeneration, right eye, advanced atrophic with subfoveal involvement: Secondary | ICD-10-CM | POA: Diagnosis not present

## 2020-07-08 DIAGNOSIS — H353221 Exudative age-related macular degeneration, left eye, with active choroidal neovascularization: Secondary | ICD-10-CM | POA: Diagnosis not present

## 2020-07-20 ENCOUNTER — Other Ambulatory Visit (INDEPENDENT_AMBULATORY_CARE_PROVIDER_SITE_OTHER): Payer: Self-pay | Admitting: Gastroenterology

## 2020-07-21 ENCOUNTER — Telehealth (INDEPENDENT_AMBULATORY_CARE_PROVIDER_SITE_OTHER): Payer: Self-pay | Admitting: Gastroenterology

## 2020-07-21 MED ORDER — PANTOPRAZOLE SODIUM 40 MG PO TBEC: 40.0000 mg | DELAYED_RELEASE_TABLET | Freq: Every day | ORAL | 0 refills | Status: DC

## 2020-07-21 NOTE — Telephone Encounter (Signed)
Patient requesting refill of Protonix 40 mg, he was last seen 2019.  He needs to make an office visit for further refills or request refills from his PCP.

## 2020-07-21 NOTE — Telephone Encounter (Signed)
Patient is scheduled for a follow up for med refill

## 2020-07-21 NOTE — Telephone Encounter (Signed)
Last seen 02/20/18 for dysphagia with Terri

## 2020-07-21 NOTE — Telephone Encounter (Signed)
Please notify patient I sent a refill x1 to [facility] for him.  We will refill his year supply at this visit.

## 2020-07-21 NOTE — Telephone Encounter (Signed)
Jason Spence is aware

## 2020-07-30 DIAGNOSIS — Z79899 Other long term (current) drug therapy: Secondary | ICD-10-CM | POA: Diagnosis not present

## 2020-07-30 DIAGNOSIS — I10 Essential (primary) hypertension: Secondary | ICD-10-CM | POA: Diagnosis not present

## 2020-07-30 DIAGNOSIS — E7849 Other hyperlipidemia: Secondary | ICD-10-CM | POA: Diagnosis not present

## 2020-07-31 ENCOUNTER — Encounter (INDEPENDENT_AMBULATORY_CARE_PROVIDER_SITE_OTHER): Payer: Self-pay | Admitting: Gastroenterology

## 2020-07-31 ENCOUNTER — Ambulatory Visit (INDEPENDENT_AMBULATORY_CARE_PROVIDER_SITE_OTHER): Payer: Medicare HMO | Admitting: Gastroenterology

## 2020-07-31 ENCOUNTER — Other Ambulatory Visit: Payer: Self-pay

## 2020-07-31 VITALS — BP 147/76 | HR 80 | Temp 97.0°F | Ht 68.0 in | Wt 129.0 lb

## 2020-07-31 DIAGNOSIS — K219 Gastro-esophageal reflux disease without esophagitis: Secondary | ICD-10-CM | POA: Diagnosis not present

## 2020-07-31 LAB — BASIC METABOLIC PANEL
BUN/Creatinine Ratio: 15 (ref 10–24)
BUN: 13 mg/dL (ref 8–27)
CO2: 26 mmol/L (ref 20–29)
Calcium: 9.5 mg/dL (ref 8.6–10.2)
Chloride: 101 mmol/L (ref 96–106)
Creatinine, Ser: 0.84 mg/dL (ref 0.76–1.27)
GFR calc Af Amer: 96 mL/min/{1.73_m2} (ref >59)
GFR calc non Af Amer: 83 mL/min/{1.73_m2} (ref >59)
Glucose: 90 mg/dL (ref 65–99)
Potassium: 4.5 mmol/L (ref 3.5–5.2)
Sodium: 141 mmol/L (ref 134–144)

## 2020-07-31 LAB — LIPID PANEL
Chol/HDL Ratio: 4 ratio (ref 0.0–5.0)
Cholesterol, Total: 141 mg/dL (ref 100–199)
HDL: 35 mg/dL — ABNORMAL LOW (ref >39)
LDL Chol Calc (NIH): 76 mg/dL (ref 0–99)
Triglycerides: 174 mg/dL — ABNORMAL HIGH (ref 0–149)
VLDL Cholesterol Cal: 30 mg/dL (ref 5–40)

## 2020-07-31 MED ORDER — PANTOPRAZOLE SODIUM 40 MG PO TBEC: 40.0000 mg | DELAYED_RELEASE_TABLET | Freq: Every day | ORAL | 3 refills | Status: DC

## 2020-07-31 NOTE — Progress Notes (Signed)
Patient profile: Jason Spence is a 79 y.o. male seen for evaluation of GERD. Last seen 2018 for GERD.     History of Present Illness: Jason Spence is seen today for follow-up. He is maintained on Protonix 40 mg once a day. He is doing well with his current medication regimen. As long as he takes Protonix he is not having any reflux symptoms. He denies any nausea, vomiting, dysphagia. His weight has been fairly stable over the past year but does note overall losing weight since his wife passed away a few years ago. He denies any postprandial abdominal pain or dysphagia etc. He lives with his son and daughter-in-law and eats dinner with them. He generally has cereal for breakfast and a boost supplement throughout the day.  He denies any lower GI symptoms-having a stool most days, denies constipation, diarrhea, melena, rectal bleeding. Feels well today without complaints   Wt Readings from Last 3 Encounters:  07/31/20 129 lb (58.5 kg)  02/06/20 136 lb 6.4 oz (61.9 kg)  08/08/19 133 lb (60.3 kg)     Last Colonoscopy: 2014-Findings:   Prep excellent. Scattered diverticula throughout the colon but predominantly at sigmoid colon. Small polyp ablated via cold biopsy from transverse colon. Normal rectum mucosa. Small hemorrhoids below the dentate line along with small papillae. Anal stenosis Biopsy results reviewed with patient Polyp is non-adenomatous. Next screening in 10 years   Last Endoscopy: 02/2018- - Normal proximal esophagus and mid esophagus. - LA Grade A reflux esophagitis.                           - Benign-appearing esophageal stenosis at GEJ(40 cm). Dilated.  - Normal stomach.   - Normal duodenal bulb and second portion of the duodenum.  - Small esophageal polyp biopsied     Past Medical History:  Past Medical History:  Diagnosis Date  . Anal stricture 2008  . Arthritis   . Hypercholesteremia   . Hypertension   . Myocardial infarct [facility])     Problem  List: Patient Active Problem List   Diagnosis Date Noted  . Esophageal dysphagia 02/20/2018  . Macular degeneration 01/27/2015  . Fibroma 09/25/2014  . GERD (gastroesophageal reflux disease) 01/10/2014  . Hyperlipidemia 01/10/2014  . Osteoarthritis of left knee 01/10/2014  . Essential hypertension, benign 03/15/2013  . Other dysphagia 12/26/2012  . Encounter for screening colonoscopy 12/26/2012  . Fever 08/02/2012  . Meningismus 08/02/2012  . Headache(784.0) 08/02/2012  . DEGENERATIVE JOINT DISEASE, LEFT KNEE 11/06/2007  . DERANGEMENT MENISCUS 11/06/2007    Past Surgical History: Past Surgical History:  Procedure Laterality Date  . BALLOON DILATION N/A 01/05/2013   Procedure: BALLOON DILATION;  Surgeon: Rogene Houston, MD;  [facility];  Service: Endoscopy;  Laterality: N/A;  . BIOPSY  02/27/2018   Procedure: BIOPSY;  Surgeon: Rogene Houston, MD;  Location: [facility];  Service: Endoscopy;;  esophagus  . COLONOSCOPY WITH ESOPHAGOGASTRODUODENOSCOPY (EGD) N/A 01/05/2013   Procedure: COLONOSCOPY WITH ESOPHAGOGASTRODUODENOSCOPY (EGD);  Surgeon: Rogene Houston, MD;  Location: [facility];  Service: Endoscopy;  Laterality: N/A;  100  . CORONARY ARTERY BYPASS GRAFT    . ESOPHAGEAL DILATION N/A 02/27/2018   Procedure: ESOPHAGEAL DILATION;  Surgeon: Rogene Houston, MD;  Location: [facility];  Service: Endoscopy;  Laterality: N/A;  . ESOPHAGOGASTRODUODENOSCOPY N/A 02/27/2018   Procedure: ESOPHAGOGASTRODUODENOSCOPY (EGD);  Surgeon: Rogene Houston, MD;  Location: [facility];  Service: Endoscopy;  Laterality: N/A;  12:25  . HERNIA REPAIR      Allergies: No Known Allergies    Home Medications:  Current Outpatient Medications:  .  amLODipine (NORVASC) 2.5 MG tablet, TAKE 1 TABLET BY MOUTH ONCE DAILY - NEW DOSE, Disp: 90 tablet, Rfl: 1 .  Ascorbic Acid (VITAMIN C) 1000 MG tablet, Take 1,000 mg by mouth daily., Disp: , Rfl:  .  aspirin 81 MG tablet, Take 1 tablet  (81 mg total) by mouth daily., Disp: 30 tablet, Rfl:  .  fish oil-omega-3 fatty acids 1000 MG capsule, Take 1 g by mouth daily. , Disp: , Rfl:  .  fluticasone (FLONASE) 50 MCG/ACT nasal spray, USE 2 SPRAY(S) IN EACH NOSTRIL ONCE DAILY (Patient taking differently: Use 2 sprays in each nostril daily as needed for allergies), Disp: 16 g, Rfl: 5 .  Multiple Vitamins-Minerals (PRESERVISION AREDS 2 PO), Take 1 capsule by mouth 2 (two) times daily., Disp: , Rfl:  .  pantoprazole (PROTONIX) 40 MG tablet, Take 1 tablet (40 mg total) by mouth daily. Take 30 min before breakfast, Disp: 90 tablet, Rfl: 3 .  pravastatin (PRAVACHOL) 40 MG tablet, Take 1 tablet (40 mg total) by mouth daily., Disp: 90 tablet, Rfl: 1 .  ramipril (ALTACE) 10 MG capsule, Take 1 capsule (10 mg total) by mouth daily., Disp: 90 capsule, Rfl: 1   Family History: family history includes Hypertension in his sister. Reports brother has history of having a colostomy for what sounds like diverticulitis. No known family history of colon cancer   Social History:   reports that he quit smoking about 25 years ago. He smoked 0.50 packs per day. He has never used smokeless tobacco. He reports that he does not drink alcohol and does not use drugs.   Review of Systems: Constitutional: Denies weight loss/weight gain  Eyes: No changes in vision. ENT: No oral lesions, sore throat.  GI: see HPI.  Heme/Lymph: No easy bruising.  CV: No chest pain.  GU: No hematuria.  Integumentary: No rashes.  Neuro: No headaches.  Psych: No depression/anxiety.  Endocrine: No heat/cold intolerance.  Allergic/Immunologic: No urticaria.  Resp: No cough, SOB.  Musculoskeletal: No joint swelling.    Physical Examination: BP (!) 147/76 (BP Location: Right Arm, Patient Position: Sitting, Cuff Size: Normal)   Pulse 80   Temp (!) 97 F (36.1 C) (Temporal)   Ht 5\' 8"  (1.727 m)   Wt 129 lb (58.5 kg)   BMI 19.61 kg/m  Gen: NAD, alert and oriented x 4 HEENT:  PEERLA, EOMI, Neck: supple, no JVD Chest: CTA bilaterally, no wheezes, crackles, or other adventitious sounds CV: RRR, no m/g/c/r Abd: soft, NT, ND, +BS in all four quadrants; no HSM, guarding, ridigity, or rebound tenderness Ext: no edema, well perfused with 2+ pulses, Skin: no rash or lesions noted on observed skin Lymph: no noted LAD  Data Reviewed:  Yesterday-BMP and lipid panel reviewed May 2021-CBC LFTs and BMP normal    Assessment/Plan: Mr. Dutko is a 79 y.o. male seen for yearly follow-up of GERD  1. GERD-doing well on Protonix 40 mg once a day. He has no upper GI symptoms. We will continue PPI. Diet modifications reviewed. He rarely uses NSAIDs for pain & remains upright after meals.  2. Weight loss-discussed in detail and appears stable without alarm symptoms. Relates this to losing his wife, reviewed alarm symptoms to notify me with. He will monitor.   No lower GI symptoms and up-to-date on colonoscopy.  May defer further screening based on age.   Masaki was seen today for dysphagia.  Diagnoses and all orders for this visit:  Chronic GERD  Other orders -     pantoprazole (PROTONIX) 40 MG tablet; Take 1 tablet (40 mg total) by mouth daily. Take 30 min before breakfast     Recommendations: Follow-up in 1 year-sooner if needed   I personally performed the service, non-incident to. (WP)  Laurine Blazer, [facility]

## 2020-07-31 NOTE — Patient Instructions (Addendum)
GERD instructions: -Please avoid lying flat within 2 to 3 hours of eating, this will make reflux symptoms worse. -Some patients find elevating the head of the bed beneficial. -Avoid spicy greasy foods as well as caffeine, coffee, sodas-these food/drinks can worsen heartburn and reflux. -Tobacco will worsen reflux, please try to decrease/eliminate tobacco intake if applicable. -Avoid NSAID products (ibuprofen, aspirin, Advil, Aleve, Goody's, BCs, Alka-Seltzer) - if needing these occasionally please try to take with meal or snack to decrease stomach irritation. -If taking medication for reflux such as prilosec, nexium, aciphex, dexilant, prevacid - take 20-30 minutes before a meal for maximum effectiveness. .   Protonix refilled to pharmacy

## 2020-08-05 DIAGNOSIS — R69 Illness, unspecified: Secondary | ICD-10-CM | POA: Diagnosis not present

## 2020-08-08 ENCOUNTER — Ambulatory Visit (INDEPENDENT_AMBULATORY_CARE_PROVIDER_SITE_OTHER): Payer: Medicare HMO | Admitting: Family Medicine

## 2020-08-08 ENCOUNTER — Encounter: Payer: Self-pay | Admitting: Family Medicine

## 2020-08-08 ENCOUNTER — Other Ambulatory Visit: Payer: Self-pay

## 2020-08-08 VITALS — BP 142/68 | HR 52 | Temp 97.6°F | Ht 68.0 in | Wt 130.0 lb

## 2020-08-08 DIAGNOSIS — I10 Essential (primary) hypertension: Secondary | ICD-10-CM

## 2020-08-08 DIAGNOSIS — E7849 Other hyperlipidemia: Secondary | ICD-10-CM

## 2020-08-08 DIAGNOSIS — Z0000 Encounter for general adult medical examination without abnormal findings: Secondary | ICD-10-CM

## 2020-08-08 DIAGNOSIS — Z23 Encounter for immunization: Secondary | ICD-10-CM

## 2020-08-08 DIAGNOSIS — H3530 Unspecified macular degeneration: Secondary | ICD-10-CM

## 2020-08-08 DIAGNOSIS — H548 Legal blindness, as defined in USA: Secondary | ICD-10-CM | POA: Diagnosis not present

## 2020-08-08 MED ORDER — PRAVASTATIN SODIUM 40 MG PO TABS
40.0000 mg | ORAL_TABLET | Freq: Every day | ORAL | 1 refills | Status: DC
Start: 2020-08-08 — End: 2020-08-13

## 2020-08-08 MED ORDER — AMLODIPINE BESYLATE 2.5 MG PO TABS: ORAL_TABLET | ORAL | 1 refills | Status: DC

## 2020-08-08 MED ORDER — RAMIPRIL 10 MG PO CAPS
10.0000 mg | ORAL_CAPSULE | Freq: Every day | ORAL | 1 refills | Status: DC
Start: 2020-08-08 — End: 2021-02-04

## 2020-08-08 NOTE — Progress Notes (Signed)
Subjective:    Patient ID: Jason Spence, male    DOB: 03/04/1941, 79 y.o.   MRN: [id number]  HPI AWV- Annual Wellness Visit  The patient was seen for their annual wellness visit. The patient's past medical history, surgical history, and family history were reviewed. Pertinent vaccines were reviewed ( tetanus, pneumonia, shingles, flu) The patient's medication list was reviewed and updated.  The height and weight were entered.  BMI recorded in electronic record elsewhere  Cognitive screening was completed. Outcome of Mini - Cog: pass   Falls /depression screening electronically recorded within record elsewhere  Current tobacco usage: none (All patients who use tobacco were given written and verbal information on quitting)  Recent listing of emergency department/hospitalizations over the past year were reviewed.  current specialist the patient sees on a regular basis: eye specialist for injection in eye, dr Laural Golden for reflux,    Medicare annual wellness visit patient questionnaire was reviewed.  A written screening schedule for the patient for the next 5-10 years was given. Appropriate discussion of followup regarding next visit was discussed.       Review of Systems  Constitutional: Negative for activity change.  HENT: Negative for congestion and rhinorrhea.   Respiratory: Negative for cough and shortness of breath.   Cardiovascular: Negative for chest pain.  Gastrointestinal: Negative for abdominal pain, diarrhea, nausea and vomiting.  Genitourinary: Negative for dysuria and hematuria.  Neurological: Negative for weakness and headaches.  Psychiatric/Behavioral: Negative for behavioral problems and confusion.       Objective:   Physical Exam Vitals reviewed.  Constitutional:      General: He is not in acute distress. HENT:     Head: Normocephalic and atraumatic.  Eyes:     General:        Right eye: No discharge.        Left eye: No discharge.  Neck:      Trachea: No tracheal deviation.  Cardiovascular:     Rate and Rhythm: Normal rate and regular rhythm.     Heart sounds: Normal heart sounds. No murmur heard.   Pulmonary:     Effort: Pulmonary effort is normal. No respiratory distress.     Breath sounds: Normal breath sounds.  Lymphadenopathy:     Cervical: No cervical adenopathy.  Skin:    General: Skin is warm and dry.  Neurological:     Mental Status: He is alert.     Coordination: Coordination normal.  Psychiatric:        Behavior: Behavior normal.    Patient watching salt diet taken his medication blood pressure was rechecked multiple times 142/68 best reading No cardiac symptoms no fluid overload      Assessment & Plan:  1. Essential hypertension, benign Blood pressure systolic slightly elevated.  He will work on this measure his readings and send him to [place] within a few weeks continue current measures  2. Other hyperlipidemia Cholesterol decent control continue current measures  3. Need for vaccination Flu shot today - Flu Vaccine QUAD High Dose(Fluad)  4. Encounter for subsequent annual wellness visit (AWV) in Medicare patient Annual wellness visit I have advised him to get a white cane because of his legal blindness.  Patient up-to-date on his immunizations to get Covid booster later this year does not need colonoscopy does not need PSA Adult wellness-complete.wellness physical was conducted today. Importance of diet and exercise were discussed in detail.  In addition to this a discussion regarding safety was also covered.  We also reviewed over immunizations and gave recommendations regarding current immunization needed for age.  In addition to this additional areas were also touched on including: Preventative health exams needed:  Colonoscopy not indicated  Patient was advised yearly wellness exam  5. Macular degeneration of both eyes, unspecified type Following with eye specialist  6. Legally blind Due to  macular degeneration  Follow-up within 6 months

## 2020-08-13 ENCOUNTER — Other Ambulatory Visit: Payer: Self-pay | Admitting: Family Medicine

## 2020-09-17 ENCOUNTER — Other Ambulatory Visit: Payer: Self-pay | Admitting: Family Medicine

## 2020-09-17 NOTE — Telephone Encounter (Signed)
Last seen October for wellness

## 2020-09-30 DIAGNOSIS — H43812 Vitreous degeneration, left eye: Secondary | ICD-10-CM | POA: Diagnosis not present

## 2020-09-30 DIAGNOSIS — H353114 Nonexudative age-related macular degeneration, right eye, advanced atrophic with subfoveal involvement: Secondary | ICD-10-CM | POA: Diagnosis not present

## 2020-09-30 DIAGNOSIS — H353221 Exudative age-related macular degeneration, left eye, with active choroidal neovascularization: Secondary | ICD-10-CM | POA: Diagnosis not present

## 2020-10-28 DIAGNOSIS — H353114 Nonexudative age-related macular degeneration, right eye, advanced atrophic with subfoveal involvement: Secondary | ICD-10-CM | POA: Diagnosis not present

## 2020-10-28 DIAGNOSIS — H43812 Vitreous degeneration, left eye: Secondary | ICD-10-CM | POA: Diagnosis not present

## 2020-10-28 DIAGNOSIS — H353222 Exudative age-related macular degeneration, left eye, with inactive choroidal neovascularization: Secondary | ICD-10-CM | POA: Diagnosis not present

## 2021-01-05 DIAGNOSIS — G8929 Other chronic pain: Secondary | ICD-10-CM | POA: Diagnosis not present

## 2021-01-05 DIAGNOSIS — Z809 Family history of malignant neoplasm, unspecified: Secondary | ICD-10-CM | POA: Diagnosis not present

## 2021-01-05 DIAGNOSIS — K219 Gastro-esophageal reflux disease without esophagitis: Secondary | ICD-10-CM | POA: Diagnosis not present

## 2021-01-05 DIAGNOSIS — I252 Old myocardial infarction: Secondary | ICD-10-CM | POA: Diagnosis not present

## 2021-01-05 DIAGNOSIS — E785 Hyperlipidemia, unspecified: Secondary | ICD-10-CM | POA: Diagnosis not present

## 2021-01-05 DIAGNOSIS — I10 Essential (primary) hypertension: Secondary | ICD-10-CM | POA: Diagnosis not present

## 2021-01-05 DIAGNOSIS — M1990 Unspecified osteoarthritis, unspecified site: Secondary | ICD-10-CM | POA: Diagnosis not present

## 2021-01-05 DIAGNOSIS — H3530 Unspecified macular degeneration: Secondary | ICD-10-CM | POA: Diagnosis not present

## 2021-01-05 DIAGNOSIS — J301 Allergic rhinitis due to pollen: Secondary | ICD-10-CM | POA: Diagnosis not present

## 2021-01-05 DIAGNOSIS — I2510 Atherosclerotic heart disease of native coronary artery without angina pectoris: Secondary | ICD-10-CM | POA: Diagnosis not present

## 2021-01-14 DIAGNOSIS — H5200 Hypermetropia, unspecified eye: Secondary | ICD-10-CM | POA: Diagnosis not present

## 2021-01-20 DIAGNOSIS — H353222 Exudative age-related macular degeneration, left eye, with inactive choroidal neovascularization: Secondary | ICD-10-CM | POA: Diagnosis not present

## 2021-01-20 DIAGNOSIS — H353114 Nonexudative age-related macular degeneration, right eye, advanced atrophic with subfoveal involvement: Secondary | ICD-10-CM | POA: Diagnosis not present

## 2021-01-20 DIAGNOSIS — H43812 Vitreous degeneration, left eye: Secondary | ICD-10-CM | POA: Diagnosis not present

## 2021-01-27 ENCOUNTER — Telehealth: Payer: Self-pay | Admitting: Family Medicine

## 2021-01-27 ENCOUNTER — Telehealth: Payer: Self-pay

## 2021-01-27 DIAGNOSIS — E785 Hyperlipidemia, unspecified: Secondary | ICD-10-CM

## 2021-01-27 DIAGNOSIS — I10 Essential (primary) hypertension: Secondary | ICD-10-CM

## 2021-01-27 DIAGNOSIS — Z79899 Other long term (current) drug therapy: Secondary | ICD-10-CM

## 2021-01-27 NOTE — Telephone Encounter (Signed)
See other pt call from today

## 2021-01-27 NOTE — Telephone Encounter (Signed)
Last labs 07/30/20: Lipid, Met 7

## 2021-01-27 NOTE — Telephone Encounter (Signed)
Patient has 6 month follow up on 4/20 will he need labs  Done.

## 2021-01-27 NOTE — Telephone Encounter (Signed)
Pt has appt on 04/20 will pt need blood work done?   Pt call back [phone]

## 2021-01-27 NOTE — Telephone Encounter (Signed)
Bw orders put in and pt was notified.

## 2021-01-27 NOTE — Telephone Encounter (Signed)
Lipid, liver, met 7 

## 2021-01-28 DIAGNOSIS — E785 Hyperlipidemia, unspecified: Secondary | ICD-10-CM | POA: Diagnosis not present

## 2021-01-28 DIAGNOSIS — I10 Essential (primary) hypertension: Secondary | ICD-10-CM | POA: Diagnosis not present

## 2021-01-28 DIAGNOSIS — Z79899 Other long term (current) drug therapy: Secondary | ICD-10-CM | POA: Diagnosis not present

## 2021-01-29 LAB — HEPATIC FUNCTION PANEL
ALT: 16 IU/L (ref 0–44)
AST: 25 IU/L (ref 0–40)
Albumin: 4.4 g/dL (ref 3.6–4.6)
Alkaline Phosphatase: 87 IU/L (ref 44–121)
Bilirubin Total: 0.5 mg/dL (ref 0.0–1.2)
Bilirubin, Direct: 0.16 mg/dL (ref 0.00–0.40)
Total Protein: 7.4 g/dL (ref 6.0–8.5)

## 2021-01-29 LAB — BASIC METABOLIC PANEL
BUN/Creatinine Ratio: 17 (ref 10–24)
BUN: 16 mg/dL (ref 8–27)
CO2: 24 mmol/L (ref 20–29)
Calcium: 9.2 mg/dL (ref 8.6–10.2)
Chloride: 100 mmol/L (ref 96–106)
Creatinine, Ser: 0.92 mg/dL (ref 0.76–1.27)
Glucose: 88 mg/dL (ref 65–99)
Potassium: 4.2 mmol/L (ref 3.5–5.2)
Sodium: 138 mmol/L (ref 134–144)
eGFR: 84 mL/min/{1.73_m2} (ref >59)

## 2021-01-29 LAB — LIPID PANEL
Chol/HDL Ratio: 3.6 ratio (ref 0.0–5.0)
Cholesterol, Total: 125 mg/dL (ref 100–199)
HDL: 35 mg/dL — ABNORMAL LOW (ref >39)
LDL Chol Calc (NIH): 66 mg/dL (ref 0–99)
Triglycerides: 134 mg/dL (ref 0–149)
VLDL Cholesterol Cal: 24 mg/dL (ref 5–40)

## 2021-02-04 ENCOUNTER — Ambulatory Visit (INDEPENDENT_AMBULATORY_CARE_PROVIDER_SITE_OTHER): Payer: Medicare HMO | Admitting: Family Medicine

## 2021-02-04 ENCOUNTER — Other Ambulatory Visit: Payer: Self-pay

## 2021-02-04 VITALS — BP 136/68 | HR 71 | Temp 97.1°F | Ht 68.0 in | Wt 132.0 lb

## 2021-02-04 DIAGNOSIS — H3530 Unspecified macular degeneration: Secondary | ICD-10-CM

## 2021-02-04 DIAGNOSIS — I10 Essential (primary) hypertension: Secondary | ICD-10-CM

## 2021-02-04 DIAGNOSIS — E7849 Other hyperlipidemia: Secondary | ICD-10-CM

## 2021-02-04 MED ORDER — AMLODIPINE BESYLATE 2.5 MG PO TABS: ORAL_TABLET | ORAL | 1 refills | Status: DC

## 2021-02-04 MED ORDER — PRAVASTATIN SODIUM 40 MG PO TABS: 40.0000 mg | ORAL_TABLET | Freq: Every day | ORAL | 1 refills | Status: DC

## 2021-02-04 MED ORDER — RAMIPRIL 10 MG PO CAPS: 10.0000 mg | ORAL_CAPSULE | Freq: Every day | ORAL | 1 refills | Status: DC

## 2021-02-04 NOTE — Patient Instructions (Signed)
Results for orders placed or performed in visit on 01/27/21  Lipid panel  Result Value Ref Range   Cholesterol, Total 125 100 - 199 mg/dL   Triglycerides 134 0 - 149 mg/dL   HDL 35 (L) >39 mg/dL   VLDL Cholesterol Cal 24 5 - 40 mg/dL   LDL Chol Calc (NIH) 66 0 - 99 mg/dL   Chol/HDL Ratio 3.6 0.0 - 5.0 ratio  Hepatic function panel  Result Value Ref Range   Total Protein 7.4 6.0 - 8.5 g/dL   Albumin 4.4 3.6 - 4.6 g/dL   Bilirubin Total 0.5 0.0 - 1.2 mg/dL   Bilirubin, Direct 0.16 0.00 - 0.40 mg/dL   Alkaline Phosphatase 87 44 - 121 IU/L   AST 25 0 - 40 IU/L   ALT 16 0 - 44 IU/L  Basic metabolic panel  Result Value Ref Range   Glucose 88 65 - 99 mg/dL   BUN 16 8 - 27 mg/dL   Creatinine, Ser 0.92 0.76 - 1.27 mg/dL   eGFR 84 >59 mL/min/1.73   BUN/Creatinine Ratio 17 10 - 24   Sodium 138 134 - 144 mmol/L   Potassium 4.2 3.5 - 5.2 mmol/L   Chloride 100 96 - 106 mmol/L   CO2 24 20 - 29 mmol/L   Calcium 9.2 8.6 - 10.2 mg/dL

## 2021-02-04 NOTE — Progress Notes (Signed)
   Subjective:    Patient ID: Jason Spence, male    DOB: [birth year], 80 y.o.   MRN: [id number]  Hypertension This is a chronic problem. Treatments tried: amlodipine, ramipril.  seasonal allergies, ear noise L Very nice patient Takes his medicine regular basis Deals with macular degeneration Having a very difficult time with this unfortunately legally blind Also has cholesterol issues blood pressure issues tries to stay healthy Having a difficult time maintaining his weight but denies any rectal bleeding chest tightness pressure pain   Review of Systems Please see above    Objective:   Physical Exam Vitals reviewed.  Constitutional:      General: He is not in acute distress. HENT:     Head: Normocephalic and atraumatic.  Eyes:     General:        Right eye: No discharge.        Left eye: No discharge.  Neck:     Trachea: No tracheal deviation.  Cardiovascular:     Rate and Rhythm: Normal rate and regular rhythm.     Heart sounds: Normal heart sounds. No murmur heard.   Pulmonary:     Effort: Pulmonary effort is normal. No respiratory distress.     Breath sounds: Normal breath sounds.  Lymphadenopathy:     Cervical: No cervical adenopathy.  Skin:    General: Skin is warm and dry.  Neurological:     Mental Status: He is alert.     Coordination: Coordination normal.  Psychiatric:        Behavior: Behavior normal.    Results for orders placed or performed in visit on 01/27/21  Lipid panel  Result Value Ref Range   Cholesterol, Total 125 100 - 199 mg/dL   Triglycerides 134 0 - 149 mg/dL   HDL 35 (L) >39 mg/dL   VLDL Cholesterol Cal 24 5 - 40 mg/dL   LDL Chol Calc (NIH) 66 0 - 99 mg/dL   Chol/HDL Ratio 3.6 0.0 - 5.0 ratio  Hepatic function panel  Result Value Ref Range   Total Protein 7.4 6.0 - 8.5 g/dL   Albumin 4.4 3.6 - 4.6 g/dL   Bilirubin Total 0.5 0.0 - 1.2 mg/dL   Bilirubin, Direct 0.16 0.00 - 0.40 mg/dL   Alkaline Phosphatase 87 44 - 121 IU/L    AST 25 0 - 40 IU/L   ALT 16 0 - 44 IU/L  Basic metabolic panel  Result Value Ref Range   Glucose 88 65 - 99 mg/dL   BUN 16 8 - 27 mg/dL   Creatinine, Ser 0.92 0.76 - 1.27 mg/dL   eGFR 84 >59 mL/min/1.73   BUN/Creatinine Ratio 17 10 - 24   Sodium 138 134 - 144 mmol/L   Potassium 4.2 3.5 - 5.2 mmol/L   Chloride 100 96 - 106 mmol/L   CO2 24 20 - 29 mmol/L   Calcium 9.2 8.6 - 10.2 mg/dL          Assessment & Plan:  Patient has underlying coronary artery disease.  Takes 81 mg aspirin he was educated regarding warning signs watch for regarding GI bleed Cholesterol good control continue current measures watch diet Blood pressure good control continue current measures Refills of medications given Follow-up in 4 to 6 months

## 2021-02-25 ENCOUNTER — Ambulatory Visit: Payer: Medicare HMO | Admitting: Physician Assistant

## 2021-02-25 ENCOUNTER — Encounter: Payer: Self-pay | Admitting: Physician Assistant

## 2021-02-25 ENCOUNTER — Other Ambulatory Visit: Payer: Self-pay

## 2021-02-25 DIAGNOSIS — L570 Actinic keratosis: Secondary | ICD-10-CM | POA: Diagnosis not present

## 2021-02-25 DIAGNOSIS — S40252A Superficial foreign body of left shoulder, initial encounter: Secondary | ICD-10-CM | POA: Diagnosis not present

## 2021-02-25 DIAGNOSIS — S40262A Insect bite (nonvenomous) of left shoulder, initial encounter: Secondary | ICD-10-CM

## 2021-02-25 DIAGNOSIS — Z1839 Other retained organic fragments: Secondary | ICD-10-CM

## 2021-02-25 DIAGNOSIS — Z85828 Personal history of other malignant neoplasm of skin: Secondary | ICD-10-CM

## 2021-02-25 NOTE — Progress Notes (Signed)
   Follow-Up Visit   Subjective  Jason Spence is a 80 y.o. male who presents for the following: Annual Exam (Patient here today for yearly skin check, no new concerns. Patient does have a personal history or non mole skin cancer, no family or personal history of melanoma).   The following portions of the chart were reviewed this encounter and updated as appropriate:  Tobacco  Allergies  Meds  Problems  Med Hx  Surg Hx  Fam Hx      Objective  Well appearing patient in no apparent distress; mood and affect are within normal limits.  A focused examination was performed including waist up and legs. Relevant physical exam findings are noted in the Assessment and Plan.  Objective  Left Shoulder - Anterior: Small brown tick with white dot in the center of the back.   Assessment & Plan  Embedded tick of left shoulder, initial encounter Left Shoulder - Anterior  Procedure was explained to the patient. The area was anaesthetised with 1% lidocaine and epinephrine. Tweezers were used to pull entire tick from the skin. No complications. Patient tolerated the procedure well. Patient instructed to call if target lesion, nausea, vomiting or head ache occurs.  AK (actinic keratosis) (6) Right Superior Helix; Left Scaphoid Fossa (2); Right Scaphoid Fossa; Right Temple (2)  Destruction of lesion - Left Scaphoid Fossa, Right Scaphoid Fossa, Right Superior Helix, Right Temple Complexity: simple   Destruction method: cryotherapy   Informed consent: discussed and consent obtained   Timeout:  patient name, date of birth, surgical site, and procedure verified Lesion destroyed using liquid nitrogen: Yes   Cryotherapy cycles:  3 Outcome: patient tolerated procedure well with no complications   Post-procedure details: wound care instructions given     I, Dwanna Goshert, PA-C, have reviewed all documentation's for this visit.  The documentation on 02/25/21 for the exam, diagnosis,  procedures and orders are all accurate and complete.

## 2021-04-10 DIAGNOSIS — Z20822 Contact with and (suspected) exposure to covid-19: Secondary | ICD-10-CM | POA: Diagnosis not present

## 2021-04-10 DIAGNOSIS — R059 Cough, unspecified: Secondary | ICD-10-CM | POA: Diagnosis not present

## 2021-04-10 DIAGNOSIS — J00 Acute nasopharyngitis [common cold]: Secondary | ICD-10-CM | POA: Diagnosis not present

## 2021-05-26 DIAGNOSIS — H353114 Nonexudative age-related macular degeneration, right eye, advanced atrophic with subfoveal involvement: Secondary | ICD-10-CM | POA: Diagnosis not present

## 2021-05-26 DIAGNOSIS — H35032 Hypertensive retinopathy, left eye: Secondary | ICD-10-CM | POA: Diagnosis not present

## 2021-05-26 DIAGNOSIS — H353222 Exudative age-related macular degeneration, left eye, with inactive choroidal neovascularization: Secondary | ICD-10-CM | POA: Diagnosis not present

## 2021-05-26 DIAGNOSIS — H43812 Vitreous degeneration, left eye: Secondary | ICD-10-CM | POA: Diagnosis not present

## 2021-06-08 ENCOUNTER — Ambulatory Visit (INDEPENDENT_AMBULATORY_CARE_PROVIDER_SITE_OTHER): Payer: Medicare HMO | Admitting: Family Medicine

## 2021-06-08 ENCOUNTER — Other Ambulatory Visit: Payer: Self-pay

## 2021-06-08 ENCOUNTER — Telehealth: Payer: Self-pay | Admitting: Family Medicine

## 2021-06-08 VITALS — BP 134/60

## 2021-06-08 DIAGNOSIS — K219 Gastro-esophageal reflux disease without esophagitis: Secondary | ICD-10-CM

## 2021-06-08 DIAGNOSIS — Z125 Encounter for screening for malignant neoplasm of prostate: Secondary | ICD-10-CM | POA: Diagnosis not present

## 2021-06-08 DIAGNOSIS — H6123 Impacted cerumen, bilateral: Secondary | ICD-10-CM | POA: Diagnosis not present

## 2021-06-08 DIAGNOSIS — Z0000 Encounter for general adult medical examination without abnormal findings: Secondary | ICD-10-CM

## 2021-06-08 DIAGNOSIS — E7849 Other hyperlipidemia: Secondary | ICD-10-CM

## 2021-06-08 DIAGNOSIS — I10 Essential (primary) hypertension: Secondary | ICD-10-CM

## 2021-06-08 NOTE — Progress Notes (Signed)
   Subjective:    Patient ID: Jason Spence, male    DOB: [birth year], 80 y.o.   MRN: [id number]  HPI  Reoccurring nasal congestion of the Left nostril Left ear congested  Negative covid and flu test about 2 months ago  Severe macular degeneration-legally blind Balance mildly off at times Review of Systems     Objective:   Physical Exam  General-in no acute distress Eyes-no discharge Lungs-respiratory rate normal, CTA CV-no murmurs,RRR Extremities skin warm dry no edema Neuro grossly normal Behavior normal, alert No tremor noted Patient able to stand and walk without much difficulty Has bilateral cerumen impaction     Assessment & Plan:  Macular degeneration legally blind Cerumen impaction bilateral needs to see ENT for removal Wellness in October Labs before visit in October Patient desires to have PSA ordered

## 2021-06-08 NOTE — Telephone Encounter (Signed)
Labs were placed at pt appt this afternoon

## 2021-06-08 NOTE — Telephone Encounter (Signed)
Patient has physical in October and needing labs

## 2021-07-20 DIAGNOSIS — Z7982 Long term (current) use of aspirin: Secondary | ICD-10-CM | POA: Diagnosis not present

## 2021-07-20 DIAGNOSIS — R040 Epistaxis: Secondary | ICD-10-CM | POA: Diagnosis not present

## 2021-07-20 DIAGNOSIS — H7442 Polyp of left middle ear: Secondary | ICD-10-CM | POA: Insufficient documentation

## 2021-07-20 DIAGNOSIS — H73892 Other specified disorders of tympanic membrane, left ear: Secondary | ICD-10-CM | POA: Diagnosis not present

## 2021-07-20 DIAGNOSIS — H6123 Impacted cerumen, bilateral: Secondary | ICD-10-CM | POA: Diagnosis not present

## 2021-07-20 DIAGNOSIS — Z87891 Personal history of nicotine dependence: Secondary | ICD-10-CM | POA: Diagnosis not present

## 2021-07-30 DIAGNOSIS — H7442 Polyp of left middle ear: Secondary | ICD-10-CM | POA: Diagnosis not present

## 2021-07-30 DIAGNOSIS — H9193 Unspecified hearing loss, bilateral: Secondary | ICD-10-CM | POA: Insufficient documentation

## 2021-08-05 DIAGNOSIS — E7849 Other hyperlipidemia: Secondary | ICD-10-CM | POA: Diagnosis not present

## 2021-08-05 DIAGNOSIS — Z0000 Encounter for general adult medical examination without abnormal findings: Secondary | ICD-10-CM | POA: Diagnosis not present

## 2021-08-05 DIAGNOSIS — I10 Essential (primary) hypertension: Secondary | ICD-10-CM | POA: Diagnosis not present

## 2021-08-05 DIAGNOSIS — Z125 Encounter for screening for malignant neoplasm of prostate: Secondary | ICD-10-CM | POA: Diagnosis not present

## 2021-08-05 DIAGNOSIS — K219 Gastro-esophageal reflux disease without esophagitis: Secondary | ICD-10-CM | POA: Diagnosis not present

## 2021-08-06 LAB — BASIC METABOLIC PANEL
BUN/Creatinine Ratio: 13 (ref 10–24)
BUN: 12 mg/dL (ref 8–27)
CO2: 25 mmol/L (ref 20–29)
Calcium: 9.2 mg/dL (ref 8.6–10.2)
Chloride: 101 mmol/L (ref 96–106)
Creatinine, Ser: 0.89 mg/dL (ref 0.76–1.27)
Glucose: 89 mg/dL (ref 70–99)
Potassium: 4.4 mmol/L (ref 3.5–5.2)
Sodium: 140 mmol/L (ref 134–144)
eGFR: 86 mL/min/{1.73_m2} (ref >59)

## 2021-08-06 LAB — HEPATIC FUNCTION PANEL
ALT: 12 IU/L (ref 0–44)
AST: 20 IU/L (ref 0–40)
Albumin: 4.2 g/dL (ref 3.6–4.6)
Alkaline Phosphatase: 92 IU/L (ref 44–121)
Bilirubin Total: 0.5 mg/dL (ref 0.0–1.2)
Bilirubin, Direct: 0.15 mg/dL (ref 0.00–0.40)
Total Protein: 7.2 g/dL (ref 6.0–8.5)

## 2021-08-06 LAB — LIPID PANEL
Chol/HDL Ratio: 3.6 ratio (ref 0.0–5.0)
Cholesterol, Total: 118 mg/dL (ref 100–199)
HDL: 33 mg/dL — ABNORMAL LOW (ref >39)
LDL Chol Calc (NIH): 64 mg/dL (ref 0–99)
Triglycerides: 116 mg/dL (ref 0–149)
VLDL Cholesterol Cal: 21 mg/dL (ref 5–40)

## 2021-08-06 LAB — PSA: Prostate Specific Ag, Serum: 3.8 ng/mL (ref 0.0–4.0)

## 2021-08-07 DIAGNOSIS — M791 Myalgia, unspecified site: Secondary | ICD-10-CM | POA: Diagnosis not present

## 2021-08-07 DIAGNOSIS — J069 Acute upper respiratory infection, unspecified: Secondary | ICD-10-CM | POA: Diagnosis not present

## 2021-08-10 ENCOUNTER — Encounter: Payer: Medicare HMO | Admitting: Family Medicine

## 2021-08-10 ENCOUNTER — Encounter: Payer: Self-pay | Admitting: Family Medicine

## 2021-08-10 ENCOUNTER — Other Ambulatory Visit: Payer: Self-pay | Admitting: Family Medicine

## 2021-08-10 ENCOUNTER — Telehealth: Payer: Self-pay | Admitting: Family Medicine

## 2021-08-10 NOTE — Telephone Encounter (Signed)
Spoke with patient to schedule AWV.  He explained to me that he had to go to Urgent Care over the weekend due to not feeling well and coughing.  He said that put him on antibiotics but does not feel like he is getting any better.

## 2021-08-10 NOTE — Telephone Encounter (Signed)
Please schedule pt for visit in same day appt with whomever has open slot. Thank you

## 2021-08-10 NOTE — Telephone Encounter (Signed)
Limited slots may give patient a follow-up visit for this week please

## 2021-08-12 ENCOUNTER — Encounter (HOSPITAL_COMMUNITY): Payer: Self-pay

## 2021-08-12 ENCOUNTER — Emergency Department (HOSPITAL_COMMUNITY): Payer: Medicare HMO

## 2021-08-12 ENCOUNTER — Emergency Department (HOSPITAL_COMMUNITY)
Admission: EM | Admit: 2021-08-12 | Discharge: 2021-08-12 | Disposition: A | Discharge status: Home / Self Care | Payer: Medicare HMO | Attending: Emergency Medicine | Admitting: Emergency Medicine

## 2021-08-12 DIAGNOSIS — J9811 Atelectasis: Secondary | ICD-10-CM | POA: Diagnosis not present

## 2021-08-12 DIAGNOSIS — Z87891 Personal history of nicotine dependence: Secondary | ICD-10-CM | POA: Diagnosis not present

## 2021-08-12 DIAGNOSIS — Z79899 Other long term (current) drug therapy: Secondary | ICD-10-CM | POA: Diagnosis not present

## 2021-08-12 DIAGNOSIS — Z20822 Contact with and (suspected) exposure to covid-19: Secondary | ICD-10-CM | POA: Diagnosis not present

## 2021-08-12 DIAGNOSIS — R059 Cough, unspecified: Secondary | ICD-10-CM | POA: Diagnosis not present

## 2021-08-12 DIAGNOSIS — Z7982 Long term (current) use of aspirin: Secondary | ICD-10-CM | POA: Insufficient documentation

## 2021-08-12 DIAGNOSIS — I10 Essential (primary) hypertension: Secondary | ICD-10-CM | POA: Insufficient documentation

## 2021-08-12 DIAGNOSIS — J189 Pneumonia, unspecified organism: Secondary | ICD-10-CM | POA: Diagnosis not present

## 2021-08-12 DIAGNOSIS — J181 Lobar pneumonia, unspecified organism: Secondary | ICD-10-CM | POA: Diagnosis not present

## 2021-08-12 DIAGNOSIS — J90 Pleural effusion, not elsewhere classified: Secondary | ICD-10-CM | POA: Diagnosis not present

## 2021-08-12 LAB — RESP PANEL BY RT-PCR (FLU A&B, COVID) ARPGX2
Influenza A by PCR: NEGATIVE
Influenza B by PCR: NEGATIVE
SARS Coronavirus 2 by RT PCR: NEGATIVE

## 2021-08-12 MED ORDER — AMOXICILLIN-POT CLAVULANATE 875-125 MG PO TABS: 1.0000 | ORAL_TABLET | Freq: Two times a day (BID) | ORAL | 0 refills | Status: DC

## 2021-08-12 MED ORDER — AZITHROMYCIN 250 MG PO TABS: ORAL_TABLET | ORAL | 0 refills | Status: DC

## 2021-08-12 NOTE — ED Notes (Signed)
PCXR in progress

## 2021-08-12 NOTE — ED Triage Notes (Signed)
Pt. Arrived via POV with steady gait. Pt. States they went to urgent care Friday with complaints of a cough and was given amoxicillin. Pt. States their covid was negative. Pt. Has a non-productive cough. Pt. States it has gotten worse and now they have back and rib pain. Pt. Has been taking nyquil and cold medication with no relief.

## 2021-08-12 NOTE — Discharge Instructions (Signed)
Follow-up with your doctor next week for recheck 

## 2021-08-12 NOTE — ED Provider Notes (Signed)
[facility] Provider Note   CSN: [id number] Arrival date & time: 08/12/21  1318     History Chief Complaint  Patient presents with   Cough    Jason Spence is a 80 y.o. male.  Patient with persistent cough.  No chills no dyspnea  The history is provided by the patient and medical records. No language interpreter was used.  Cough Cough characteristics:  Non-productive Sputum characteristics:  Nondescript Severity:  Moderate Onset quality:  Sudden Duration: 1 week. Timing:  Constant Chronicity:  New Smoker: no   Context: not animal exposure   Relieved by:  Nothing Worsened by:  Nothing Ineffective treatments: Amoxicillin. Associated symptoms: no chest pain, no eye discharge, no headaches and no rash       Past Medical History:  Diagnosis Date   Anal stricture 2008   Arthritis    Hypercholesteremia    Hypertension    Myocardial infarct [facility])     Patient Active Problem List   Diagnosis Date Noted   Legally blind 08/08/2020   Esophageal dysphagia 02/20/2018   Macular degeneration of both eyes 01/27/2015   Fibroma 09/25/2014   GERD (gastroesophageal reflux disease) 01/10/2014   Hyperlipidemia 01/10/2014   Osteoarthritis of left knee 01/10/2014   Essential hypertension, benign 03/15/2013   Other dysphagia 12/26/2012   Encounter for screening colonoscopy 12/26/2012   Fever 08/02/2012   Meningismus 08/02/2012   Headache(784.0) 08/02/2012   DEGENERATIVE JOINT DISEASE, LEFT KNEE 11/06/2007   DERANGEMENT MENISCUS 11/06/2007    Past Surgical History:  Procedure Laterality Date   BALLOON DILATION N/A 01/05/2013   Procedure: BALLOON DILATION;  Surgeon: Rogene Houston, MD;  Location[facility];  Service: Endoscopy;  Laterality: N/A;   BIOPSY  02/27/2018   Procedure: BIOPSY;  Surgeon: Rogene Houston, MD;  Location: [facility];  Service: Endoscopy;;  esophagus   COLONOSCOPY WITH ESOPHAGOGASTRODUODENOSCOPY (EGD) N/A 01/05/2013    Procedure: COLONOSCOPY WITH ESOPHAGOGASTRODUODENOSCOPY (EGD);  Surgeon: Rogene Houston, MD;  Location: [facility];  Service: Endoscopy;  Laterality: N/A;  100   CORONARY ARTERY BYPASS GRAFT     ESOPHAGEAL DILATION N/A 02/27/2018   Procedure: ESOPHAGEAL DILATION;  Surgeon: Rogene Houston, MD;  Location: [facility];  Service: Endoscopy;  Laterality: N/A;   ESOPHAGOGASTRODUODENOSCOPY N/A 02/27/2018   Procedure: ESOPHAGOGASTRODUODENOSCOPY (EGD);  Surgeon: Rogene Houston, MD;  Location: [facility];  Service: Endoscopy;  Laterality: N/A;  12:25   HERNIA REPAIR         Family History  Problem Relation Age of Onset   Hypertension Sister     Social History   Tobacco Use   Smoking status: Former    Packs/day: 0.50    Types: Cigarettes    Quit date: 08/04/1995    Years since quitting: 26.0   Smokeless tobacco: Never  Vaping Use   Vaping Use: Never used  Substance Use Topics   Alcohol use: No   Drug use: Never    Home Medications Prior to Admission medications   Medication Sig Start Date End Date Taking? Authorizing Provider  amoxicillin-clavulanate (AUGMENTIN) 875-125 MG tablet Take 1 tablet by mouth 2 (two) times daily. One po bid x 7 days 08/12/21  Yes Milton Ferguson, MD  azithromycin (ZITHROMAX Z-PAK) 250 MG tablet Take 2 tablets the first day then 1 tablet each day after 08/12/21  Yes Milton Ferguson, MD  amLODipine (NORVASC) 2.5 MG tablet TAKE 1 TABLET BY MOUTH ONCE DAILY - NEW DOSE 02/04/21  Kathyrn Drown, MD  Ascorbic Acid (VITAMIN C) 1000 MG tablet Take 1,000 mg by mouth daily.    [provider]  aspirin 81 MG tablet Take 1 tablet (81 mg total) by mouth daily. 02/28/18   Rehman, Mechele Dawley, MD  fish oil-omega-3 fatty acids 1000 MG capsule Take 1 g by mouth daily.     [provider]  fluticasone (FLONASE) 50 MCG/ACT nasal spray USE 2 SPRAY(S) IN EACH NOSTRIL ONCE DAILY Patient not taking: Reported on 06/08/2021 01/27/18   Kathyrn Drown, MD   ketoconazole (NIZORAL) 2 % cream APPLY TOPICALLY TO AFFECTED AREA TWICE DAILY AS NEEDED 09/17/20   Elvia Collum M, DO  Multiple Vitamins-Minerals (OCUVITE PRESERVISION PO) Take by mouth.    [provider]  Multiple Vitamins-Minerals (PRESERVISION AREDS 2 PO) Take 1 capsule by mouth 2 (two) times daily.    [provider]  pantoprazole (PROTONIX) 40 MG tablet Take 1 tablet (40 mg total) by mouth daily. Take 30 min before breakfast 07/31/20   Laurine Blazer B, PA-C  pravastatin (PRAVACHOL) 40 MG tablet Take 1 tablet by mouth once daily 08/10/21   Kathyrn Drown, MD  ramipril (ALTACE) 10 MG capsule Take 1 capsule (10 mg total) by mouth daily. 02/04/21   Kathyrn Drown, MD    Allergies    Patient has no known allergies.  Review of Systems   Review of Systems  Constitutional:  Negative for appetite change and fatigue.  HENT:  Negative for congestion, ear discharge and sinus pressure.   Eyes:  Negative for discharge.  Respiratory:  Positive for cough.   Cardiovascular:  Negative for chest pain.  Gastrointestinal:  Negative for abdominal pain and diarrhea.  Genitourinary:  Negative for frequency and hematuria.  Musculoskeletal:  Negative for back pain.  Skin:  Negative for rash.  Neurological:  Negative for seizures and headaches.  Psychiatric/Behavioral:  Negative for hallucinations.    Physical Exam Updated Vital Signs BP (!) 143/57 (BP Location: Right Arm)   Pulse 65   Temp 98.7 F (37.1 C)   Resp 16   Ht 5\' 7"  (1.702 m)   Wt 58.9 kg   SpO2 96%   BMI 20.33 kg/m   Physical Exam Vitals and nursing note reviewed.  Constitutional:      Appearance: He is well-developed.  HENT:     Head: Normocephalic.     Nose: Nose normal.  Eyes:     General: No scleral icterus.    Conjunctiva/sclera: Conjunctivae normal.  Neck:     Thyroid: No thyromegaly.  Cardiovascular:     Rate and Rhythm: Normal rate and regular rhythm.     Heart sounds: No murmur heard.   No  friction rub. No gallop.  Pulmonary:     Breath sounds: No stridor. No wheezing or rales.  Chest:     Chest wall: No tenderness.  Abdominal:     General: There is no distension.     Tenderness: There is no abdominal tenderness. There is no rebound.  Musculoskeletal:        General: Normal range of motion.     Cervical back: Neck supple.  Lymphadenopathy:     Cervical: No cervical adenopathy.  Skin:    Findings: No erythema or rash.  Neurological:     Mental Status: He is alert and oriented to person, place, and time.     Motor: No abnormal muscle tone.     Coordination: Coordination normal.  Psychiatric:  Behavior: Behavior normal.    ED Results / Procedures / Treatments   Labs (all labs ordered are listed, but only abnormal results are displayed) Labs Reviewed  RESP PANEL BY RT-PCR (FLU A&B, COVID) [id number]    EKG None  Radiology DG Chest Port 1 View  Result Date: 08/12/2021 CLINICAL DATA:  Cough. Patient was seen at urgent care 5 days ago and started on amoxicillin. Nonproductive cough for several days with body aches. Former smoker. COVID test negative. EXAM: PORTABLE CHEST 1 VIEW COMPARISON:  Chest radiograph 07/31/2012 FINDINGS: Heart is normal in size. Postoperative changes of median sternotomy and coronary artery bypass graft. Aortic calcifications. Patchy airspace opacity in the peripheral left mid lung. Trace pleural fluid bilaterally with bibasilar atelectasis. Right apical pleural thickening. No pneumothorax. Surgical clips overlie the medial left upper abdomen. IMPRESSION: Patchy airspace opacity in the peripheral left mid lung, likely pneumonia given the clinical context. Recommend repeat radiograph in 6-8 weeks to assess for resolution. Trace bilateral pleural effusions. Electronically Signed   By: Ileana Roup M.D.   On: 08/12/2021 16:03    Procedures Procedures   Medications Ordered in ED Medications - No data to display  ED Course  I have reviewed  the triage vital signs and the nursing notes.  Pertinent labs & imaging results that were available during my care of the patient were reviewed by me and considered in my medical decision making (see chart for details).    MDM Rules/Calculators/A&P                           Patient with pneumonia who placed on Augmentin and Zithromax Final Clinical Impression(s) / ED Diagnoses Final diagnoses:  Community acquired pneumonia of left lower lobe of lung    Rx / DC Orders ED Discharge Orders          Ordered    amoxicillin-clavulanate (AUGMENTIN) 875-125 MG tablet  2 times daily        08/12/21 1701    azithromycin (ZITHROMAX Z-PAK) 250 MG tablet        08/12/21 1701             Milton Ferguson, MD 08/15/21 1306

## 2021-08-13 ENCOUNTER — Other Ambulatory Visit: Payer: Self-pay

## 2021-08-13 ENCOUNTER — Ambulatory Visit (INDEPENDENT_AMBULATORY_CARE_PROVIDER_SITE_OTHER): Payer: Medicare HMO | Admitting: Family Medicine

## 2021-08-13 VITALS — BP 167/70 | HR 79 | Temp 99.5°F | Ht 67.0 in | Wt 129.0 lb

## 2021-08-13 DIAGNOSIS — J189 Pneumonia, unspecified organism: Secondary | ICD-10-CM | POA: Diagnosis not present

## 2021-08-13 MED ORDER — ALBUTEROL SULFATE HFA 108 (90 BASE) MCG/ACT IN AERS: 2.0000 | INHALATION_SPRAY | RESPIRATORY_TRACT | 2 refills | Status: AC | PRN

## 2021-08-13 NOTE — Progress Notes (Signed)
   Subjective:    Patient ID: BRISTON LAX, male    DOB: [birth year], 80 y.o.   MRN: [id number]  HPI Pneumonia follow up ER visit on Doxycycline- c/o cough and sob  He relates he is having frequent cough Denies high sweats or chills States some tightness in the lungs   Review of Systems     Objective:   Physical Exam  General-in no acute distress Eyes-no discharge Lungs-respiratory rate normal, crackles right base CV-no murmurs,RRR Extremities skin warm dry no edema Neuro grossly normal Behavior normal, alert       Assessment & Plan:  Bilateral pneumonia worse on the left than the right O2 saturation 100% Continue Augmentin to finish out azithromycin recheck on Monday recommend strongly for him to fit in eating and drinking follow-up labs and x-ray in several weeks follow-up on Monday warning signs discussed follow-up if worse

## 2021-08-17 ENCOUNTER — Other Ambulatory Visit: Payer: Self-pay

## 2021-08-17 ENCOUNTER — Ambulatory Visit (INDEPENDENT_AMBULATORY_CARE_PROVIDER_SITE_OTHER): Payer: Medicare HMO | Admitting: Family Medicine

## 2021-08-17 VITALS — BP 132/78 | HR 89 | Temp 98.6°F | Ht 67.0 in | Wt 126.0 lb

## 2021-08-17 DIAGNOSIS — J189 Pneumonia, unspecified organism: Secondary | ICD-10-CM

## 2021-08-17 NOTE — Progress Notes (Signed)
   Subjective:    Patient ID: Jason Spence, male    DOB: September 05, 1941, 80 y.o.   MRN: [id number]  HPI PNEUMONIA FOLLOW UP  Patient overall still having some coughing denies any wheezing or shortness of breath.  Able to do some walking.  Denies high fever sweats chills.  No vomiting or diarrhea finishing up his medications  Results for orders placed or performed during the hospital encounter of 08/12/21  Resp Panel by RT-PCR (Flu A&B, Covid) Nasopharyngeal Swab   Specimen: Nasopharyngeal Swab; Nasopharyngeal(NP) swabs in vial transport medium  Result Value Ref Range   SARS Coronavirus 2 by RT PCR NEGATIVE NEGATIVE   Influenza A by PCR NEGATIVE NEGATIVE   Influenza B by PCR NEGATIVE NEGATIVE  I did review his cholesterol profile looks good PSA normal at 3.8 I do not recommend repeating again Kidney function electrolytes look good Liver function looks good   Review of Systems     Objective:   Physical Exam  General-in no acute distress Eyes-no discharge Lungs-respiratory rate normal, CTA CV-no murmurs,RRR Extremities skin warm dry no edema Neuro grossly normal Behavior normal, alert       Assessment & Plan:  Pneumonia Resolving Finish out the antibiotics No new medicines recommended Follow-up in several weeks At that time we will do a follow-up chest x-ray soon thereafter if Follow-up sooner problems  Has annual wellness encounter coming up tomorrow

## 2021-08-18 ENCOUNTER — Ambulatory Visit (INDEPENDENT_AMBULATORY_CARE_PROVIDER_SITE_OTHER): Payer: Medicare HMO

## 2021-08-18 VITALS — Wt 126.0 lb

## 2021-08-18 DIAGNOSIS — Z0000 Encounter for general adult medical examination without abnormal findings: Secondary | ICD-10-CM

## 2021-08-18 NOTE — Patient Instructions (Signed)
Jason Spence , Thank you for taking time to come for your Medicare Wellness Visit. I appreciate your ongoing commitment to your health goals. Please review the following plan we discussed and let me know if I can assist you in the future.   Screening recommendations/referrals: Colonoscopy: Done 12/25/2012. No longer required due to age.   Recommended yearly ophthalmology/optometry visit for glaucoma screening and checkup Recommended yearly dental visit for hygiene and checkup  Vaccinations: Influenza vaccine: Due. Repeat annually  Pneumococcal vaccine: Done 07/18/2005 and 07/15/2014 Tdap vaccine: Done 10/18/2005 Repeat in 10 years  Shingles vaccine: Shingrix discussed. Please contact your pharmacy for coverage information.      Covid-19: Done 10/25/2019 and 11/29/2019  Advanced directives: Please bring a copy of your health care power of attorney and living will to the office to be added to your chart at your convenience.   Conditions/risks identified: Aim for 30 minutes of exercise each day, drink 6-8 glasses of water and eat lots of fruits and vegetables.   Next appointment: Follow up in one year for your annual wellness visit. 2023.  Preventive Care 77 Years and Older, Male  Preventive care refers to lifestyle choices and visits with your health care provider that can promote health and wellness. What does preventive care include? A yearly physical exam. This is also called an annual well check. Dental exams once or twice a year. Routine eye exams. Ask your health care provider how often you should have your eyes checked. Personal lifestyle choices, including: Daily care of your teeth and gums. Regular physical activity. Eating a healthy diet. Avoiding tobacco and drug use. Limiting alcohol use. Practicing safe sex. Taking low doses of aspirin every day. Taking vitamin and mineral supplements as recommended by your health care provider. What happens during an annual well  check? The services and screenings done by your health care provider during your annual well check will depend on your age, overall health, lifestyle risk factors, and family history of disease. Counseling  Your health care provider may ask you questions about your: Alcohol use. Tobacco use. Drug use. Emotional well-being. Home and relationship well-being. Sexual activity. Eating habits. History of falls. Memory and ability to understand (cognition). Work and work Statistician. Screening  You may have the following tests or measurements: Height, weight, and BMI. Blood pressure. Lipid and cholesterol levels. These may be checked every 5 years, or more frequently if you are over 3 years old. Skin check. Lung cancer screening. You may have this screening every year starting at age 88 if you have a 30-pack-year history of smoking and currently smoke or have quit within the past 15 years. Fecal occult blood test (FOBT) of the stool. You may have this test every year starting at age 20. Flexible sigmoidoscopy or colonoscopy. You may have a sigmoidoscopy every 5 years or a colonoscopy every 10 years starting at age 75. Prostate cancer screening. Recommendations will vary depending on your family history and other risks. Hepatitis C blood test. Hepatitis B blood test. Sexually transmitted disease (STD) testing. Diabetes screening. This is done by checking your blood sugar (glucose) after you have not eaten for a while (fasting). You may have this done every 1-3 years. Abdominal aortic aneurysm (AAA) screening. You may need this if you are a current or former smoker. Osteoporosis. You may be screened starting at age 76 if you are at high risk. Talk with your health care provider about your test results, treatment options, and if necessary, the need for  more tests. Vaccines  Your health care provider may recommend certain vaccines, such as: Influenza vaccine. This is recommended every  year. Tetanus, diphtheria, and acellular pertussis (Tdap, Td) vaccine. You may need a Td booster every 10 years. Zoster vaccine. You may need this after age 5. Pneumococcal 13-valent conjugate (PCV13) vaccine. One dose is recommended after age 41. Pneumococcal polysaccharide (PPSV23) vaccine. One dose is recommended after age 71. Talk to your health care provider about which screenings and vaccines you need and how often you need them. This information is not intended to replace advice given to you by your health care provider. Make sure you discuss any questions you have with your health care provider. Document Released: 10/31/2015 Document Revised: 06/23/2016 Document Reviewed: 08/05/2015 Elsevier Interactive Patient Education  2017 [place] Prevention in the Home Falls can cause injuries. They can happen to people of all ages. There are many things you can do to make your home safe and to help prevent falls. What can I do on the outside of my home? Regularly fix the edges of walkways and driveways and fix any cracks. Remove anything that might make you trip as you walk through a door, such as a raised step or threshold. Trim any bushes or trees on the path to your home. Use bright outdoor lighting. Clear any walking paths of anything that might make someone trip, such as rocks or tools. Regularly check to see if handrails are loose or broken. Make sure that both sides of any steps have handrails. Any raised decks and porches should have guardrails on the edges. Have any leaves, snow, or ice cleared regularly. Use sand or salt on walking paths during winter. Clean up any spills in your garage right away. This includes oil or grease spills. What can I do in the bathroom? Use night lights. Install grab bars by the toilet and in the tub and shower. Do not use towel bars as grab bars. Use non-skid mats or decals in the tub or shower. If you need to sit down in the shower, use a  plastic, non-slip stool. Keep the floor dry. Clean up any water that spills on the floor as soon as it happens. Remove soap buildup in the tub or shower regularly. Attach bath mats securely with double-sided non-slip rug tape. Do not have throw rugs and other things on the floor that can make you trip. What can I do in the bedroom? Use night lights. Make sure that you have a light by your bed that is easy to reach. Do not use any sheets or blankets that are too big for your bed. They should not hang down onto the floor. Have a firm chair that has side arms. You can use this for support while you get dressed. Do not have throw rugs and other things on the floor that can make you trip. What can I do in the kitchen? Clean up any spills right away. Avoid walking on wet floors. Keep items that you use a lot in easy-to-reach places. If you need to reach something above you, use a strong step stool that has a grab bar. Keep electrical cords out of the way. Do not use floor polish or wax that makes floors slippery. If you must use wax, use non-skid floor wax. Do not have throw rugs and other things on the floor that can make you trip. What can I do with my stairs? Do not leave any items on the stairs. Make  sure that there are handrails on both sides of the stairs and use them. Fix handrails that are broken or loose. Make sure that handrails are as long as the stairways. Check any carpeting to make sure that it is firmly attached to the stairs. Fix any carpet that is loose or worn. Avoid having throw rugs at the top or bottom of the stairs. If you do have throw rugs, attach them to the floor with carpet tape. Make sure that you have a light switch at the top of the stairs and the bottom of the stairs. If you do not have them, ask someone to add them for you. What else can I do to help prevent falls? Wear shoes that: Do not have high heels. Have rubber bottoms. Are comfortable and fit you  well. Are closed at the toe. Do not wear sandals. If you use a stepladder: Make sure that it is fully opened. Do not climb a closed stepladder. Make sure that both sides of the stepladder are locked into place. Ask someone to hold it for you, if possible. Clearly mark and make sure that you can see: Any grab bars or handrails. First and last steps. Where the edge of each step is. Use tools that help you move around (mobility aids) if they are needed. These include: Canes. Walkers. Scooters. Crutches. Turn on the lights when you go into a dark area. Replace any light bulbs as soon as they burn out. Set up your furniture so you have a clear path. Avoid moving your furniture around. If any of your floors are uneven, fix them. If there are any pets around you, be aware of where they are. Review your medicines with your doctor. Some medicines can make you feel dizzy. This can increase your chance of falling. Ask your doctor what other things that you can do to help prevent falls. This information is not intended to replace advice given to you by your health care provider. Make sure you discuss any questions you have with your health care provider. Document Released: 07/31/2009 Document Revised: 03/11/2016 Document Reviewed: 11/08/2014 Elsevier Interactive Patient Education  2017 [employer].

## 2021-08-18 NOTE — Progress Notes (Signed)
Subjective:   Jason Spence is a 80 y.o. male who presents for Medicare Annual/Subsequent preventive examination. Virtual Visit via Telephone Note  I connected with  Jason Spence on 08/18/21 at  3:40 PM EDT by telephone and verified that I am speaking with the correct person using two identifiers.  Location: Patient: Home Provider: [facility] Persons participating in the virtual visit: patient/Nurse Health Advisor   I discussed the limitations, risks, security and privacy concerns of performing an evaluation and management service by telephone and the availability of in person appointments. The patient expressed understanding and agreed to proceed.  Interactive audio and video telecommunications were attempted between this nurse and patient, however failed, due to patient having technical difficulties OR patient did not have access to video capability.  We continued and completed visit with audio only.  Some vital signs may be absent or patient reported.   Chriss Driver, LPN  Review of Systems     Cardiac Risk Factors include: advanced age (>54men, >39 women);hypertension;sedentary lifestyle;male gender     Objective:    Today's Vitals   08/18/21 1508 08/18/21 1509  Weight: 126 lb (57.2 kg)   PainSc:  3    Body mass index is 19.73 kg/m.  Advanced Directives 08/18/2021 02/27/2018 03/22/2015 01/05/2013 08/03/2012  Does Patient Have a Medical Advance Directive? Yes Yes No Patient does not have advance directive;Patient would not like information Patient does not have advance directive;Patient would not like information  Type of Scientist, forensic Power of [place];Living will [place];Living will - - -  Copy of [place] in Chart? No - copy requested No - copy requested - - -  Pre-existing out of facility DNR order (yellow form or pink MOST form) - - - - No    Current Medications (verified) Outpatient Encounter  Medications as of 08/18/2021  Medication Sig   albuterol (VENTOLIN HFA) 108 (90 Base) MCG/ACT inhaler Inhale 2 puffs into the lungs every 4 (four) hours as needed for wheezing.   amLODipine (NORVASC) 2.5 MG tablet TAKE 1 TABLET BY MOUTH ONCE DAILY - NEW DOSE   amoxicillin (AMOXIL) 500 MG capsule Take 500 mg by mouth every 12 (twelve) hours.   amoxicillin-clavulanate (AUGMENTIN) 875-125 MG tablet Take 1 tablet by mouth 2 (two) times daily. One po bid x 7 days   Ascorbic Acid (VITAMIN C) 1000 MG tablet Take 1,000 mg by mouth daily.   aspirin 81 MG tablet Take 1 tablet (81 mg total) by mouth daily.   azithromycin (ZITHROMAX Z-PAK) 250 MG tablet Take 2 tablets the first day then 1 tablet each day after   benzonatate (TESSALON) 200 MG capsule Take 200 mg by mouth 3 (three) times daily as needed.   ciprofloxacin (CILOXAN) 0.3 % ophthalmic solution Instill 3 drops into the left ear twice daily for 2 weeks.   dexamethasone (DECADRON) 0.1 % ophthalmic solution Instill 2 drops into left ear twice daily for 2 weeks.   fish oil-omega-3 fatty acids 1000 MG capsule Take 1 g by mouth daily.    fluticasone (FLONASE) 50 MCG/ACT nasal spray USE 2 SPRAY(S) IN EACH NOSTRIL ONCE DAILY   ketoconazole (NIZORAL) 2 % cream APPLY TOPICALLY TO AFFECTED AREA TWICE DAILY AS NEEDED   Multiple Vitamins-Minerals (OCUVITE PRESERVISION PO) Take by mouth.   Multiple Vitamins-Minerals (PRESERVISION AREDS 2 PO) Take 1 capsule by mouth 2 (two) times daily.   mupirocin ointment (BACTROBAN) 2 % Apply a small amount twice daily  inside left nostril for 14 days.   pantoprazole (PROTONIX) 40 MG tablet Take 1 tablet (40 mg total) by mouth daily. Take 30 min before breakfast   pravastatin (PRAVACHOL) 40 MG tablet Take 1 tablet by mouth once daily   ramipril (ALTACE) 10 MG capsule Take 1 capsule (10 mg total) by mouth daily.   No facility-administered encounter medications on file as of 08/18/2021.    Allergies (verified) Patient has no  known allergies.   History: Past Medical History:  Diagnosis Date   Anal stricture 2008   Arthritis    Hypercholesteremia    Hypertension    Myocardial infarct [facility])    Past Surgical History:  Procedure Laterality Date   BALLOON DILATION N/A 01/05/2013   Procedure: BALLOON DILATION;  Surgeon: Rogene Houston, MD;  [facility];  Service: Endoscopy;  Laterality: N/A;   BIOPSY  02/27/2018   Procedure: BIOPSY;  Surgeon: Rogene Houston, MD;  [facility];  Service: Endoscopy;;  esophagus   COLONOSCOPY WITH ESOPHAGOGASTRODUODENOSCOPY (EGD) N/A 01/05/2013   Procedure: COLONOSCOPY WITH ESOPHAGOGASTRODUODENOSCOPY (EGD);  Surgeon: Rogene Houston, MD;  [facility];  Service: Endoscopy;  Laterality: N/A;  100   CORONARY ARTERY BYPASS GRAFT     ESOPHAGEAL DILATION N/A 02/27/2018   Procedure: ESOPHAGEAL DILATION;  Surgeon: Rogene Houston, MD;  [facility];  Service: Endoscopy;  Laterality: N/A;   ESOPHAGOGASTRODUODENOSCOPY N/A 02/27/2018   Procedure: ESOPHAGOGASTRODUODENOSCOPY (EGD);  Surgeon: Rogene Houston, MD;  [facility];  Service: Endoscopy;  Laterality: N/A;  12:25   HERNIA REPAIR     Family History  Problem Relation Age of Onset   Hypertension Sister    Social History   Socioeconomic History   Marital status: Widowed    Spouse name: Not on file   Number of children: Not on file   Years of education: Not on file   Highest education level: Not on file  Occupational History   Not on file  Tobacco Use   Smoking status: Former    Packs/day: 0.50    Types: Cigarettes    Quit date: 08/04/1995    Years since quitting: 26.0   Smokeless tobacco: Never  Vaping Use   Vaping Use: Never used  Substance and Sexual Activity   Alcohol use: No   Drug use: Never   Sexual activity: Not on file  Other Topics Concern   Not on file  Social History Narrative   ** Merged History Encounter **       Social Determinants of Health    Financial Resource Strain: Low Risk    Difficulty of Paying Living Expenses: Not hard at all  Food Insecurity: No Food Insecurity   Worried About Charity fundraiser in the Last Year: Never true   [place] in the Last Year: Never true  Transportation Needs: No Transportation Needs   Lack of Transportation (Medical): No   Lack of Transportation (Non-Medical): No  Physical Activity: Insufficiently Active   Days of Exercise per Week: 5 days   Minutes of Exercise per Session: 20 min  Stress: No Stress Concern Present   Feeling of Stress : Not at all  Social Connections: Moderately Integrated   Frequency of Communication with Friends and Family: More than three times a week   Frequency of Social Gatherings with Friends and Family: More than three times a week   Attends Religious Services: More than 4 times per year  Active Member of Clubs or Organizations: Yes   Attends Archivist Meetings: More than 4 times per year   Marital Status: Widowed    Tobacco Counseling Counseling given: Not Answered   Clinical Intake:  Pre-visit preparation completed: Yes  Pain : No/denies pain Pain Score: 3  Pain Location: Back Pain Descriptors / Indicators: Sore     BMI - recorded: 19.73 Nutritional Status: BMI of 19-24  Normal Nutritional Risks: None Diabetes: No  How often do you need to have someone help you when you read instructions, pamphlets, or other written materials from your doctor or pharmacy?: 1 - Never  Diabetic?no  Interpreter Needed?: No  Information entered by :: MJ Druanne Bosques, LPN   Activities of Daily Living In your present state of health, do you have any difficulty performing the following activities: 08/18/2021  Hearing? N  Vision? Y  Comment Macular Degeneration  Difficulty concentrating or making decisions? Y  Comment Forgetful per pt.  Walking or climbing stairs? Y  Dressing or bathing? N  Doing errands, shopping? Y  Comment Son or  daughter in law drive patient.  Preparing Food and eating ? Y  Using the Toilet? N  In the past six months, have you accidently leaked urine? N  Do you have problems with loss of bowel control? N  Managing your Medications? Y  Managing your Finances? Y  Housekeeping or managing your Housekeeping? Y  Some recent data might be hidden    Patient Care Team: Kathyrn Drown, MD as PCP - General (Family Medicine)  Indicate any recent Medical Services you may have received from other than [facility] providers in the past year (date may be approximate).     Assessment:   This is a routine wellness examination for [facility].  Hearing/Vision screen Hearing Screening - Comments:: Hearing issues.  Vision Screening - Comments:: Glasses. Macular Degeneration. Dr. Baird Cancer.  Dietary issues and exercise activities discussed: Current Exercise Habits: Home exercise routine, Type of exercise: walking, Time (Minutes): 20, Frequency (Times/Week): 3, Weekly Exercise (Minutes/Week): 60, Intensity: Mild, Exercise limited by: cardiac condition(s);Other - see comments   Goals Addressed             This Visit's Progress    Have 3 meals a day         Depression Screen PHQ 2/9 Scores 08/18/2021 08/17/2021 06/08/2021 02/04/2021 08/08/2020 01/31/2018 07/01/2015  PHQ - 2 Score 0 0 0 0 0 1 0    Fall Risk Fall Risk  08/18/2021 08/17/2021 06/08/2021 02/04/2021 08/08/2020  Falls in the past year? 0 0 0 0 0  Number falls in past yr: 0 0 0 0 -  Injury with Fall? 0 0 0 0 -  Risk for fall due to : Impaired vision;Impaired mobility No Fall Risks No Fall Risks - -  Follow up Falls prevention discussed Falls evaluation completed Falls evaluation completed Falls evaluation completed Falls evaluation completed    [place]:  Any stairs in or around the home? Yes  If so, are there any without handrails? No  Home free of loose throw rugs in walkways, pet beds, electrical cords, etc? Yes   Adequate lighting in your home to reduce risk of falls? Yes   ASSISTIVE DEVICES UTILIZED TO PREVENT FALLS:  Life alert? No  Use of a cane, Santa or w/c? Yes  Grab bars in the bathroom? Yes  Shower chair or bench in shower? Yes  Elevated toilet seat or a handicapped toilet?  Yes   TIMED UP AND GO:  Was the test performed? No . Phone visit.   Cognitive Function: Normal cognitive status assessed by direct observation by this Nurse Health Advisor. No abnormalities found.          Immunizations Immunization History  Administered Date(s) Administered   Fluad Quad(high Dose 65+) 08/08/2020   Influenza,inj,Quad PF,6+ Mos 07/12/2013, 07/15/2014, 07/01/2015, 08/03/2017, 06/20/2018, 08/08/2019   Influenza-Unspecified 07/18/2012, 08/19/2016   Moderna Sars-Covid-2 Vaccination 10/25/2019, 11/29/2019   Pneumococcal Conjugate-13 07/15/2014   Pneumococcal Polysaccharide-23 07/18/2005   Td 10/19/2011   Zoster, Live 10/18/2005    TDAP status: Due, Education has been provided regarding the importance of this vaccine. Advised may receive this vaccine at local pharmacy or Health Dept. Aware to provide a copy of the vaccination record if obtained from local pharmacy or Health Dept. Verbalized acceptance and understanding.  Flu Vaccine status: Due, Education has been provided regarding the importance of this vaccine. Advised may receive this vaccine at local pharmacy or Health Dept. Aware to provide a copy of the vaccination record if obtained from local pharmacy or Health Dept. Verbalized acceptance and understanding.  Pneumococcal vaccine status: Up to date  Covid-19 vaccine status: Information provided on how to obtain vaccines.   Qualifies for Shingles Vaccine? No   Zostavax completed Yes   Shingrix Completed?: No.    Education has been provided regarding the importance of this vaccine. Patient has been advised to call insurance company to determine out of pocket expense if they have not  yet received this vaccine. Advised may also receive vaccine at local pharmacy or Health Dept. Verbalized acceptance and understanding.  Screening Tests Health Maintenance  Topic Date Due   Zoster Vaccines- Shingrix (1 of 2) Never done   COVID-19 Vaccine (3 - Moderna risk series) 12/27/2019   INFLUENZA VACCINE  05/18/2021   TETANUS/TDAP  10/18/2021   Pneumonia Vaccine 48+ Years old  Completed   HPV VACCINES  Aged Out    Health Maintenance  Health Maintenance Due  Topic Date Due   Zoster Vaccines- Shingrix (1 of 2) Never done   COVID-19 Vaccine (3 - Moderna risk series) 12/27/2019   INFLUENZA VACCINE  05/18/2021    Colorectal cancer screening: Type of screening: Colonoscopy. Completed 12/25/2012. Repeat every NO LONGER REQUIRED DUE TO AGE years  Lung Cancer Screening: (Low Dose CT Chest recommended if Age 7-80 years, 30 pack-year currently smoking OR have quit w/in 15years.) does not qualify.     Additional Screening:  Hepatitis C Screening: does not qualify.  Vision Screening: Recommended annual ophthalmology exams for early detection of glaucoma and other disorders of the eye. Is the patient up to date with their annual eye exam?  Yes  Who is the provider or what is the name of the office in which the patient attends annual eye exams? Dr. Baird Cancer If pt is not established with a provider, would they like to be referred to a provider to establish care? No .   Dental Screening: Recommended annual dental exams for proper oral hygiene  Community Resource Referral / Chronic Care Management: CRR required this visit?  No   CCM required this visit?  No      Plan:     I have personally reviewed and noted the following in the patient's chart:   Medical and social history Use of alcohol, tobacco or illicit drugs  Current medications and supplements including opioid prescriptions. Patient is not currently taking opioid prescriptions. Functional ability and status Nutritional  status Physical activity Advanced directives List of other physicians Hospitalizations, surgeries, and ER visits in previous 12 months Vitals Screenings to include cognitive, depression, and falls Referrals and appointments  In addition, I have reviewed and discussed with patient certain preventive protocols, quality metrics, and best practice recommendations. A written personalized care plan for preventive services as well as general preventive health recommendations were provided to patient.     Chriss Driver, LPN   22/02/[id number]   Nurse Notes: Pt recovering from pneumonia. Pt states he is doing well other than cough. Pt up to date on health maintenance. Pt plans to get flu vaccine at next visit. Discussed Shingles vaccine.

## 2021-08-27 ENCOUNTER — Encounter: Payer: Medicare HMO | Admitting: Family Medicine

## 2021-09-07 ENCOUNTER — Other Ambulatory Visit (INDEPENDENT_AMBULATORY_CARE_PROVIDER_SITE_OTHER): Payer: Self-pay | Admitting: *Deleted

## 2021-09-07 MED ORDER — PANTOPRAZOLE SODIUM 40 MG PO TBEC: 40.0000 mg | DELAYED_RELEASE_TABLET | Freq: Every day | ORAL | 0 refills | Status: DC

## 2021-09-14 ENCOUNTER — Telehealth: Payer: Self-pay | Admitting: Family Medicine

## 2021-09-14 DIAGNOSIS — J189 Pneumonia, unspecified organism: Secondary | ICD-10-CM

## 2021-09-14 NOTE — Telephone Encounter (Signed)
May keep appointment Based on advice from radiologist it is recommended to wait until 6 to 8 weeks after the first x-ray so therefore we will not do the follow-up x-ray until mid December But I would recommend for him to follow-up on the 30th so we can listen to his lungs Go ahead and put in the order for the follow-up x-ray Thank you

## 2021-09-14 NOTE — Telephone Encounter (Signed)
Patient advised per Dr Nicki Reaper: May keep appointment Based on advice from radiologist it is recommended to wait until 6 to 8 weeks after the first x-ray so therefore we will not do the follow-up x-ray until mid December But Dr Nicki Reaper would recommend for him to follow-up on the 30th so we can listen to his lungs Go ahead and put in the order for the follow-up x-ray  Patient verbalized understanding and CXR ordered in [facility]

## 2021-09-14 NOTE — Telephone Encounter (Signed)
Patient has appointment on 11/30 and wanting to know if he needed to do chest x-ray first. Please advise

## 2021-09-16 ENCOUNTER — Ambulatory Visit (INDEPENDENT_AMBULATORY_CARE_PROVIDER_SITE_OTHER): Payer: Medicare HMO | Admitting: Family Medicine

## 2021-09-16 ENCOUNTER — Ambulatory Visit: Payer: Medicare HMO | Admitting: Family Medicine

## 2021-09-16 ENCOUNTER — Other Ambulatory Visit: Payer: Self-pay

## 2021-09-16 ENCOUNTER — Ambulatory Visit (HOSPITAL_COMMUNITY)
Admission: RE | Admit: 2021-09-16 | Discharge: 2021-09-16 | Disposition: A | Discharge status: Home / Self Care | Payer: Medicare HMO | Source: Ambulatory Visit | Attending: Family Medicine | Admitting: Family Medicine

## 2021-09-16 VITALS — BP 126/82 | Ht 67.0 in | Wt 126.4 lb

## 2021-09-16 DIAGNOSIS — R059 Cough, unspecified: Secondary | ICD-10-CM | POA: Diagnosis not present

## 2021-09-16 DIAGNOSIS — J189 Pneumonia, unspecified organism: Secondary | ICD-10-CM

## 2021-09-16 DIAGNOSIS — R0602 Shortness of breath: Secondary | ICD-10-CM | POA: Diagnosis not present

## 2021-09-16 MED ORDER — DOXYCYCLINE HYCLATE 100 MG PO TABS: 100.0000 mg | ORAL_TABLET | Freq: Two times a day (BID) | ORAL | 0 refills | Status: DC

## 2021-09-16 NOTE — Progress Notes (Signed)
   Subjective:    Patient ID: Jason Spence, male    DOB: [birth year], 80 y.o.   MRN: [id number]  HPI  Patient arrives for a follow up after recent pneumonia. Patient states he has lingering cough. Distant coughing is been ongoing States that he is not had any high fever chills States energy level low Appetite fair Does get a little winded when he tries to push himself Doing some walking Recently diagnosed with pneumonia Please see previous x-ray as well as office visit Review of Systems     Objective:   Physical Exam Crackles bilateral.  Not respiratory distress heart regular pulse normal extremities no edema skin warm dry       Assessment & Plan:  Persistent coughing Potential for infection Patient not eating well Check lab work as well as chest x-ray Addendum chest x-ray came back showing persistent markings in the lower bases could be interstitial lung disease but could also be persistent infection recommend round of antibiotics recheck in 2 weeks await lab work results may also need a follow-up CT scan if he does not clear as expected

## 2021-09-17 LAB — CBC WITH DIFFERENTIAL/PLATELET
Basophils Absolute: 0.1 10*3/uL (ref 0.0–0.2)
Basos: 1 %
EOS (ABSOLUTE): 1.4 10*3/uL — ABNORMAL HIGH (ref 0.0–0.4)
Eos: 13 %
Hematocrit: 38.1 % (ref 37.5–51.0)
Hemoglobin: 12.2 g/dL — ABNORMAL LOW (ref 13.0–17.7)
Immature Grans (Abs): 0 10*3/uL (ref 0.0–0.1)
Immature Granulocytes: 0 %
Lymphocytes Absolute: 2 10*3/uL (ref 0.7–3.1)
Lymphs: 18 %
MCH: 26.8 pg (ref 26.6–33.0)
MCHC: 32 g/dL (ref 31.5–35.7)
MCV: 84 fL (ref 79–97)
Monocytes Absolute: 1 10*3/uL — ABNORMAL HIGH (ref 0.1–0.9)
Monocytes: 9 %
Neutrophils Absolute: 6.3 10*3/uL (ref 1.4–7.0)
Neutrophils: 59 %
Platelets: 313 10*3/uL (ref 150–450)
RBC: 4.55 x10E6/uL (ref 4.14–5.80)
RDW: 12.7 % (ref 11.6–15.4)
WBC: 10.8 10*3/uL (ref 3.4–10.8)

## 2021-09-17 LAB — BASIC METABOLIC PANEL
BUN/Creatinine Ratio: 13 (ref 10–24)
BUN: 9 mg/dL (ref 8–27)
CO2: 27 mmol/L (ref 20–29)
Calcium: 8.9 mg/dL (ref 8.6–10.2)
Chloride: 99 mmol/L (ref 96–106)
Creatinine, Ser: 0.67 mg/dL — ABNORMAL LOW (ref 0.76–1.27)
Glucose: 96 mg/dL (ref 70–99)
Potassium: 4.5 mmol/L (ref 3.5–5.2)
Sodium: 137 mmol/L (ref 134–144)
eGFR: 94 mL/min/{1.73_m2} (ref >59)

## 2021-09-19 ENCOUNTER — Emergency Department (HOSPITAL_COMMUNITY)
Admission: EM | Admit: 2021-09-19 | Discharge: 2021-09-19 | Disposition: A | Discharge status: Home / Self Care | Payer: Medicare HMO | Attending: Emergency Medicine | Admitting: Emergency Medicine

## 2021-09-19 ENCOUNTER — Encounter (HOSPITAL_COMMUNITY): Payer: Self-pay | Admitting: *Deleted

## 2021-09-19 ENCOUNTER — Emergency Department (HOSPITAL_COMMUNITY): Payer: Medicare HMO

## 2021-09-19 DIAGNOSIS — Z7982 Long term (current) use of aspirin: Secondary | ICD-10-CM | POA: Insufficient documentation

## 2021-09-19 DIAGNOSIS — Z87891 Personal history of nicotine dependence: Secondary | ICD-10-CM | POA: Diagnosis not present

## 2021-09-19 DIAGNOSIS — I10 Essential (primary) hypertension: Secondary | ICD-10-CM | POA: Diagnosis not present

## 2021-09-19 DIAGNOSIS — Z20822 Contact with and (suspected) exposure to covid-19: Secondary | ICD-10-CM | POA: Diagnosis not present

## 2021-09-19 DIAGNOSIS — R531 Weakness: Secondary | ICD-10-CM | POA: Diagnosis not present

## 2021-09-19 DIAGNOSIS — R519 Headache, unspecified: Secondary | ICD-10-CM | POA: Diagnosis not present

## 2021-09-19 DIAGNOSIS — Z955 Presence of coronary angioplasty implant and graft: Secondary | ICD-10-CM | POA: Insufficient documentation

## 2021-09-19 DIAGNOSIS — Z79899 Other long term (current) drug therapy: Secondary | ICD-10-CM | POA: Insufficient documentation

## 2021-09-19 DIAGNOSIS — R42 Dizziness and giddiness: Secondary | ICD-10-CM

## 2021-09-19 DIAGNOSIS — R059 Cough, unspecified: Secondary | ICD-10-CM | POA: Insufficient documentation

## 2021-09-19 LAB — URINALYSIS, ROUTINE W REFLEX MICROSCOPIC
Bilirubin Urine: NEGATIVE
Glucose, UA: NEGATIVE mg/dL
Hgb urine dipstick: NEGATIVE
Ketones, ur: NEGATIVE mg/dL
Leukocytes,Ua: NEGATIVE
Nitrite: NEGATIVE
Protein, ur: NEGATIVE mg/dL
Specific Gravity, Urine: 1.015 (ref 1.005–1.030)
pH: 7 (ref 5.0–8.0)

## 2021-09-19 LAB — BASIC METABOLIC PANEL
Anion gap: 8 (ref 5–15)
BUN: 13 mg/dL (ref 8–23)
CO2: 28 mmol/L (ref 22–32)
Calcium: 9.1 mg/dL (ref 8.9–10.3)
Chloride: 100 mmol/L (ref 98–111)
Creatinine, Ser: 0.65 mg/dL (ref 0.61–1.24)
GFR, Estimated: >60 mL/min (ref >60)
Glucose, Bld: 179 mg/dL — ABNORMAL HIGH (ref 70–99)
Potassium: 3.6 mmol/L (ref 3.5–5.1)
Sodium: 136 mmol/L (ref 135–145)

## 2021-09-19 LAB — CBC WITH DIFFERENTIAL/PLATELET
Abs Immature Granulocytes: 0.03 10*3/uL (ref 0.00–0.07)
Basophils Absolute: 0.1 10*3/uL (ref 0.0–0.1)
Basophils Relative: 1 %
Eosinophils Absolute: 0.6 10*3/uL — ABNORMAL HIGH (ref 0.0–0.5)
Eosinophils Relative: 6 %
HCT: 42.2 % (ref 39.0–52.0)
Hemoglobin: 13.1 g/dL (ref 13.0–17.0)
Immature Granulocytes: 0 %
Lymphocytes Relative: 11 %
Lymphs Abs: 0.9 10*3/uL (ref 0.7–4.0)
MCH: 27.6 pg (ref 26.0–34.0)
MCHC: 31 g/dL (ref 30.0–36.0)
MCV: 89 fL (ref 80.0–100.0)
Monocytes Absolute: 0.3 10*3/uL (ref 0.1–1.0)
Monocytes Relative: 4 %
Neutro Abs: 6.9 10*3/uL (ref 1.7–7.7)
Neutrophils Relative %: 78 %
Platelets: 314 10*3/uL (ref 150–400)
RBC: 4.74 MIL/uL (ref 4.22–5.81)
RDW: 13.3 % (ref 11.5–15.5)
WBC: 8.8 10*3/uL (ref 4.0–10.5)
nRBC: 0 % (ref 0.0–0.2)

## 2021-09-19 LAB — RESP PANEL BY RT-PCR (FLU A&B, COVID) ARPGX2
Influenza A by PCR: NEGATIVE
Influenza B by PCR: NEGATIVE
SARS Coronavirus 2 by RT PCR: NEGATIVE

## 2021-09-19 MED ORDER — SODIUM CHLORIDE 0.9 % IV BOLUS
500.0000 mL | Freq: Once | INTRAVENOUS | Status: AC
  Administered 2021-09-19: 500 mL via INTRAVENOUS

## 2021-09-19 MED ORDER — MECLIZINE HCL 25 MG PO TABS: 25.0000 mg | ORAL_TABLET | Freq: Three times a day (TID) | ORAL | 0 refills | Status: AC | PRN

## 2021-09-19 NOTE — ED Notes (Signed)
Stood patient to walk around room. Pt felt dizzy upon stading and while he was able to take several steps he stated he felt very dizzy and unsteady on feet doing so.

## 2021-09-19 NOTE — ED Notes (Addendum)
Patient has arrived from [facility] ED. Patient alert and oriented and will wait in triage or lobby for MRI. MRI notified of patient arrival and patient not claustrophobic.

## 2021-09-19 NOTE — ED Triage Notes (Signed)
Dizziness onset last night

## 2021-09-19 NOTE — Discharge Instructions (Signed)
Your MRI today did not show evidence of acute stroke.  Per the previous plan, we feel you are safe for discharge home given your improvement in symptoms.  Please follow-up with your PCP for further management.  Please rest and stay hydrated.  If any symptoms change or worsen, please return to the nearest emergency department.

## 2021-09-19 NOTE — ED Provider Notes (Signed)
Patient transferred for MRI tonight to rule out a central cause of dizziness.  Previous team thought was likely vertigo but plan was to get MRI.  MRI is reassuring with no evidence of acute stroke.  Patient reports he is feeling better.  Will p.o. challenge and allowed to ambulate, anticipate discharge after this is completed.  Patient will follow-up with outpatient PCP.  Patient reports he is feeling better and does want prescription for meclizine.  We will print the prescription and he will follow-up with PCP.    Clinical Impression: 1. Dizziness     Disposition: Discharge  Condition: Good  I have discussed the results, Dx and Tx plan with the pt(& family if present). He/she/they expressed understanding and agree(s) with the plan. Discharge instructions discussed at great length. Strict return precautions discussed and pt &/or family have verbalized understanding of the instructions. No further questions at time of discharge.    New Prescriptions   MECLIZINE (ANTIVERT) 25 MG TABLET    Take 1 tablet (25 mg total) by mouth 3 (three) times daily as needed for dizziness.    Follow Up: Kathyrn Drown, MD [place] [address] [phone]     [facility] EMERGENCY DEPARTMENT [address] [id number] mc [place]             Karsten Vaughn, Gwenyth Allegra, MD 09/19/21 [phone]

## 2021-09-19 NOTE — ED Notes (Signed)
Pt to triage via [place] from [facility] for MRI.

## 2021-09-19 NOTE — ED Provider Notes (Signed)
[facility] EMERGENCY DEPARTMENT Provider Note   CSN: [id number] Arrival date & time: 09/19/21  1135     History Chief Complaint  Patient presents with   Dizziness    Jason Spence is a 80 y.o. male.   Dizziness Associated symptoms: headaches and weakness   Associated symptoms: no chest pain, no nausea and no vomiting        Jason Spence is a 80 y.o. male with past medical history of hypertension hypercholesterolemia, GERD and macular degeneration who presents to the Emergency Department complaining of weakness of both lower legs and dizziness.  Symptoms began at unknown time yesterday worse at 10:30 PM last evening.  Patient describes "I feel off balance" upon standing or walking.  States last evening he attempted to go to the bathroom and felt both legs were weak and would not hold him up.  He was seen in this emergency department on 08/12/2021 and diagnosed with pneumonia.  He was treated outpatient with Augmentin and azithromycin.  He patient completed both antibiotics, but continued to have cough and mid back pain.  He was seen for follow-up visit with his PCP on 09/14/2021 had outpatient chest x-ray and blood work and was started on doxycycline.  Patient has had 3 doses of this medication so far.  His symptoms have also been associated with mild frontal headache, no neck pain or stiffness.  Headache gradual in onset.  No visual changes or unilateral weakness.  He continues to have cough without shortness of breath or chest pain.  No abdominal pain nausea or vomiting.    Past Medical History:  Diagnosis Date   Anal stricture 2008   Arthritis    Hypercholesteremia    Hypertension    Myocardial infarct [facility])     Patient Active Problem List   Diagnosis Date Noted   Bilateral hearing loss 07/30/2021   Aural polyp, left 07/20/2021   Legally blind 08/08/2020   Esophageal dysphagia 02/20/2018   Macular degeneration of both eyes 01/27/2015   Fibroma 09/25/2014    GERD (gastroesophageal reflux disease) 01/10/2014   Hyperlipidemia 01/10/2014   Osteoarthritis of left knee 01/10/2014   Essential hypertension, benign 03/15/2013   Other dysphagia 12/26/2012   Encounter for screening colonoscopy 12/26/2012   Fever 08/02/2012   Meningismus 08/02/2012   Headache(784.0) 08/02/2012   DEGENERATIVE JOINT DISEASE, LEFT KNEE 11/06/2007   DERANGEMENT MENISCUS 11/06/2007    Past Surgical History:  Procedure Laterality Date   BALLOON DILATION N/A 01/05/2013   Procedure: BALLOON DILATION;  Surgeon: Rogene Houston, MD;  Location: [facility] ENDO SUITE;  Service: Endoscopy;  Laterality: N/A;   BIOPSY  02/27/2018   Procedure: BIOPSY;  Surgeon: Rogene Houston, MD;  Location: [facility] ENDO SUITE;  Service: Endoscopy;;  esophagus   COLONOSCOPY WITH ESOPHAGOGASTRODUODENOSCOPY (EGD) N/A 01/05/2013   Procedure: COLONOSCOPY WITH ESOPHAGOGASTRODUODENOSCOPY (EGD);  Surgeon: Rogene Houston, MD;  Location: [facility] ENDO SUITE;  Service: Endoscopy;  Laterality: N/A;  100   CORONARY ARTERY BYPASS GRAFT     ESOPHAGEAL DILATION N/A 02/27/2018   Procedure: ESOPHAGEAL DILATION;  Surgeon: Rogene Houston, MD;  Location: [facility] ENDO SUITE;  Service: Endoscopy;  Laterality: N/A;   ESOPHAGOGASTRODUODENOSCOPY N/A 02/27/2018   Procedure: ESOPHAGOGASTRODUODENOSCOPY (EGD);  Surgeon: Rogene Houston, MD;  Location: [facility] ENDO SUITE;  Service: Endoscopy;  Laterality: N/A;  12:25   HERNIA REPAIR         Family History  Problem Relation Age of Onset   Hypertension Sister  Social History   Tobacco Use   Smoking status: Former    Packs/day: 0.50    Types: Cigarettes    Quit date: 08/04/1995    Years since quitting: 26.1   Smokeless tobacco: Never  Vaping Use   Vaping Use: Never used  Substance Use Topics   Alcohol use: No   Drug use: Never    Home Medications Prior to Admission medications   Medication Sig Start Date End Date Taking? Authorizing Provider  albuterol (VENTOLIN HFA) 108 (90 Base)  MCG/ACT inhaler Inhale 2 puffs into the lungs every 4 (four) hours as needed for wheezing. 08/13/21   Kathyrn Drown, MD  amLODipine (NORVASC) 2.5 MG tablet TAKE 1 TABLET BY MOUTH ONCE DAILY - NEW DOSE 02/04/21   Kathyrn Drown, MD  Ascorbic Acid (VITAMIN C) 1000 MG tablet Take 1,000 mg by mouth daily.    [provider]  aspirin 81 MG tablet Take 1 tablet (81 mg total) by mouth daily. 02/28/18   Rehman, Mechele Dawley, MD  benzonatate (TESSALON) 200 MG capsule Take 200 mg by mouth 3 (three) times daily as needed. 04/10/21   [provider]  ciprofloxacin (CILOXAN) 0.3 % ophthalmic solution Instill 3 drops into the left ear twice daily for 2 weeks. 07/20/21   [provider]  dexamethasone (DECADRON) 0.1 % ophthalmic solution Instill 2 drops into left ear twice daily for 2 weeks. 07/20/21   [provider]  doxycycline (VIBRA-TABS) 100 MG tablet Take 1 tablet (100 mg total) by mouth 2 (two) times daily. With snack and tall glass of water 09/16/21   Kathyrn Drown, MD  fish oil-omega-3 fatty acids 1000 MG capsule Take 1 g by mouth daily.     [provider]  fluticasone (FLONASE) 50 MCG/ACT nasal spray USE 2 SPRAY(S) IN EACH NOSTRIL ONCE DAILY 01/27/18   Kathyrn Drown, MD  ketoconazole (NIZORAL) 2 % cream APPLY TOPICALLY TO AFFECTED AREA TWICE DAILY AS NEEDED 09/17/20   Elvia Collum M, DO  Multiple Vitamins-Minerals (OCUVITE PRESERVISION PO) Take by mouth.    [provider]  Multiple Vitamins-Minerals (PRESERVISION AREDS 2 PO) Take 1 capsule by mouth 2 (two) times daily.    [provider]  mupirocin ointment (BACTROBAN) 2 % Apply a small amount twice daily inside left nostril for 14 days. 07/20/21   [provider]  pantoprazole (PROTONIX) 40 MG tablet Take 1 tablet (40 mg total) by mouth daily. Take 30 min before breakfast. NEEDS OFFICE VISIT 09/07/21   Rogene Houston, MD  pravastatin (PRAVACHOL) 40 MG tablet Take 1 tablet by mouth  once daily 08/10/21   Kathyrn Drown, MD  ramipril (ALTACE) 10 MG capsule Take 1 capsule (10 mg total) by mouth daily. 02/04/21   Kathyrn Drown, MD    Allergies    Patient has no known allergies.  Review of Systems   Review of Systems  Constitutional:  Negative for chills, fatigue and fever.  HENT:  Negative for congestion and trouble swallowing.   Eyes:  Negative for pain and visual disturbance.  Respiratory:  Negative for cough and wheezing.   Cardiovascular:  Negative for chest pain.  Gastrointestinal:  Negative for abdominal pain, nausea and vomiting.  Genitourinary:  Negative for dysuria and flank pain.  Musculoskeletal:  Negative for arthralgias, back pain, myalgias, neck pain and neck stiffness.  Skin:  Negative for rash.  Neurological:  Positive for dizziness, weakness and headaches. Negative for syncope and numbness.  Hematological:  Does not bruise/bleed easily.  All other systems reviewed and are negative.  Physical Exam Updated Vital Signs BP (!) 165/70 (BP Location: Right Arm)   Pulse 84   Temp 98.3 F (36.8 C) (Oral)   Resp 16   Ht 5\' 7"  (1.702 m)   Wt 57.3 kg   SpO2 100%   BMI 19.80 kg/m   Physical Exam Vitals and nursing note reviewed.  Constitutional:      General: He is not in acute distress.    Appearance: Normal appearance. He is not toxic-appearing.  HENT:     Right Ear: Tympanic membrane and ear canal normal.     Left Ear: Tympanic membrane and ear canal normal.     Mouth/Throat:     Mouth: Mucous membranes are moist.     Pharynx: Oropharynx is clear.  Eyes:     Extraocular Movements: Extraocular movements intact.     Conjunctiva/sclera: Conjunctivae normal.     Pupils: Pupils are equal, round, and reactive to light.  Cardiovascular:     Rate and Rhythm: Normal rate and regular rhythm.     Pulses: Normal pulses.  Pulmonary:     Effort: Pulmonary effort is normal.     Comments: Scattered rhonchi.  No rales or wheezes.  No increased work of  breathing. Musculoskeletal:     Cervical back: Normal range of motion. No rigidity or tenderness.     Right lower leg: No edema.     Left lower leg: No edema.  Skin:    General: Skin is warm.     Capillary Refill: Capillary refill takes less than 2 seconds.  Neurological:     General: No focal deficit present.     Mental Status: He is alert.     Sensory: Sensation is intact. No sensory deficit.     Motor: Motor function is intact. No weakness or pronator drift.     Coordination: Coordination is intact.     Comments: CN II through XII intact.  Speech clear.  No pronator drift.  No facial droop    ED Results / Procedures / Treatments   Labs (all labs ordered are listed, but only abnormal results are displayed) Labs Reviewed  BASIC METABOLIC PANEL - Abnormal; Notable for the following components:      Result Value   Glucose, Bld 179 (*)    All other components within normal limits  URINALYSIS, ROUTINE W REFLEX MICROSCOPIC - Abnormal; Notable for the following components:   APPearance HAZY (*)    All other components within normal limits  CBC WITH DIFFERENTIAL/PLATELET - Abnormal; Notable for the following components:   Eosinophils Absolute 0.6 (*)    All other components within normal limits  RESP PANEL BY RT-PCR (FLU A&B, COVID) [id number]  CBG MONITORING, ED    EKG EKG Interpretation  Date/Time:  Saturday September 19 2021 12:10:43 EST Ventricular Rate:  78 PR Interval:  151 QRS Duration: 108 QT Interval:  443 QTC Calculation: 505 R Axis:   5 Text Interpretation: Sinus rhythm Borderline T abnormalities, lateral leads Prolonged QT interval No old tracing to compare Confirmed by Davonna Belling [phone]) on 09/19/2021 12:39:05 PM  Radiology DG Chest 2 View  Result Date: 09/16/2021 CLINICAL DATA:  Cough, shortness of breath. EXAM: CHEST - 2 VIEW COMPARISON:  August 12, 2021. FINDINGS: The heart size and mediastinal contours are within normal limits. Status post coronary  bypass graft. Stable biapical scarring is noted. Diffuse interstitial densities are noted most consistent with scarring.  Acute superimposed inflammation cannot be excluded. The visualized skeletal structures are unremarkable. IMPRESSION: Stable diffuse interstitial densities are noted most consistent with scarring or chronic interstitial lung disease. Acute superimposed inflammation cannot be excluded. Aortic Atherosclerosis (ICD10-I70.0). Electronically Signed   By: Marijo Conception M.D.   On: 09/16/2021 15:10     Procedures Procedures   Medications Ordered in ED Medications  sodium chloride 0.9 % bolus 500 mL (has no administration in time range)    ED Course  I have reviewed the triage vital signs and the nursing notes.  Pertinent labs & imaging results that were available during my care of the patient were reviewed by me and considered in my medical decision making (see chart for details).    MDM Rules/Calculators/A&P                         {Remember to document critical care time when appropriate:1  Patient here accompanied by daughter.  Complaints of lower extremity weakness and dizziness since yesterday.  Seen here last month and treated with antibiotics for pneumonia.  Saw PCP 3 days ago had labs and repeat chest x-ray and was started on doxycycline.  Labs from 3 days ago unremarkable.  Chest x-ray showed stable diffuse interstitial densities most consistent with scarring or interstitial lung disease.  On exam, patient nontoxic-appearing.  Vital signs reassuring.  No hypoxia or tachycardia.  Continues to have cough without chest pain or shortness of breath.  No focal neurologic deficits on my exam.  Patient mentating well.  On review of medical records, patient had negative MRI/MRA of brain in 2019 no reported history of strokes or TIAs.  On recheck, pt resting comfortably.  Feels slightly better after IVF's.  Attempted to ambulate patient.  He continues to report dizziness and  weakness of both lower extremities upon standing leaning toward right per nursing staff. Pt Seen by Dr. Alvino Chapel and care plan discussed.    Symptoms felt to be most likely related to vertigo, but given age and persisting symptoms.  Patient needs MRI imaging.  Unavailable here at this time.  Discussed care with patient.  He is agreeable to transfer to [facility] for MRI.  Discussed care plan with Dr. Vanita Panda at [facility].  He is agreeable for patient to be transferred to [facility] for MRI brain to rule out acute process.   Final Clinical Impression(s) / ED Diagnoses Final diagnoses:  Dizziness    Rx / DC Orders ED Discharge Orders     None        Bufford Lope 09/19/21 Ann Lions, MD 09/19/21 [phone]

## 2021-09-19 NOTE — ED Notes (Signed)
Patient verbalizes understanding of d/c instructions. Opportunities for questions and answers were provided. Pt d/c from ED to lobby via wheelchair. Daughter picking pt up

## 2021-09-24 ENCOUNTER — Telehealth: Payer: Self-pay | Admitting: *Deleted

## 2021-09-24 MED ORDER — NIRMATRELVIR/RITONAVIR (PAXLOVID)TABLET: ORAL_TABLET | ORAL | 0 refills | Status: DC

## 2021-09-24 NOTE — Telephone Encounter (Signed)
Patient calls and stated he tested positive for Covid today. Patient states he has been having problems with persistent pneumonia since mid October- just finished last antibiotic this am. Patient states he lives with his son and family and family and they have had Covid. He started feeling worse last night and woke up with sore throat and tested positive- still has cough and SOB but there has been no change in those symptoms since he was diagnosed with pneumonia   Isac Caddy  [phone]

## 2021-09-24 NOTE — Telephone Encounter (Signed)
Patient notified and advised to hold stating while on Paxlovid. Patient verbalized understanding.

## 2021-09-24 NOTE — Telephone Encounter (Signed)
Coral Spikes, DO    Rx sent for Paxlovid. Would recommend holding statin while on treatment.

## 2021-10-01 DIAGNOSIS — H903 Sensorineural hearing loss, bilateral: Secondary | ICD-10-CM | POA: Diagnosis not present

## 2021-10-06 ENCOUNTER — Other Ambulatory Visit: Payer: Self-pay | Admitting: Family Medicine

## 2021-10-07 ENCOUNTER — Encounter: Payer: Self-pay | Admitting: Family Medicine

## 2021-10-07 ENCOUNTER — Ambulatory Visit (INDEPENDENT_AMBULATORY_CARE_PROVIDER_SITE_OTHER): Payer: Medicare HMO | Admitting: Family Medicine

## 2021-10-07 ENCOUNTER — Other Ambulatory Visit: Payer: Self-pay

## 2021-10-07 VITALS — BP 138/70 | HR 78 | Temp 99.3°F | Wt 123.0 lb

## 2021-10-07 DIAGNOSIS — B349 Viral infection, unspecified: Secondary | ICD-10-CM

## 2021-10-07 NOTE — Progress Notes (Signed)
° °  Subjective:    Patient ID: Jason Spence, male    DOB: Mar 07, 1941, 80 y.o.   MRN: [id number]  HPI Pt here for follow up on pneumonia. Pt feeling ok today. Has "a little hacking cough". Going on about 6-8 weeks. Covid positive 2 weeks ago. Pt wondering if PCP is going to set up chest xray.   Patient to some degree still having some fatigue is able to walk with some intermittent coughing no high fever chills or sweats has lost a little bit of weight Review of Systems     Objective:   Physical Exam General-in no acute distress Eyes-no discharge Lungs-respiratory rate normal, CTA CV-no murmurs,RRR Extremities skin warm dry no edema Neuro grossly normal Behavior normal, alert        Assessment & Plan:  He is a relatively thin guy he is only lost a few pounds of the past 6 months we will watch this closely  Follow-up x-ray in a few weeks if this is not completely normal progress forward with a CAT scan  No sign of any any pneumonia currently

## 2021-10-13 ENCOUNTER — Other Ambulatory Visit: Payer: Self-pay

## 2021-10-13 ENCOUNTER — Telehealth: Payer: Self-pay | Admitting: Family Medicine

## 2021-10-13 ENCOUNTER — Ambulatory Visit (HOSPITAL_COMMUNITY)
Admission: RE | Admit: 2021-10-13 | Discharge: 2021-10-13 | Disposition: A | Discharge status: Home / Self Care | Payer: Medicare HMO | Source: Ambulatory Visit | Attending: Family Medicine | Admitting: Family Medicine

## 2021-10-13 DIAGNOSIS — R059 Cough, unspecified: Secondary | ICD-10-CM | POA: Diagnosis not present

## 2021-10-13 NOTE — Telephone Encounter (Signed)
Xray placed as STAT. Daughter in law made aware. Pt is not having any fever and no shortness of breath. Eating and drinking OK but does not have much of appetite due to vertigo episode Sunday night/Monday morning.

## 2021-10-13 NOTE — Telephone Encounter (Signed)
Please advise. Thank you

## 2021-10-13 NOTE — Telephone Encounter (Signed)
May go ahead and get chest x-ray this afternoon It is important to know is a patient running fever?  Shortness of breath? Able to eat and drinks okay?

## 2021-10-13 NOTE — Telephone Encounter (Signed)
DIL states pt had pneumonia about 6 weeks ago. Dr Nicki Reaper wanted him to have a chest xray at the first of the year. She states pt is getting congested again and wanted to know if they could get the chest xray sooner as he is starting to get bad again. [phone]

## 2021-10-14 ENCOUNTER — Other Ambulatory Visit: Payer: Self-pay | Admitting: Family Medicine

## 2021-10-14 DIAGNOSIS — R9389 Abnormal findings on diagnostic imaging of other specified body structures: Secondary | ICD-10-CM

## 2021-10-15 ENCOUNTER — Other Ambulatory Visit: Payer: Self-pay

## 2021-10-15 ENCOUNTER — Ambulatory Visit (HOSPITAL_COMMUNITY)
Admission: RE | Admit: 2021-10-15 | Discharge: 2021-10-15 | Disposition: A | Discharge status: Home / Self Care | Payer: Medicare HMO | Source: Ambulatory Visit | Attending: Family Medicine | Admitting: Family Medicine

## 2021-10-15 DIAGNOSIS — R918 Other nonspecific abnormal finding of lung field: Secondary | ICD-10-CM | POA: Diagnosis not present

## 2021-10-15 DIAGNOSIS — R911 Solitary pulmonary nodule: Secondary | ICD-10-CM | POA: Diagnosis not present

## 2021-10-15 DIAGNOSIS — R9389 Abnormal findings on diagnostic imaging of other specified body structures: Secondary | ICD-10-CM | POA: Insufficient documentation

## 2021-10-15 DIAGNOSIS — I700 Atherosclerosis of aorta: Secondary | ICD-10-CM | POA: Diagnosis not present

## 2021-10-15 DIAGNOSIS — J90 Pleural effusion, not elsewhere classified: Secondary | ICD-10-CM | POA: Diagnosis not present

## 2021-10-15 DIAGNOSIS — J849 Interstitial pulmonary disease, unspecified: Secondary | ICD-10-CM | POA: Diagnosis not present

## 2021-10-15 DIAGNOSIS — J479 Bronchiectasis, uncomplicated: Secondary | ICD-10-CM | POA: Diagnosis not present

## 2021-10-22 ENCOUNTER — Ambulatory Visit: Payer: Medicare HMO | Admitting: Family Medicine

## 2021-10-23 ENCOUNTER — Ambulatory Visit: Payer: Medicare HMO | Admitting: Family Medicine

## 2021-10-26 ENCOUNTER — Other Ambulatory Visit: Payer: Self-pay

## 2021-10-26 ENCOUNTER — Ambulatory Visit
Admission: EM | Admit: 2021-10-26 | Discharge: 2021-10-26 | Disposition: A | Discharge status: Home / Self Care | Payer: Medicare HMO | Attending: Family Medicine | Admitting: Family Medicine

## 2021-10-26 ENCOUNTER — Ambulatory Visit (INDEPENDENT_AMBULATORY_CARE_PROVIDER_SITE_OTHER): Payer: Medicare HMO

## 2021-10-26 DIAGNOSIS — J209 Acute bronchitis, unspecified: Secondary | ICD-10-CM | POA: Diagnosis not present

## 2021-10-26 DIAGNOSIS — J3089 Other allergic rhinitis: Secondary | ICD-10-CM | POA: Diagnosis not present

## 2021-10-26 DIAGNOSIS — R059 Cough, unspecified: Secondary | ICD-10-CM

## 2021-10-26 DIAGNOSIS — J90 Pleural effusion, not elsewhere classified: Secondary | ICD-10-CM | POA: Diagnosis not present

## 2021-10-26 MED ORDER — BUDESONIDE-FORMOTEROL FUMARATE 160-4.5 MCG/ACT IN AERO: 2.0000 | INHALATION_SPRAY | Freq: Two times a day (BID) | RESPIRATORY_TRACT | 0 refills | Status: DC

## 2021-10-26 MED ORDER — CETIRIZINE HCL 10 MG PO TABS
10.0000 mg | ORAL_TABLET | Freq: Every day | ORAL | 2 refills | Status: AC
Start: 2021-10-26 — End: ?

## 2021-10-26 MED ORDER — FLUTICASONE PROPIONATE 50 MCG/ACT NA SUSP: 1.0000 | Freq: Two times a day (BID) | NASAL | 2 refills | Status: DC

## 2021-10-26 NOTE — ED Provider Notes (Signed)
[facility]    CSN: [id number] Arrival date & time: 10/26/21  0955      History   Chief Complaint Chief Complaint  Patient presents with   Cough    HPI MAXAMUS COLAO is a 81 y.o. male.   Patient presenting today with 2 to 20-month history of persistent waxing and waning sinus congestion, sinus pressure, productive cough, shortness of breath off and on.  States he had a sinus infection at the end of October that then progressed into pneumonia for which he was treated with antibiotics.  He then got COVID and ever since COVID infection in November has had this cough persisting.  Having some right upper back pain with coughing at this time.  Denies fever, chills, wheezing, body aches.  Taking Mucinex and NyQuil with minimal relief.  Also has an albuterol inhaler that he takes as needed.  Had a chest x-ray 2 weeks ago that showed scarring at bilateral bases, very slight pleural effusion and had a CT chest done after this study for further evaluation.  Feels the symptoms have worsened since then.   Past Medical History:  Diagnosis Date   Anal stricture 2008   Arthritis    Hypercholesteremia    Hypertension    Myocardial infarct [facility])     Patient Active Problem List   Diagnosis Date Noted   Bilateral hearing loss 07/30/2021   Aural polyp, left 07/20/2021   Legally blind 08/08/2020   Esophageal dysphagia 02/20/2018   Macular degeneration of both eyes 01/27/2015   Fibroma 09/25/2014   GERD (gastroesophageal reflux disease) 01/10/2014   Hyperlipidemia 01/10/2014   Osteoarthritis of left knee 01/10/2014   Essential hypertension, benign 03/15/2013   Other dysphagia 12/26/2012   Encounter for screening colonoscopy 12/26/2012   Fever 08/02/2012   Meningismus 08/02/2012   Headache(784.0) 08/02/2012   DEGENERATIVE JOINT DISEASE, LEFT KNEE 11/06/2007   DERANGEMENT MENISCUS 11/06/2007    Past Surgical History:  Procedure Laterality Date   BALLOON DILATION N/A  01/05/2013   Procedure: BALLOON DILATION;  Surgeon: Rogene Houston, MD;  [facility];  Service: Endoscopy;  Laterality: N/A;   BIOPSY  02/27/2018   Procedure: BIOPSY;  Surgeon: Rogene Houston, MD;  [facility];  Service: Endoscopy;;  esophagus   COLONOSCOPY WITH ESOPHAGOGASTRODUODENOSCOPY (EGD) N/A 01/05/2013   Procedure: COLONOSCOPY WITH ESOPHAGOGASTRODUODENOSCOPY (EGD);  Surgeon: Rogene Houston, MD;  [facility]: [facility];  Service: Endoscopy;  Laterality: N/A;  100   CORONARY ARTERY BYPASS GRAFT     ESOPHAGEAL DILATION N/A 02/27/2018   Procedure: ESOPHAGEAL DILATION;  Surgeon: Rogene Houston, MD;  [facility];  Service: Endoscopy;  Laterality: N/A;   ESOPHAGOGASTRODUODENOSCOPY N/A 02/27/2018   Procedure: ESOPHAGOGASTRODUODENOSCOPY (EGD);  Surgeon: Rogene Houston, MD;  [facility];  Service: Endoscopy;  Laterality: N/A;  12:25   HERNIA REPAIR         Home Medications    Prior to Admission medications   Medication Sig Start Date End Date Taking? Authorizing Provider  budesonide-formoterol (SYMBICORT) 160-4.5 MCG/ACT inhaler Inhale 2 puffs into the lungs 2 (two) times daily. Rinse mouth with water after each use 10/26/21  Yes Volney American, PA-C  cetirizine (ZYRTEC ALLERGY) 10 MG tablet Take 1 tablet (10 mg total) by mouth daily. 10/26/21  Yes Volney American, PA-C  fluticasone [facility]) 50 MCG/ACT nasal spray Place 1 spray into both nostrils 2 (two) times daily. 10/26/21  Yes Volney American, PA-C  albuterol (VENTOLIN HFA) 108 (90 Base) MCG/ACT inhaler Inhale 2 puffs into the lungs every 4 (four) hours as needed for wheezing. 08/13/21   Kathyrn Drown, MD  amLODipine (NORVASC) 2.5 MG tablet TAKE 1 TABLET BY MOUTH ONCE DAILY - NEW DOSE 10/07/21   Kathyrn Drown, MD  Ascorbic Acid (VITAMIN C) 1000 MG tablet Take 1,000 mg by mouth daily.    [provider]  aspirin 81 MG tablet Take 1 tablet (81 mg total) by  mouth daily. 02/28/18   Rehman, Mechele Dawley, MD  ciprofloxacin (CILOXAN) 0.3 % ophthalmic solution Instill 3 drops into the left ear twice daily for 2 weeks. 07/20/21   [provider]  dexamethasone (DECADRON) 0.1 % ophthalmic solution Instill 2 drops into left ear twice daily for 2 weeks. 07/20/21   [provider]  fish oil-omega-3 fatty acids 1000 MG capsule Take 1 g by mouth daily.     [provider]  fluticasone (FLONASE) 50 MCG/ACT nasal spray USE 2 SPRAY(S) IN EACH NOSTRIL ONCE DAILY 01/27/18   Kathyrn Drown, MD  ketoconazole (NIZORAL) 2 % cream APPLY TOPICALLY TO AFFECTED AREA TWICE DAILY AS NEEDED 09/17/20   Elvia Collum M, DO  meclizine (ANTIVERT) 25 MG tablet Take 1 tablet (25 mg total) by mouth 3 (three) times daily as needed for dizziness. 09/19/21   Tegeler, Gwenyth Allegra, MD  Multiple Vitamins-Minerals (OCUVITE PRESERVISION PO) Take by mouth.    [provider]  Multiple Vitamins-Minerals (PRESERVISION AREDS 2 PO) Take 1 capsule by mouth 2 (two) times daily.    [provider]  mupirocin ointment (BACTROBAN) 2 % Apply a small amount twice daily inside left nostril for 14 days. 07/20/21   [provider]  pantoprazole (PROTONIX) 40 MG tablet Take 1 tablet (40 mg total) by mouth daily. Take 30 min before breakfast. NEEDS OFFICE VISIT 09/07/21   Rogene Houston, MD  pravastatin (PRAVACHOL) 40 MG tablet Take 1 tablet by mouth once daily 08/10/21   Kathyrn Drown, MD  ramipril (ALTACE) 10 MG capsule Take 1 capsule by mouth once daily 10/07/21   Kathyrn Drown, MD    Family History Family History  Problem Relation Age of Onset   Hypertension Sister     Social History Social History   Tobacco Use   Smoking status: Former    Packs/day: 0.50    Types: Cigarettes    Quit date: 08/04/1995    Years since quitting: 26.2   Smokeless tobacco: Never  Vaping Use   Vaping Use: Never used  Substance Use Topics   Alcohol use: Yes     Comment: rare   Drug use: Never     Allergies   Patient has no known allergies.   Review of Systems Review of Systems Per HPI  Physical Exam Triage Vital Signs ED Triage Vitals  Enc Vitals Group     BP 10/26/21 1107 (!) 158/74     Pulse Rate 10/26/21 1107 69     Resp 10/26/21 1107 18     Temp 10/26/21 1107 98.3 F (36.8 C)     Temp Source 10/26/21 1107 Oral     SpO2 10/26/21 1107 98 %     Weight --      Height --      Head Circumference --      Peak Flow --      Pain Score 10/26/21 1103 8     Pain Loc --  Pain Edu? --      Excl. in [place]? --    No data found.  Updated Vital Signs BP (!) 158/74 (BP Location: Right Arm)    Pulse 69    Temp 98.3 F (36.8 C) (Oral)    Resp 18    SpO2 98%   Visual Acuity Right Eye Distance:   Left Eye Distance:   Bilateral Distance:    Right Eye Near:   Left Eye Near:    Bilateral Near:     Physical Exam Vitals and nursing note reviewed.  Constitutional:      Appearance: He is well-developed.  HENT:     Head: Atraumatic.     Right Ear: External ear normal.     Left Ear: External ear normal.     Nose: Rhinorrhea present.     Mouth/Throat:     Pharynx: Posterior oropharyngeal erythema present. No oropharyngeal exudate.  Eyes:     Conjunctiva/sclera: Conjunctivae normal.     Pupils: Pupils are equal, round, and reactive to light.  Cardiovascular:     Rate and Rhythm: Normal rate and regular rhythm.  Pulmonary:     Effort: Pulmonary effort is normal. No respiratory distress.     Breath sounds: No wheezing or rales.     Comments: Speaking in full sentences, breathing comfortably on room air Musculoskeletal:        General: Normal range of motion.     Cervical back: Normal range of motion and neck supple.  Lymphadenopathy:     Cervical: No cervical adenopathy.  Skin:    General: Skin is warm and dry.  Neurological:     Mental Status: He is alert and oriented to person, place, and time.  Psychiatric:        Behavior:  Behavior normal.     UC Treatments / Results  Labs (all labs ordered are listed, but only abnormal results are displayed) Labs Reviewed - No data to display  EKG   Radiology DG Chest 2 View  Result Date: 10/26/2021 CLINICAL DATA:  Three months of productive cough. Recent history of pneumonia. EXAM: CHEST - 2 VIEW COMPARISON:  Chest radiographs 10/13/2021, CT chest 10/15/2021 FINDINGS: Findings cardiac silhouette and mediastinal contours are unchanged and within normal limits. Status post median sternotomy and CABG. Unchanged contours of mildly prominent bilateral main pulmonary arteries. There is again moderate bilateral interstitial thickening, likely from chronic interstitial lung disease. There is again moderate flattening of the diaphragms. There is again moderate biapical pleural scarring. There is blunting of left costophrenic angle likely related to the small pleural effusion seen on 10/15/2021 chest CT. No acute skeletal abnormality. IMPRESSION:: IMPRESSION: 1. No significant change compared to 10/13/2021. 2. Moderate chronic interstitial lung disease. 3. Small left pleural effusion. Electronically Signed   By: Yvonne Kendall   On: 10/26/2021 11:56    Procedures Procedures (including critical care time)  Medications Ordered in UC Medications - No data to display  Initial Impression / Assessment and Plan / UC Course  I have reviewed the triage vital signs and the nursing notes.  Pertinent labs & imaging results that were available during my care of the patient were reviewed by me and considered in my medical decision making (see chart for details).     Vital signs overall very reassuring today with oxygen saturation of 98% on room air.  Chest x-ray was repeated today as he states his symptoms have worsened since 2 weeks ago when the x-ray was last taken.  This x-ray showed no significant change compared to previous imaging.  Suspect inflammatory, postinfectious cough and likely  some underlying seasonal allergies that are under poor control.  We will start allergy regimen with antihistamine, Flonase nasal spray and start steroid inhaler to help reduce inflammation.  Continue albuterol as needed.  Close PCP follow-up recommended for recheck.  Final Clinical Impressions(s) / UC Diagnoses   Final diagnoses:  Acute bronchitis, unspecified organism  Seasonal allergic rhinitis due to other allergic trigger   Discharge Instructions   None    ED Prescriptions     Medication Sig Dispense Auth. Provider   budesonide-formoterol (SYMBICORT) 160-4.5 MCG/ACT inhaler Inhale 2 puffs into the lungs 2 (two) times daily. Rinse mouth with water after each use 1 each Volney American, PA-C   cetirizine (ZYRTEC ALLERGY) 10 MG tablet Take 1 tablet (10 mg total) by mouth daily. 30 tablet Volney American, PA-C   fluticasone [facility]) 50 MCG/ACT nasal spray Place 1 spray into both nostrils 2 (two) times daily. 16 g Volney American, [place]      PDMP not reviewed this encounter.   Volney American, [place] 10/26/21 1913

## 2021-10-26 NOTE — ED Triage Notes (Signed)
Patient states that at the end of October he had a sinus infection.   Patient states that after the sinus infection he had Pneumonia and then Covid.   Patient states he still has a wet cough that is hurting in the right upper part of his back.   Patient states he has taken Mucinex and Nyquil.   Denies Fever

## 2021-10-27 ENCOUNTER — Telehealth: Payer: Medicare HMO | Admitting: Family Medicine

## 2021-11-17 ENCOUNTER — Encounter: Payer: Self-pay | Admitting: Family Medicine

## 2021-11-17 ENCOUNTER — Ambulatory Visit (INDEPENDENT_AMBULATORY_CARE_PROVIDER_SITE_OTHER): Payer: Medicare HMO | Admitting: Family Medicine

## 2021-11-17 ENCOUNTER — Other Ambulatory Visit: Payer: Self-pay

## 2021-11-17 VITALS — BP 132/78 | HR 63 | Temp 98.8°F | Wt 123.2 lb

## 2021-11-17 DIAGNOSIS — I10 Essential (primary) hypertension: Secondary | ICD-10-CM | POA: Diagnosis not present

## 2021-11-17 DIAGNOSIS — R911 Solitary pulmonary nodule: Secondary | ICD-10-CM | POA: Diagnosis not present

## 2021-11-17 DIAGNOSIS — J8417 Interstitial lung disease with progressive fibrotic phenotype in diseases classified elsewhere: Secondary | ICD-10-CM | POA: Diagnosis not present

## 2021-11-17 MED ORDER — BUDESONIDE-FORMOTEROL FUMARATE 160-4.5 MCG/ACT IN AERO: 2.0000 | INHALATION_SPRAY | Freq: Two times a day (BID) | RESPIRATORY_TRACT | 5 refills | Status: DC

## 2021-11-17 MED ORDER — PRAVASTATIN SODIUM 40 MG PO TABS: 40.0000 mg | ORAL_TABLET | Freq: Every day | ORAL | 1 refills | Status: DC

## 2021-11-17 MED ORDER — AMLODIPINE BESYLATE 2.5 MG PO TABS: ORAL_TABLET | ORAL | 1 refills | Status: DC

## 2021-11-17 NOTE — Progress Notes (Signed)
° °  Subjective:    Patient ID: Jason Spence, male    DOB: 08-Jun-1941, 81 y.o.   MRN: [id number]  HPI Pt had CT done on 10/15/22 and provider recommended office visit to discuss.   Pt also went to Urgent Care on 10/26/21 and has chest xray completed. Pt also needs refills on meds.  Essential hypertension, benign  Interstitial lung disease with progressive fibrotic phenotype in diseases classified elsewhere [facility]) - Plan: CT Chest Wo Contrast, Ambulatory referral to Pulmonology  Pulmonary nodule - Plan: CT Chest Wo Contrast, Ambulatory referral to Pulmonology He does relate some coughing denies any wheezing or difficulty breathing denies high fever chills  Review of Systems     Objective:   Physical Exam  General-in no acute distress Eyes-no discharge Lungs-respiratory rate normal, CTA CV-no murmurs,RRR Extremities skin warm dry no edema Neuro grossly normal Behavior normal, alert       Assessment & Plan:  1. Essential hypertension, benign Blood pressure decent control continue current measures watch diet stay active  2. Interstitial lung disease with progressive fibrotic phenotype in diseases classified elsewhere [facility]) CAT scan shows classic honeycomb pattern.  Patient not short of breath with activity.  Denies any excessive coughing currently.  We will go ahead and get pulmonary consult.  Certainly a pulmonary states that there is nothing really they can do more than likely they would see him on a yearly basis and he would follow through with [place] - CT Chest Wo Contrast - Ambulatory referral to Pulmonology  3. Pulmonary nodule His CAT scan also showed some central nodular activity that could be signs of growth versus just inflammatory changes recommended by radiology to do a follow-up CT in 3 months so therefore we will go ahead and set that up and do a follow-up office visit here to discuss - CT Chest Wo Contrast - Ambulatory referral to Pulmonology

## 2021-11-23 DIAGNOSIS — H35432 Paving stone degeneration of retina, left eye: Secondary | ICD-10-CM | POA: Diagnosis not present

## 2021-11-23 DIAGNOSIS — H353114 Nonexudative age-related macular degeneration, right eye, advanced atrophic with subfoveal involvement: Secondary | ICD-10-CM | POA: Diagnosis not present

## 2021-11-23 DIAGNOSIS — H353222 Exudative age-related macular degeneration, left eye, with inactive choroidal neovascularization: Secondary | ICD-10-CM | POA: Diagnosis not present

## 2021-11-23 DIAGNOSIS — H43812 Vitreous degeneration, left eye: Secondary | ICD-10-CM | POA: Diagnosis not present

## 2021-12-18 DIAGNOSIS — I2510 Atherosclerotic heart disease of native coronary artery without angina pectoris: Secondary | ICD-10-CM | POA: Diagnosis not present

## 2021-12-18 DIAGNOSIS — Z008 Encounter for other general examination: Secondary | ICD-10-CM | POA: Diagnosis not present

## 2021-12-18 DIAGNOSIS — I252 Old myocardial infarction: Secondary | ICD-10-CM | POA: Diagnosis not present

## 2021-12-18 DIAGNOSIS — H3530 Unspecified macular degeneration: Secondary | ICD-10-CM | POA: Diagnosis not present

## 2021-12-18 DIAGNOSIS — I10 Essential (primary) hypertension: Secondary | ICD-10-CM | POA: Diagnosis not present

## 2021-12-18 DIAGNOSIS — Z87891 Personal history of nicotine dependence: Secondary | ICD-10-CM | POA: Diagnosis not present

## 2021-12-18 DIAGNOSIS — E785 Hyperlipidemia, unspecified: Secondary | ICD-10-CM | POA: Diagnosis not present

## 2021-12-21 ENCOUNTER — Other Ambulatory Visit: Payer: Self-pay | Admitting: Family Medicine

## 2021-12-31 ENCOUNTER — Encounter: Payer: Self-pay | Admitting: Internal Medicine

## 2021-12-31 ENCOUNTER — Ambulatory Visit: Payer: Medicare HMO | Admitting: Internal Medicine

## 2021-12-31 ENCOUNTER — Other Ambulatory Visit: Payer: Self-pay

## 2021-12-31 DIAGNOSIS — I10 Essential (primary) hypertension: Secondary | ICD-10-CM

## 2021-12-31 DIAGNOSIS — J8410 Pulmonary fibrosis, unspecified: Secondary | ICD-10-CM | POA: Diagnosis not present

## 2021-12-31 NOTE — Assessment & Plan Note (Addendum)
On ACEi fall 2022 with "worse cough ever" ? ?ACE inhibitors are problematic in  pts with airway complaints because  even experienced pulmonologists can't always distinguish ace effects from copd/asthma.  By themselves they don't actually cause a problem, much like oxygen can't by itself start a fire, but they certainly serve as a powerful catalyst or enhancer for any "fire"  or inflammatory process in the upper airway, be it caused by an ET  tube or more commonly reflux (especially in the obese or pts with known GERD or who are on biphoshonates).  ? ? In the era of ARB near equivalency until we have a better handle on the reversibility of the airway problem, it just makes sense to avoid ACEI  entirely in the short run and then decide later, having established a level of airway control using a reasonable limited regimen, whether to add back ace but even then being very careful to observe the pt for worsening airway control and number of meds used/ needed to control symptoms.   ? ?Prefer ARB over ACEi but defer to PCP as more of a nuisance at this point than a major source of morbidity. ? ? ?Each maintenance medication was reviewed in detail including emphasizing most importantly the difference between maintenance and prns and under what circumstances the prns are to be triggered using an action plan format where appropriate. ? ?Total time for H and P, chart review, counseling,  directly observing portions of ambulatory 02 saturation study/ and generating customized AVS unique to this office visit / same day charting  > 45 min  ?     ?  ?       ?

## 2021-12-31 NOTE — Patient Instructions (Signed)
To get the most out of exercise, you need to be continuously aware that you are short of breath, but never out of breath, for at least 30 minutes daily. As you improve, it will actually be easier for you to do the same amount of exercise  in  30 minutes so always push to the level where you are short of breath.   ? ? ?Make sure you check your oxygen saturations at highest level of activity-  NOT after you stop  ? ?Keep your appt for follow up CT  ? ? ?Please schedule a follow up visit in 3 months but call sooner if needed  ? ?  ?

## 2021-12-31 NOTE — Progress Notes (Signed)
? ?Jason Spence, male    DOB: February 08, 1941   MRN: [id number] ? ? ?Brief patient profile:  ?23   yowm  quit 1996 due to heart  referred to pulmonary clinic in [facility]  12/31/2021 by Jason Spence  for abn ct.  Story starts Aug 12 2021 in ER with  uri type symptoms for a week and L airspace dx augmentin / zpak > cough did not improve though as dz resolved by cxr 09/16/21 ? ILD  >  cough continued / fm tested pos covid late dec as did pt > paxlovid helped cough some but cxr continued the same so CT chest 10/15/22.  ? ? ?History of Present Illness  ?12/31/2021  Pulmonary/ 1st office eval/ Jason Spence / [facility] Spence [facility]  ?Chief Complaint  ?Patient presents with  ? Consult  ?  Dr. Wolfgang Spence ref for ILD found on CT   ?Dyspnea:  walks dog 30 min mostly flat  ?Cough: better overall / some sneezing and sense of pnds on acei ?Sleep: bed is flat or two inhaler s ?SABA use: none  ? ?No obvious day to day or daytime variability or assoc excess/ purulent sputum or mucus plugs or hemoptysis or cp or chest tightness, subjective wheeze or overt   hb symptoms.  ? ?Sleeping  without nocturnal  or early am exacerbation  of respiratory  c/o's or need for noct saba. Also denies any obvious fluctuation of symptoms with weather or environmental changes or other aggravating or alleviating factors except as outlined above  ? ?No unusual exposure hx or h/o childhood pna/ asthma or knowledge of premature birth. ? ?Current Allergies, Complete Past Medical History, Past Surgical History, Family History, and Social History were reviewed in [employer] record. ? ?ROS  The following are not active complaints unless bolded ?Hoarseness, sore throat, dysphagia, dental problems, itching, sneezing,  nasal congestion or discharge of excess mucus or purulent secretions, ear ache,   fever, chills, sweats, unintended wt loss or wt gain, classically pleuritic or exertional cp,  orthopnea pnd or arm/hand swelling  or leg swelling, presyncope,  palpitations, abdominal pain, anorexia, nausea, vomiting, diarrhea  or change in bowel habits or change in bladder habits, change in stools or change in urine, dysuria, hematuria,  rash, arthralgias, visual complaints, headache, numbness, weakness or ataxia or problems with walking or coordination,  change in mood or  memory. ?      ?  ? ? ?Past Medical History:  ?Diagnosis Date  ? Anal stricture 2008  ? Arthritis   ? Hypercholesteremia   ? Hypertension   ? Myocardial infarct [facility])   ? ? ?Outpatient Medications Prior to Visit  ?Medication Sig Dispense Refill  ? albuterol (VENTOLIN HFA) 108 (90 Base) MCG/ACT inhaler Inhale 2 puffs into the lungs every 4 (four) hours as needed for wheezing. 1 each 2  ? amLODipine (NORVASC) 2.5 MG tablet 1 qd 90 tablet 1  ? Ascorbic Acid (VITAMIN C) 1000 MG tablet Take 1,000 mg by mouth daily.    ? aspirin 81 MG tablet Take 1 tablet (81 mg total) by mouth daily. 30 tablet   ? budesonide-formoterol (SYMBICORT) 160-4.5 MCG/ACT inhaler Inhale 2 puffs into the lungs 2 (two) times daily. Rinse mouth with water after each use 1 each 5  ? cetirizine (ZYRTEC ALLERGY) 10 MG tablet Take 1 tablet (10 mg total) by mouth daily. 30 tablet 2  ? ciprofloxacin (CILOXAN) 0.3 % ophthalmic solution Instill 3 drops into the left ear twice daily for  2 weeks.    ? dexamethasone (DECADRON) 0.1 % ophthalmic solution Instill 2 drops into left ear twice daily for 2 weeks.    ? fish oil-omega-3 fatty acids 1000 MG capsule Take 1 g by mouth daily.     ? fluticasone (FLONASE) 50 MCG/ACT nasal spray USE 2 SPRAY(S) IN EACH NOSTRIL ONCE DAILY 16 g 5  ? fluticasone (FLONASE) 50 MCG/ACT nasal spray Place 1 spray into both nostrils 2 (two) times daily. 16 g 2  ? ketoconazole (NIZORAL) 2 % cream APPLY TOPICALLY TO AFFECTED AREA TWICE DAILY AS NEEDED 60 g 0  ? meclizine (ANTIVERT) 25 MG tablet Take 1 tablet (25 mg total) by mouth 3 (three) times daily as needed for dizziness. 21 tablet 0  ? Multiple Vitamins-Minerals  (OCUVITE PRESERVISION PO) Take by mouth.    ? Multiple Vitamins-Minerals (PRESERVISION AREDS 2 PO) Take 1 capsule by mouth 2 (two) times daily.    ? mupirocin ointment (BACTROBAN) 2 % Apply a small amount twice daily inside left nostril for 14 days.    ? pantoprazole (PROTONIX) 40 MG tablet Take 1 tablet (40 mg total) by mouth daily. Take 30 min before breakfast. NEEDS OFFICE VISIT 30 tablet 0  ? pravastatin (PRAVACHOL) 40 MG tablet Take 1 tablet (40 mg total) by mouth daily. 90 tablet 1  ? ramipril (ALTACE) 10 MG capsule Take 1 capsule by mouth once daily 90 capsule 0  ? ?No facility-administered medications prior to visit.  ? ? ? ?Objective:  ?  ? ?BP 130/62 (BP Location: Left Arm, Patient Position: Sitting)   Pulse 70   Temp 98 ?F (36.7 ?C) (Temporal)   Ht '5\' 7"'$  (1.702 m)   Wt 124 lb 3.2 oz (56.3 kg)   SpO2 98% Comment: ra  BMI 19.45 kg/m?  ? ?SpO2: 98 % (ra) ? ? HEENT : pt wearing mask not removed for exam due to covid -19 concerns.  ? ? ?NECK :  without JVD/Nodes/TM/ nl carotid upstrokes bilaterally ? ? ?LUNGS: no acc muscle use,  Nl contour chest which is clear to A and P bilaterally without cough on insp or exp maneuvers ? ? ?CV:  RRR  no s3 or murmur or increase in P2, and no edema  ? ?ABD:  soft and nontender with nl inspiratory excursion in the supine position. No bruits or organomegaly appreciated, bowel sounds nl ? ?MS:  Nl gait/ ext warm without deformities, calf tenderness, cyanosis  - No clubbing ?No obvious joint restrictions  ? ?SKIN: warm and dry without lesions   ? ?NEURO:  alert, approp, nl sensorium with  no motor or cerebellar deficits apparent.  ? ? ?I personally reviewed images and agree with radiology impression as follows:  ? Chest CT (not hrct)  10/15/21 ?1. Fibrotic interstitial lung disease with lower lung predominance ?and honeycomb change. Findings are consistent with UIP.  ?2. Irregular nodular opacities of the bilateral lung apices, likely ?related to pleuroparenchymal  scarring. Recommend follow-up chest CT ?in 3 months to ensure stability. ?3. Solid 1.4 cm pulmonary nodule the left lower lobe containing ?macroscopic fat, compatible with a benign hamartoma. ?4. Trace left pleural effusion. ?5. Aortic Atherosclerosis (ICD10-I70.0). ?   ?Assessment  ? ? ?Postinflammatory pulmonary fibrosis (HCC) ?S/p COVID 19 omicron infection Dec 2022  previously vaccinated  ?- CT chest 10/15/21 ? UIP  ?- 12/31/2021   Walked on RA  x  3  lap(s) =  approx 450  ft  @ rapid  pace, stopped due  to end of study  with lowest 02 sats 94%  ? ?The abrupt onset of just cough that was not on inspiration and not assoc with gradual doe are features not at all typical of UIP though this may be a very early presentation and have nothing to do with his symptoms which are more typical of an ACEi cough while on Altace. ? ?rec ?Agree with repeat CT as planned but needs to be HRCT ? ?In meantime advised: ?To get the most out of exercise, you need to be continuously aware that you are short of breath, but never out of breath, for at least 30 minutes daily. As you improve, it will actually be easier for you to do the same amount of exercise  in  30 minutes so always push to the level where you are short of breath  ? ?Make sure you check your oxygen saturations at highest level of activity  ? ? ?Essential hypertension, benign ?On ACEi fall 2022 with "worse cough ever" ? ?ACE inhibitors are problematic in  pts with airway complaints because  even experienced pulmonologists can't always distinguish ace effects from copd/asthma.  By themselves they don't actually cause a problem, much like oxygen can't by itself start a fire, but they certainly serve as a powerful catalyst or enhancer for any "fire"  or inflammatory process in the upper airway, be it caused by an ET  tube or more commonly reflux (especially in the obese or pts with known GERD or who are on biphoshonates).  ? ? In the era of ARB near equivalency until we have  a better handle on the reversibility of the airway problem, it just makes sense to avoid ACEI  entirely in the short run and then decide later, having established a level of airway control using a reasonable limited regimen, w

## 2022-01-01 ENCOUNTER — Encounter: Payer: Self-pay | Admitting: Internal Medicine

## 2022-01-01 NOTE — Assessment & Plan Note (Signed)
S/p COVID 19 omicron infection Dec 2022  previously vaccinated  ?- CT chest 10/15/21 ? UIP  ?- 12/31/2021   Walked on RA  x  3  lap(s) =  approx 450  ft  @ rapid  pace, stopped due to end of study  with lowest 02 sats 94%  ? ?The abrupt onset of just cough that was not on inspiration and not assoc with gradual doe are features not at all typical of UIP though this may be a very early presentation and have nothing to do with his symptoms which are more typical of an ACEi cough while on Altace. ? ?rec ?Agree with repeat CT as planned but needs to be HRCT ? ?In meantime advised: ?To get the most out of exercise, you need to be continuously aware that you are short of breath, but never out of breath, for at least 30 minutes daily. As you improve, it will actually be easier for you to do the same amount of exercise  in  30 minutes so always push to the level where you are short of breath  ? ?Make sure you check your oxygen saturations at highest level of activity  ?

## 2022-01-05 ENCOUNTER — Ambulatory Visit: Payer: Medicare HMO | Admitting: Family Medicine

## 2022-01-20 ENCOUNTER — Ambulatory Visit: Payer: Medicare HMO | Admitting: Family Medicine

## 2022-01-22 ENCOUNTER — Ambulatory Visit (HOSPITAL_COMMUNITY)
Admission: RE | Admit: 2022-01-22 | Discharge: 2022-01-22 | Disposition: A | Discharge status: Home / Self Care | Payer: Medicare HMO | Source: Ambulatory Visit | Attending: Family Medicine | Admitting: Family Medicine

## 2022-01-22 DIAGNOSIS — R918 Other nonspecific abnormal finding of lung field: Secondary | ICD-10-CM | POA: Diagnosis not present

## 2022-01-22 DIAGNOSIS — J849 Interstitial pulmonary disease, unspecified: Secondary | ICD-10-CM | POA: Diagnosis not present

## 2022-01-22 DIAGNOSIS — R911 Solitary pulmonary nodule: Secondary | ICD-10-CM | POA: Diagnosis not present

## 2022-01-22 DIAGNOSIS — J8417 Interstitial lung disease with progressive fibrotic phenotype in diseases classified elsewhere: Secondary | ICD-10-CM | POA: Insufficient documentation

## 2022-01-22 DIAGNOSIS — J810 Acute pulmonary edema: Secondary | ICD-10-CM | POA: Diagnosis not present

## 2022-01-26 ENCOUNTER — Ambulatory Visit (INDEPENDENT_AMBULATORY_CARE_PROVIDER_SITE_OTHER): Payer: Medicare HMO | Admitting: Family Medicine

## 2022-01-26 VITALS — BP 121/50 | HR 56 | Temp 98.4°F | Ht 67.0 in | Wt 127.0 lb

## 2022-01-26 DIAGNOSIS — J8410 Pulmonary fibrosis, unspecified: Secondary | ICD-10-CM

## 2022-01-26 DIAGNOSIS — I10 Essential (primary) hypertension: Secondary | ICD-10-CM

## 2022-01-26 MED ORDER — RAMIPRIL 10 MG PO CAPS: 10.0000 mg | ORAL_CAPSULE | Freq: Every day | ORAL | 1 refills | Status: DC

## 2022-01-26 MED ORDER — PANTOPRAZOLE SODIUM 40 MG PO TBEC: 40.0000 mg | DELAYED_RELEASE_TABLET | Freq: Every day | ORAL | 6 refills | Status: DC

## 2022-01-26 NOTE — Patient Instructions (Signed)
We will work on setting you up with [facility] ?Please do your lab work before your follow-up visit in mid July ?You can do your lab work in early July ?Please let us know if you are having any problems ?TakeCare-Dr. Nicki Reaper ?

## 2022-01-26 NOTE — Progress Notes (Signed)
? ?  Subjective:  ? ? Patient ID: Jason Spence, male    DOB: 11/02/1940, 81 y.o.   MRN: [id number] ? ?HPI ? ?Follow up from previous visit, recent pulmonology appt questions about possible medication interaction with ramipril , had recent CT done ?Patient recently was seen by pulmonary ?May have him set up again for a follow-up in the summer ?In addition to this he had a recent CT scan which showed what appears to be pulmonary fibrosis ?He is on ramipril ?The pulmonologist mentioned to him potentially coming away from this medication because of potential to cause coughing ?Patient relates he is not having any significant coughing currently and would like to stay on the current medicine ? ?Weight check  ? ?Review of Systems ? ?   ?Objective:  ? Physical Exam ? ?General-in no acute distress ?Eyes-no discharge ?Lungs-respiratory rate normal, crackles in the bases are noted O2 saturation 96% no respiratory distress ?CV-no murmurs,RRR ?Extremities skin warm dry no edema ?Neuro grossly normal ?Behavior normal, alert ? ? ? ?   ?Assessment & Plan:  ?After shared discussion with the patient and his daughter-in-law ?We will go ahead with consultation with [facility] for pulmonary fibrosis will need further work-up patient currently relatively asymptomatic except when exerting himself ? ?Patient will follow-up by mid summer with lab work for other health issues ? ?

## 2022-01-27 NOTE — Progress Notes (Signed)
01/27/22- referral placed in Epic  ?

## 2022-02-25 ENCOUNTER — Ambulatory Visit: Payer: Medicare HMO | Admitting: Physician Assistant

## 2022-02-25 ENCOUNTER — Encounter: Payer: Self-pay | Admitting: Physician Assistant

## 2022-02-25 DIAGNOSIS — Z1283 Encounter for screening for malignant neoplasm of skin: Secondary | ICD-10-CM

## 2022-02-25 DIAGNOSIS — L570 Actinic keratosis: Secondary | ICD-10-CM | POA: Diagnosis not present

## 2022-02-25 DIAGNOSIS — Z85828 Personal history of other malignant neoplasm of skin: Secondary | ICD-10-CM

## 2022-02-25 DIAGNOSIS — L309 Dermatitis, unspecified: Secondary | ICD-10-CM | POA: Diagnosis not present

## 2022-02-25 MED ORDER — TRIAMCINOLONE ACETONIDE 0.1 % EX CREA: 1.0000 "application " | TOPICAL_CREAM | Freq: Every day | CUTANEOUS | 3 refills | Status: DC

## 2022-02-25 NOTE — Patient Instructions (Signed)
? ?  Over the counter clotrimazole ?

## 2022-02-25 NOTE — Progress Notes (Signed)
? ?  Follow-Up Visit ?  ?Subjective  ?Jason Spence is a 81 y.o. male who presents for the following: Annual Exam (Here for yearly skin exam. Concerns patient had place on ears He is not sure which one. He also has large lump on hand. Possible cyst? History of non mole skin cancers. ). ? ? ?The following portions of the chart were reviewed this encounter and updated as appropriate:  Tobacco  Allergies  Meds  Problems  Med Hx  Surg Hx  Fam Hx   ?  ? ?Objective  ?Well appearing patient in no apparent distress; mood and affect are within normal limits. ? ?All skin waist up examined. ? ?No atypical nevi or signs of NMSC noted at the time of the visit.  ? ?Mid Parietal Scalp, Right Mid Helix (3) ?Erythematous patches with gritty scale. ? ?Left Ankle - Anterior ?Thin scaly erythematous papules coalescing to plaques.  ? ? ?Assessment & Plan  ?Encounter for screening for malignant neoplasm of skin ? ?Yearly skin examinations ? ?AK (actinic keratosis) (4) ?Right Mid Helix (3); Mid Parietal Scalp ? ?Destruction of lesion - Mid Parietal Scalp, Right Mid Helix ?Complexity: simple   ?Destruction method: cryotherapy   ?Informed consent: discussed and consent obtained   ?Timeout:  patient name, date of birth, surgical site, and procedure verified ?Lesion destroyed using liquid nitrogen: Yes   ?Cryotherapy cycles:  3 ?Outcome: patient tolerated procedure well with no complications   ? ?Dermatitis ?Left Ankle - Anterior ? ?Otc clotrimazole -if no help will refill ketoconazole 2% cream ? ?triamcinolone cream (KENALOG) 0.1 % - Left Ankle - Anterior ?Apply 1 application. topically daily. ? ? ? ?I, Bernabe Dorce, PA-C, have reviewed all documentation's for this visit.  The documentation on 02/25/22 for the exam, diagnosis, procedures and orders are all accurate and complete. ?

## 2022-03-01 ENCOUNTER — Other Ambulatory Visit: Payer: Self-pay | Admitting: Family Medicine

## 2022-03-05 ENCOUNTER — Ambulatory Visit
Admission: EM | Admit: 2022-03-05 | Discharge: 2022-03-05 | Disposition: A | Discharge status: Home / Self Care | Payer: Medicare HMO | Attending: Family Medicine | Admitting: Family Medicine

## 2022-03-05 DIAGNOSIS — J22 Unspecified acute lower respiratory infection: Secondary | ICD-10-CM | POA: Diagnosis not present

## 2022-03-05 DIAGNOSIS — Z8709 Personal history of other diseases of the respiratory system: Secondary | ICD-10-CM | POA: Diagnosis not present

## 2022-03-05 MED ORDER — PREDNISONE 20 MG PO TABS: 40.0000 mg | ORAL_TABLET | Freq: Every day | ORAL | 0 refills | Status: DC

## 2022-03-05 MED ORDER — AZITHROMYCIN 250 MG PO TABS: ORAL_TABLET | ORAL | 0 refills | Status: DC

## 2022-03-05 NOTE — ED Provider Notes (Signed)
[facility]    CSN: [id number] Arrival date & time: 03/05/22  1104      History   Chief Complaint Chief Complaint  Patient presents with   Cough    Back and rib pain with a cough    HPI Jason Spence is a 81 y.o. male.   Presenting today with over a week of chills off-and-on, hacking cough, chest tightness, occasional shortness of breath, rib soreness from coughing.  Thinks he ran a low-grade fever initially but that has resolved.  Has been trying Mucinex and his inhaler regimen with little relief.  Recently diagnosed with pulmonary fibrosis on Symbicort twice daily, albuterol as needed.  No known sick contacts recently.   Past Medical History:  Diagnosis Date   Anal stricture 2008   Arthritis    Hypercholesteremia    Hypertension    Myocardial infarct [facility])     Patient Active Problem List   Diagnosis Date Noted   Postinflammatory pulmonary fibrosis ([place]) 12/31/2021   Bilateral hearing loss 07/30/2021   Aural polyp, left 07/20/2021   Legally blind 08/08/2020   Esophageal dysphagia 02/20/2018   Macular degeneration of both eyes 01/27/2015   Fibroma 09/25/2014   GERD (gastroesophageal reflux disease) 01/10/2014   Hyperlipidemia 01/10/2014   Osteoarthritis of left knee 01/10/2014   Essential hypertension, benign 03/15/2013   Other dysphagia 12/26/2012   Encounter for screening colonoscopy 12/26/2012   Fever 08/02/2012   Meningismus 08/02/2012   Headache(784.0) 08/02/2012   DEGENERATIVE JOINT DISEASE, LEFT KNEE 11/06/2007   DERANGEMENT MENISCUS 11/06/2007    Past Surgical History:  Procedure Laterality Date   BALLOON DILATION N/A 01/05/2013   Procedure: BALLOON DILATION;  Surgeon: Rogene Houston, MD;  [facility];  Service: Endoscopy;  Laterality: N/A;   BIOPSY  02/27/2018   Procedure: BIOPSY;  Surgeon: Rogene Houston, MD;  [facility];  Service: Endoscopy;;  esophagus   COLONOSCOPY WITH ESOPHAGOGASTRODUODENOSCOPY (EGD)  N/A 01/05/2013   Procedure: COLONOSCOPY WITH ESOPHAGOGASTRODUODENOSCOPY (EGD);  Surgeon: Rogene Houston, MD;  [facility];  Service: Endoscopy;  Laterality: N/A;  100   CORONARY ARTERY BYPASS GRAFT     ESOPHAGEAL DILATION N/A 02/27/2018   Procedure: ESOPHAGEAL DILATION;  Surgeon: Rogene Houston, MD;  [facility];  Service: Endoscopy;  Laterality: N/A;   ESOPHAGOGASTRODUODENOSCOPY N/A 02/27/2018   Procedure: ESOPHAGOGASTRODUODENOSCOPY (EGD);  Surgeon: Rogene Houston, MD;  [facility];  Service: Endoscopy;  Laterality: N/A;  12:25   HERNIA REPAIR         Home Medications    Prior to Admission medications   Medication Sig Start Date End Date Taking? Authorizing Provider  azithromycin (ZITHROMAX) 250 MG tablet Take first 2 tablets together, then 1 every day until finished. 03/05/22  Yes Volney American, PA-C  predniSONE (DELTASONE) 20 MG tablet Take 2 tablets (40 mg total) by mouth daily with breakfast. 03/05/22  Yes Volney American, PA-C  albuterol (VENTOLIN HFA) 108 (90 Base) MCG/ACT inhaler Inhale 2 puffs into the lungs every 4 (four) hours as needed for wheezing. 08/13/21   Kathyrn Drown, MD  amLODipine (NORVASC) 2.5 MG tablet 1 qd 11/17/21   Kathyrn Drown, MD  Ascorbic Acid (VITAMIN C) 1000 MG tablet Take 1,000 mg by mouth daily.    [provider]  aspirin 81 MG tablet Take 1 tablet (81 mg total) by mouth daily. 02/28/18   Rogene Houston, MD  budesonide-formoterol (SYMBICORT) 160-4.5  MCG/ACT inhaler Inhale 2 puffs into the lungs 2 (two) times daily. Rinse mouth with water after each use 11/17/21   [name], Elayne Snare, MD  cetirizine (ZYRTEC ALLERGY) 10 MG tablet Take 1 tablet (10 mg total) by mouth daily. 10/26/21   Volney American, PA-C  ciprofloxacin (CILOXAN) 0.3 % ophthalmic solution Instill 3 drops into the left ear twice daily for 2 weeks. 07/20/21   [provider]  dexamethasone (DECADRON) 0.1 % ophthalmic  solution Instill 2 drops into left ear twice daily for 2 weeks. 07/20/21   [provider]  fish oil-omega-3 fatty acids 1000 MG capsule Take 1 g by mouth daily.     [provider]  fluticasone (FLONASE) 50 MCG/ACT nasal spray USE 2 SPRAY(S) IN EACH NOSTRIL ONCE DAILY 01/27/18   Kathyrn Drown, MD  fluticasone (FLONASE) 50 MCG/ACT nasal spray Place 1 spray into both nostrils 2 (two) times daily. 10/26/21   Volney American, PA-C  ketoconazole (NIZORAL) 2 % cream APPLY TOPICALLY TO AFFECTED AREA TWICE DAILY AS NEEDED 09/17/20   Elvia Collum M, DO  meclizine (ANTIVERT) 25 MG tablet Take 1 tablet (25 mg total) by mouth 3 (three) times daily as needed for dizziness. 09/19/21   Tegeler, Gwenyth Allegra, MD  Multiple Vitamins-Minerals (OCUVITE PRESERVISION PO) Take by mouth.    [provider]  Multiple Vitamins-Minerals (PRESERVISION AREDS 2 PO) Take 1 capsule by mouth 2 (two) times daily.    [provider]  mupirocin ointment (BACTROBAN) 2 % Apply a small amount twice daily inside left nostril for 14 days. 07/20/21   [provider]  pantoprazole (PROTONIX) 40 MG tablet Take 1 tablet (40 mg total) by mouth daily. Take 30 min before breakfast. NEEDS OFFICE VISIT 01/26/22   Kathyrn Drown, MD  pravastatin (PRAVACHOL) 40 MG tablet Take 1 tablet (40 mg total) by mouth daily. 11/17/21   Kathyrn Drown, MD  ramipril (ALTACE) 10 MG capsule Take 1 capsule (10 mg total) by mouth daily. 01/26/22   Kathyrn Drown, MD  triamcinolone cream (KENALOG) 0.1 % Apply 1 application. topically daily. 02/25/22   Warren Danes, PA-C    Family History Family History  Problem Relation Age of Onset   Hypertension Sister     Social History Social History   Tobacco Use   Smoking status: Former    Packs/day: 0.50    Types: Cigarettes    Quit date: 08/04/1995    Years since quitting: 26.6   Smokeless tobacco: Never  Vaping Use   Vaping Use: Never used  Substance Use  Topics   Alcohol use: Yes    Comment: rare   Drug use: Never     Allergies   Patient has no known allergies.   Review of Systems Review of Systems Per HPI  Physical Exam Triage Vital Signs ED Triage Vitals  Enc Vitals Group     BP 03/05/22 1248 (!) 164/70     Pulse Rate 03/05/22 1248 67     Resp 03/05/22 1248 18     Temp 03/05/22 1248 98.6 F (37 C)     Temp Source 03/05/22 1248 Oral     SpO2 03/05/22 1248 97 %     Weight --      Height --      Head Circumference --      Peak Flow --      Pain Score 03/05/22 1251 5     Pain Loc --  Pain Edu? --      Excl. in [place]? --    No data found.  Updated Vital Signs BP (!) 164/70 (BP Location: Right Arm)   Pulse 67   Temp 98.6 F (37 C) (Oral)   Resp 18   SpO2 97%   Visual Acuity Right Eye Distance:   Left Eye Distance:   Bilateral Distance:    Right Eye Near:   Left Eye Near:    Bilateral Near:     Physical Exam Vitals and nursing note reviewed.  Constitutional:      Appearance: He is well-developed.  HENT:     Head: Atraumatic.     Right Ear: External ear normal.     Left Ear: External ear normal.     Nose: Rhinorrhea present.     Mouth/Throat:     Pharynx: Posterior oropharyngeal erythema present. No oropharyngeal exudate.  Eyes:     Conjunctiva/sclera: Conjunctivae normal.     Pupils: Pupils are equal, round, and reactive to light.  Cardiovascular:     Rate and Rhythm: Normal rate and regular rhythm.  Pulmonary:     Effort: Pulmonary effort is normal. No respiratory distress.     Breath sounds: Wheezing present. No rales.  Musculoskeletal:        General: Normal range of motion.     Cervical back: Normal range of motion and neck supple.  Lymphadenopathy:     Cervical: No cervical adenopathy.  Skin:    General: Skin is warm and dry.  Neurological:     Mental Status: He is alert and oriented to person, place, and time.  Psychiatric:        Behavior: Behavior normal.     UC Treatments /  Results  Labs (all labs ordered are listed, but only abnormal results are displayed) Labs Reviewed - No data to display  EKG   Radiology No results found.  Procedures Procedures (including critical care time)  Medications Ordered in UC Medications - No data to display  Initial Impression / Assessment and Plan / UC Course  I have reviewed the triage vital signs and the nursing notes.  Pertinent labs & imaging results that were available during my care of the patient were reviewed by me and considered in my medical decision making (see chart for details).     Vital signs overall reassuring, given duration and persistence of symptoms as well as his underlying pulmonary disease, will cover with azithromycin, prednisone in addition to inhaler regimen.  Continue Mucinex, fluids, rest.  Return for worsening symptoms.  Final Clinical Impressions(s) / UC Diagnoses   Final diagnoses:  Lower respiratory infection  History of pulmonary fibrosis   Discharge Instructions   None    ED Prescriptions     Medication Sig Dispense Auth. Provider   predniSONE (DELTASONE) 20 MG tablet Take 2 tablets (40 mg total) by mouth daily with breakfast. 10 tablet Volney American, PA-C   azithromycin (ZITHROMAX) 250 MG tablet Take first 2 tablets together, then 1 every day until finished. 6 tablet Volney American, [place]      PDMP not reviewed this encounter.   Volney American, [place] 03/05/22 1357

## 2022-03-05 NOTE — ED Triage Notes (Signed)
Pt states that about a week ago he started coughing all day with chills off and on  Pt states he did some yard work and is now sore in the back and abdomin  Pt states he has ran a low grade fever  Pt states he tried some Mucinex and his inhaler with little relief  Pt states he was just diagnosed with Pulmonary Fibrosis

## 2022-04-05 ENCOUNTER — Ambulatory Visit: Payer: Medicare HMO | Admitting: Internal Medicine

## 2022-04-13 ENCOUNTER — Encounter: Payer: Self-pay | Admitting: Family Medicine

## 2022-04-13 ENCOUNTER — Ambulatory Visit (INDEPENDENT_AMBULATORY_CARE_PROVIDER_SITE_OTHER): Payer: Medicare HMO | Admitting: Family Medicine

## 2022-04-13 VITALS — BP 120/50 | HR 73 | Temp 98.1°F | Wt 123.6 lb

## 2022-04-13 DIAGNOSIS — R634 Abnormal weight loss: Secondary | ICD-10-CM | POA: Diagnosis not present

## 2022-04-13 DIAGNOSIS — I10 Essential (primary) hypertension: Secondary | ICD-10-CM | POA: Diagnosis not present

## 2022-04-13 DIAGNOSIS — E7849 Other hyperlipidemia: Secondary | ICD-10-CM

## 2022-04-13 DIAGNOSIS — R739 Hyperglycemia, unspecified: Secondary | ICD-10-CM

## 2022-04-13 DIAGNOSIS — I2510 Atherosclerotic heart disease of native coronary artery without angina pectoris: Secondary | ICD-10-CM | POA: Insufficient documentation

## 2022-04-13 NOTE — Assessment & Plan Note (Signed)
Laboratory studies for further evaluation.  Advised to eat regular meals and to focus on protein intake.

## 2022-04-13 NOTE — Progress Notes (Signed)
Subjective:  Patient ID: Jason Spence, male    DOB: 12-10-1940  Age: 81 y.o. MRN: [id number]  CC: Chief Complaint  Patient presents with   Dizziness    Dizziness when getting up and bending over. Going on about 5-6 days but is better today. At night, pt is unable to walk straight. Bad headache when dizziness began but that has improved. Appetite has decreased. Pt has knot on right thumb that feels like a rubber ball.     HPI:  81 year old male presents for evaluation of the above.  Patient has history of coronary artery disease as well as hypertension, macular degeneration, pulmonary fibrosis.  He states that for the past week he has felt lightheaded and dizzy.  Worse when bending over.  Also worse when getting up from a seated position.  He is concerned that this may be related to his blood pressure.  Patient also reports ongoing weight loss.  Per review of the electronic medical record he has lost 9 pounds since April 2022.  He states that he eats regularly but has times where he does not eat full meals.  No reports of polydipsia.  He does note that he urinates frequently.  He has some baseline shortness of breath due to his lung disease.  No chest pain.  No other reported symptoms at this time.  Patient Active Problem List   Diagnosis Date Noted   CAD (coronary artery disease) 04/13/2022   Unintentional weight loss 04/13/2022   Postinflammatory pulmonary fibrosis (HCC) 12/31/2021   Bilateral hearing loss 07/30/2021   Aural polyp, left 07/20/2021   Legally blind 08/08/2020   Macular degeneration of both eyes 01/27/2015   Fibroma 09/25/2014   GERD (gastroesophageal reflux disease) 01/10/2014   Hyperlipidemia 01/10/2014   Osteoarthritis of left knee 01/10/2014   Essential hypertension, benign 03/15/2013    Social Hx   Social History   Socioeconomic History   Marital status: Widowed    Spouse name: Not on file   Number of children: Not on file   Years of education: Not  on file   Highest education level: Not on file  Occupational History   Not on file  Tobacco Use   Smoking status: Former    Packs/day: 0.50    Types: Cigarettes    Quit date: 08/04/1995    Years since quitting: 26.7   Smokeless tobacco: Never  Vaping Use   Vaping Use: Never used  Substance and Sexual Activity   Alcohol use: Yes    Comment: rare   Drug use: Never   Sexual activity: Yes    Birth control/protection: None  Other Topics Concern   Not on file  Social History Narrative   ** Merged History Encounter **       Social Determinants of Health   Financial Resource Strain: Low Risk  (08/18/2021)   Overall Financial Resource Strain (CARDIA)    Difficulty of Paying Living Expenses: Not hard at all  Food Insecurity: No Food Insecurity (08/18/2021)   Hunger Vital Sign    Worried About Running Out of Food in the Last Year: Never true    Ran Out of Food in the Last Year: Never true  Transportation Needs: No Transportation Needs (08/18/2021)   PRAPARE - Administrator, Civil Service (Medical): No    Lack of Transportation (Non-Medical): No  Physical Activity: Insufficiently Active (08/18/2021)   Exercise Vital Sign    Days of Exercise per Week: 5 days  Minutes of Exercise per Session: 20 min  Stress: No Stress Concern Present (08/18/2021)   [employer] of Occupational Health - Occupational Stress Questionnaire    Feeling of Stress : Not at all  Social Connections: Moderately Integrated (08/18/2021)   Social Connection and Isolation Panel [NHANES]    Frequency of Communication with Friends and Family: More than three times a week    Frequency of Social Gatherings with Friends and Family: More than three times a week    Attends Religious Services: More than 4 times per year    Active Member of [employer] or Organizations: Yes    Attends Banker Meetings: More than 4 times per year    Marital Status: Widowed    Review of Systems Per  HPI  Objective:  BP (!) 120/50   Pulse 73   Temp 98.1 F (36.7 C)   Wt 123 lb 9.6 oz (56.1 kg)   SpO2 98%   BMI 19.36 kg/m      04/13/2022   10:35 AM 03/05/2022   12:48 PM 01/26/2022    1:05 PM  BP/Weight  Systolic BP 120 164 121  Diastolic BP 50 70 50  Wt. (Lbs) 123.6  127  BMI 19.36 kg/m2  19.89 kg/m2    Physical Exam Constitutional:      General: He is not in acute distress. HENT:     Head: Normocephalic and atraumatic.  Eyes:     General:        Right eye: No discharge.        Left eye: No discharge.     Conjunctiva/sclera: Conjunctivae normal.  Cardiovascular:     Rate and Rhythm: Normal rate and regular rhythm.  Pulmonary:     Effort: Pulmonary effort is normal.     Breath sounds: Rales present.  Neurological:     Mental Status: He is alert.  Psychiatric:        Mood and Affect: Mood normal.        Behavior: Behavior normal.     Lab Results  Component Value Date   WBC 8.8 09/19/2021   HGB 13.1 09/19/2021   HCT 42.2 09/19/2021   PLT 314 09/19/2021   GLUCOSE 179 (H) 09/19/2021   CHOL 118 08/05/2021   TRIG 116 08/05/2021   HDL 33 (L) 08/05/2021   LDLCALC 64 08/05/2021   ALT 12 08/05/2021   AST 20 08/05/2021   NA 136 09/19/2021   K 3.6 09/19/2021   CL 100 09/19/2021   CREATININE 0.65 09/19/2021   BUN 13 09/19/2021   CO2 28 09/19/2021   TSH 0.545 08/03/2012   PSA 3.11 07/08/2014     Assessment & Plan:   Problem List Items Addressed This Visit       Cardiovascular and Mediastinum   Essential hypertension, benign (Chronic)    BP 120/50 today.  Discontinue amlodipine.  Advised to increase hydration.  Labs today as well.        Other   Unintentional weight loss - Primary    Laboratory studies for further evaluation.  Advised to eat regular meals and to focus on protein intake.      Relevant Orders   CBC   CMP14+EGFR   TSH   Hyperlipidemia   Relevant Orders   Lipid panel   Other Visit Diagnoses     Hyperglycemia       Relevant  Orders   Hemoglobin A1c      Follow-up:  Return in about 2 weeks (  around 04/27/2022).  Everlene Other DO [facility]

## 2022-04-14 LAB — CBC
Hematocrit: 36.9 % — ABNORMAL LOW (ref 37.5–51.0)
Hemoglobin: 12 g/dL — ABNORMAL LOW (ref 13.0–17.7)
MCH: 26.3 pg — ABNORMAL LOW (ref 26.6–33.0)
MCHC: 32.5 g/dL (ref 31.5–35.7)
MCV: 81 fL (ref 79–97)
Platelets: 289 10*3/uL (ref 150–450)
RBC: 4.56 x10E6/uL (ref 4.14–5.80)
RDW: 14.1 % (ref 11.6–15.4)
WBC: 11.3 10*3/uL — ABNORMAL HIGH (ref 3.4–10.8)

## 2022-04-14 LAB — CMP14+EGFR
ALT: 12 IU/L (ref 0–44)
AST: 23 IU/L (ref 0–40)
Albumin/Globulin Ratio: 1.5 (ref 1.2–2.2)
Albumin: 4.5 g/dL (ref 3.6–4.6)
Alkaline Phosphatase: 100 IU/L (ref 44–121)
BUN/Creatinine Ratio: 14 (ref 10–24)
BUN: 10 mg/dL (ref 8–27)
Bilirubin Total: 0.6 mg/dL (ref 0.0–1.2)
CO2: 22 mmol/L (ref 20–29)
Calcium: 9.8 mg/dL (ref 8.6–10.2)
Chloride: 97 mmol/L (ref 96–106)
Creatinine, Ser: 0.71 mg/dL — ABNORMAL LOW (ref 0.76–1.27)
Globulin, Total: 3.1 g/dL (ref 1.5–4.5)
Glucose: 92 mg/dL (ref 70–99)
Potassium: 4.3 mmol/L (ref 3.5–5.2)
Sodium: 139 mmol/L (ref 134–144)
Total Protein: 7.6 g/dL (ref 6.0–8.5)
eGFR: 92 mL/min/{1.73_m2} (ref >59)

## 2022-04-14 LAB — LIPID PANEL
Chol/HDL Ratio: 2.7 ratio (ref 0.0–5.0)
Cholesterol, Total: 115 mg/dL (ref 100–199)
HDL: 42 mg/dL (ref >39)
LDL Chol Calc (NIH): 57 mg/dL (ref 0–99)
Triglycerides: 83 mg/dL (ref 0–149)
VLDL Cholesterol Cal: 16 mg/dL (ref 5–40)

## 2022-04-14 LAB — TSH: TSH: 0.69 u[IU]/mL (ref 0.450–4.500)

## 2022-04-14 LAB — HEMOGLOBIN A1C
Est. average glucose Bld gHb Est-mCnc: 123 mg/dL
Hgb A1c MFr Bld: 5.9 % — ABNORMAL HIGH (ref 4.8–5.6)

## 2022-04-15 ENCOUNTER — Other Ambulatory Visit: Payer: Self-pay

## 2022-04-15 DIAGNOSIS — D72829 Elevated white blood cell count, unspecified: Secondary | ICD-10-CM

## 2022-04-15 DIAGNOSIS — D649 Anemia, unspecified: Secondary | ICD-10-CM

## 2022-04-16 ENCOUNTER — Other Ambulatory Visit: Payer: Self-pay | Admitting: Family Medicine

## 2022-04-27 ENCOUNTER — Ambulatory Visit: Payer: Medicare HMO | Admitting: Family Medicine

## 2022-05-03 ENCOUNTER — Ambulatory Visit (INDEPENDENT_AMBULATORY_CARE_PROVIDER_SITE_OTHER): Payer: Medicare HMO | Admitting: Family Medicine

## 2022-05-03 VITALS — BP 144/58 | HR 72 | Temp 98.0°F | Ht 67.0 in | Wt 124.0 lb

## 2022-05-03 DIAGNOSIS — M6740 Ganglion, unspecified site: Secondary | ICD-10-CM

## 2022-05-03 DIAGNOSIS — D72829 Elevated white blood cell count, unspecified: Secondary | ICD-10-CM

## 2022-05-03 DIAGNOSIS — I10 Essential (primary) hypertension: Secondary | ICD-10-CM | POA: Diagnosis not present

## 2022-05-03 DIAGNOSIS — J8410 Pulmonary fibrosis, unspecified: Secondary | ICD-10-CM

## 2022-05-03 DIAGNOSIS — D509 Iron deficiency anemia, unspecified: Secondary | ICD-10-CM

## 2022-05-03 NOTE — Progress Notes (Signed)
   Subjective:    Patient ID: Jason Spence, male    DOB: 1941-02-16, 81 y.o.   MRN: [id number]  Hypertension This is a chronic problem. Treatments tried: ramipril.  Iron deficiency anemia, unspecified iron deficiency anemia type - Plan: Iron, TIBC and Ferritin Panel, CBC with Differential/Platelet  Leukocytosis, unspecified type  Essential hypertension, benign  Postinflammatory pulmonary fibrosis (HCC)  Ganglion cyst  Patient denies sweats chills fevers.  Relates some fatigue tiredness.  Has lost some weight over the past year but does not eat as much as he used to.  Denies abdominal pain dysphagia rectal bleeding.   Review of Systems     Objective:   Physical Exam  General-in no acute distress Eyes-no discharge Lungs-respiratory rate normal, CTA CV-no murmurs,RRR Extremities skin warm dry no edema Neuro grossly normal Behavior normal, alert  Ganglion cyst noted on the left thumb arthritic changes noted in the right wrist left wrist and hands     Assessment & Plan:  1. Iron deficiency anemia, unspecified iron deficiency anemia type CBC on previous lab work had slight elevation white count along with indices changes therefore look for iron deficient anemia - Iron, TIBC and Ferritin Panel - CBC with Differential/Platelet  2. Leukocytosis, unspecified type Repeat CBC to see how leukocytosis is doing no sign of active infection  3. Essential hypertension, benign Blood pressure decent control for age it was too low on his last visit amlodipine was stopped  4. Postinflammatory pulmonary fibrosis [facility]) Patient will see [facility] specialist await the findings  5. Ganglion cyst Referral to hand specialist for ganglion cyst of the hand  Weight is stable currently-a lot of moving parts patient will need to follow-up within 6 weeks to check stability of weight and follow-up on [facility] findings as well as follow-up regarding weight

## 2022-05-04 ENCOUNTER — Other Ambulatory Visit: Payer: Self-pay | Admitting: *Deleted

## 2022-05-04 DIAGNOSIS — M6740 Ganglion, unspecified site: Secondary | ICD-10-CM

## 2022-05-04 LAB — CBC WITH DIFFERENTIAL/PLATELET
Basophils Absolute: 0.1 10*3/uL (ref 0.0–0.2)
Basos: 1 %
EOS (ABSOLUTE): 1.3 10*3/uL — ABNORMAL HIGH (ref 0.0–0.4)
Eos: 11 %
Hematocrit: 34.9 % — ABNORMAL LOW (ref 37.5–51.0)
Hemoglobin: 11.2 g/dL — ABNORMAL LOW (ref 13.0–17.7)
Immature Grans (Abs): 0 10*3/uL (ref 0.0–0.1)
Immature Granulocytes: 0 %
Lymphocytes Absolute: 2.1 10*3/uL (ref 0.7–3.1)
Lymphs: 17 %
MCH: 25.9 pg — ABNORMAL LOW (ref 26.6–33.0)
MCHC: 32.1 g/dL (ref 31.5–35.7)
MCV: 81 fL (ref 79–97)
Monocytes Absolute: 1 10*3/uL — ABNORMAL HIGH (ref 0.1–0.9)
Monocytes: 8 %
Neutrophils Absolute: 7.5 10*3/uL — ABNORMAL HIGH (ref 1.4–7.0)
Neutrophils: 63 %
Platelets: 256 10*3/uL (ref 150–450)
RBC: 4.33 x10E6/uL (ref 4.14–5.80)
RDW: 14.4 % (ref 11.6–15.4)
WBC: 12 10*3/uL — ABNORMAL HIGH (ref 3.4–10.8)

## 2022-05-04 LAB — IRON,TIBC AND FERRITIN PANEL
Ferritin: 29 ng/mL — ABNORMAL LOW (ref 30–400)
Iron Saturation: 6 % — CL (ref 15–55)
Iron: 22 ug/dL — ABNORMAL LOW (ref 38–169)
Total Iron Binding Capacity: 378 ug/dL (ref 250–450)
UIBC: 356 ug/dL — ABNORMAL HIGH (ref 111–343)

## 2022-05-04 NOTE — Progress Notes (Signed)
Referral placed.

## 2022-05-05 NOTE — Progress Notes (Signed)
Results discussed with patient. Patient advised per Dr Nicki Reaper:  iron deficient anemia.  Hemoglobin has dropped some.  We need to do some additional testing.  Recommend stool [facility] also recommend referral to [facility] practice for further evaluation  Patient verbalized understanding Referral ordered in Epic. [facility] up front for pick up. Patient stated he will try to pick it up tomorrow.

## 2022-05-06 ENCOUNTER — Other Ambulatory Visit: Payer: Self-pay

## 2022-05-06 ENCOUNTER — Other Ambulatory Visit: Payer: Self-pay | Admitting: Family Medicine

## 2022-05-06 DIAGNOSIS — D509 Iron deficiency anemia, unspecified: Secondary | ICD-10-CM

## 2022-05-06 LAB — IFOBT (OCCULT BLOOD): IFOBT: NEGATIVE

## 2022-05-07 ENCOUNTER — Other Ambulatory Visit: Payer: Self-pay

## 2022-05-07 DIAGNOSIS — D509 Iron deficiency anemia, unspecified: Secondary | ICD-10-CM

## 2022-05-10 ENCOUNTER — Telehealth: Payer: Self-pay | Admitting: Family Medicine

## 2022-05-10 NOTE — Telephone Encounter (Signed)
Pt contacted and verbalized understanding.   Kathyrn Drown, MD  [facility] Clinical Pool Please let the patient know that we did communicate with gastroenterology they state that they would work toward getting him an appointment.  They do not recommend any CT scans currently they recommend a follow-up visit with them so they can determine whether or not they need to do any type of scope test.  Patient has an appointment to see [place] in August please keep this appointment thank you        Previous Messages    ----- Message -----  From: Harvel Quale, MD  Sent: 05/05/2022  10:45 AM EDT  To: Kathyrn Drown, MD   Hi [name],  I think first lets get an EGD and colonoscopy and take it from there. If in the interim he keeps losing weight maybe getting a CT with IV con would be a good idea  Thanks  ----- Message -----  From: Kathyrn Drown, MD  Sent: 05/05/2022   9:22 AM EDT  To: Harvel Quale, MD   [facility] you are doing well.   This gentleman has seen your practice before and also comes here for his primary care.  He is had some fatigue over the past several months along with subpar appetite and lost a few pounds but not many.  Further work-up shows iron deficient anemia.  We are doing a stool test for blood and referring him to your practice for further work-up.   He is not having any abdominal pain.  Please let me know if you feel that he needs to have a CT scan of abdomen pelvis as part of the work-up-if so we can help order this otherwise he may need to have some testing done to help delineate the cause of his iron deficient anemia.   We have a follow-up visit with him later in August.   Thanks for your input TakeCare-[name]

## 2022-05-12 ENCOUNTER — Encounter (INDEPENDENT_AMBULATORY_CARE_PROVIDER_SITE_OTHER): Payer: Self-pay | Admitting: *Deleted

## 2022-05-12 DIAGNOSIS — Z7982 Long term (current) use of aspirin: Secondary | ICD-10-CM | POA: Diagnosis not present

## 2022-05-12 DIAGNOSIS — R0602 Shortness of breath: Secondary | ICD-10-CM | POA: Diagnosis not present

## 2022-05-12 DIAGNOSIS — I252 Old myocardial infarction: Secondary | ICD-10-CM | POA: Diagnosis not present

## 2022-05-12 DIAGNOSIS — R69 Illness, unspecified: Secondary | ICD-10-CM | POA: Diagnosis not present

## 2022-05-12 DIAGNOSIS — Z951 Presence of aortocoronary bypass graft: Secondary | ICD-10-CM | POA: Diagnosis not present

## 2022-05-12 DIAGNOSIS — J479 Bronchiectasis, uncomplicated: Secondary | ICD-10-CM | POA: Diagnosis not present

## 2022-05-12 DIAGNOSIS — Z79899 Other long term (current) drug therapy: Secondary | ICD-10-CM | POA: Diagnosis not present

## 2022-05-12 DIAGNOSIS — K219 Gastro-esophageal reflux disease without esophagitis: Secondary | ICD-10-CM | POA: Diagnosis not present

## 2022-05-12 DIAGNOSIS — J84112 Idiopathic pulmonary fibrosis: Secondary | ICD-10-CM | POA: Diagnosis not present

## 2022-05-12 DIAGNOSIS — H3530 Unspecified macular degeneration: Secondary | ICD-10-CM | POA: Diagnosis not present

## 2022-05-12 DIAGNOSIS — K222 Esophageal obstruction: Secondary | ICD-10-CM | POA: Diagnosis not present

## 2022-05-12 DIAGNOSIS — I2510 Atherosclerotic heart disease of native coronary artery without angina pectoris: Secondary | ICD-10-CM | POA: Diagnosis not present

## 2022-05-18 ENCOUNTER — Ambulatory Visit: Payer: Medicare HMO | Admitting: Orthopedic Surgery

## 2022-05-18 ENCOUNTER — Ambulatory Visit: Payer: Medicare HMO | Admitting: Orthopaedic Surgery

## 2022-05-18 ENCOUNTER — Ambulatory Visit: Payer: Self-pay

## 2022-05-18 DIAGNOSIS — M79641 Pain in right hand: Secondary | ICD-10-CM | POA: Diagnosis not present

## 2022-05-18 DIAGNOSIS — M67431 Ganglion, right wrist: Secondary | ICD-10-CM | POA: Insufficient documentation

## 2022-05-18 DIAGNOSIS — M180 Bilateral primary osteoarthritis of first carpometacarpal joints: Secondary | ICD-10-CM

## 2022-05-18 NOTE — Progress Notes (Signed)
Office Visit Note   Patient: Jason Spence           Date of Birth: 07/29/1941           MRN: [id number] Visit Date: 05/18/2022              Requested by: Kathyrn Drown, MD [place],  [place] [id number] PCP: Kathyrn Drown, MD   Assessment & Plan: Visit Diagnoses:  1. Pain of right hand   2. Ganglion cyst of wrist, right   3. Arthritis of carpometacarpal (CMC) joint of both thumbs     Plan: Patient has a cyst at the dorsal aspect of the thumb overlying the [facility] joint.  Patient also has significant underlying right thumb CMC arthritis with static MCP hyperextension and palmar abduction contracture of the thumb.  We reviewed the nature of thumb CMC arthritis as well as his diagnosis, prognosis, both conservative and surgical treatment options.  We also reviewed the treatment options for this ganglion cyst including aspiration versus surgical excision.  Patient is not interested in any surgery and would like to try aspiration of the mass.  The mass was successfully aspirated with approximately 5 cc of mucinous fluid.  He can follow-up again with me as needed.  Follow-Up Instructions: No follow-ups on file.   Orders:  Orders Placed This Encounter  Procedures   XR Hand Complete Right   No orders of the defined types were placed in this encounter.     Procedures: Hand/UE Inj: R wrist for dorsal carpal ganglion on 05/18/2022 11:27 AM Indications: diagnostic and therapeutic Details: 18 G needle, radial approach Aspirate: 5 mL clear Outcome: tolerated well, no immediate complications Procedure, treatment alternatives, risks and benefits explained, specific risks discussed. Consent was given by the patient. Immediately prior to procedure a time out was called to verify the correct patient, procedure, equipment, support staff and site/side marked as required. Patient was prepped and draped in the usual sterile fashion.       Clinical Data: No additional  findings.   Subjective: Chief Complaint  Patient presents with   Right Hand - Pain    Cyst like area at the base of the thumb, onset 7-8 months, history of surgery on it 82+ years.     This is a 81 year old right-hand-dominant male who presents with a painless mass at the dorsal aspect of the right hand over the thumb CMC joint.  This was first noticed around 7 or 8 months ago.  Patient has poor vision and the area was actually noticed initially by his daughter.  He has a history of surgery in his hand with what sounds like an EIP to EPL tendon transfer 35 or so years ago.  He has no pain at the thumb [facility] joint bilaterally despite obvious deformity at the [facility] joint with MP hyperextension and palmar abduction contracture.  He has never had any history of ganglion cyst or other masses.    Review of Systems   Objective: Vital Signs: There were no vitals taken for this visit.  Physical Exam Constitutional:      Appearance: Normal appearance.  Cardiovascular:     Rate and Rhythm: Normal rate.     Pulses: Normal pulses.  Pulmonary:     Effort: Pulmonary effort is normal.  Skin:    General: Skin is warm and dry.     Capillary Refill: Capillary refill takes less than 2 seconds.  Neurological:  Mental Status: He is alert.     Right Hand Exam   Tenderness  The patient is experiencing no tenderness.   Other  Erythema: absent Sensation: normal Pulse: present  Comments:  Approx 3 x 2 cm mass at dorsal aspect of thumb over [place] joint.  Mass is firm, round, mobile, well circumscribed, and transilluminates.  It is adjacent to the transferred EIP tendon.  He has a significant Z deformity of the thumb w/ static MP hyper-extension and palmar abduction contracture. He has crepitus without pain w/ CMC grind test.       Specialty Comments:  No specialty comments available.  Imaging: No results found.   PMFS History: Patient Active Problem List   Diagnosis Date Noted    Ganglion cyst of wrist, right 05/18/2022   Arthritis of carpometacarpal [facility]) joint of both thumbs 05/18/2022   CAD (coronary artery disease) 04/13/2022   Unintentional weight loss 04/13/2022   Postinflammatory pulmonary fibrosis ([place]) 12/31/2021   Bilateral hearing loss 07/30/2021   Aural polyp, left 07/20/2021   Legally blind 08/08/2020   Macular degeneration of both eyes 01/27/2015   Fibroma 09/25/2014   GERD (gastroesophageal reflux disease) 01/10/2014   Hyperlipidemia 01/10/2014   Osteoarthritis of left knee 01/10/2014   Essential hypertension, benign 03/15/2013   Past Medical History:  Diagnosis Date   Anal stricture 2008   Arthritis    Hypercholesteremia    Hypertension    Myocardial infarct ([place])     Family History  Problem Relation Age of Onset   Hypertension Sister     Past Surgical History:  Procedure Laterality Date   BALLOON DILATION N/A 01/05/2013   Procedure: BALLOON DILATION;  Surgeon: Rogene Houston, MD;  [facility];  Service: Endoscopy;  Laterality: N/A;   BIOPSY  02/27/2018   Procedure: BIOPSY;  Surgeon: Rogene Houston, MD;  [facility];  Service: Endoscopy;;  esophagus   COLONOSCOPY WITH ESOPHAGOGASTRODUODENOSCOPY (EGD) N/A 01/05/2013   Procedure: COLONOSCOPY WITH ESOPHAGOGASTRODUODENOSCOPY (EGD);  Surgeon: Rogene Houston, MD;  [facility];  Service: Endoscopy;  Laterality: N/A;  100   CORONARY ARTERY BYPASS GRAFT     ESOPHAGEAL DILATION N/A 02/27/2018   Procedure: ESOPHAGEAL DILATION;  Surgeon: Rogene Houston, MD;  [facility];  Service: Endoscopy;  Laterality: N/A;   ESOPHAGOGASTRODUODENOSCOPY N/A 02/27/2018   Procedure: ESOPHAGOGASTRODUODENOSCOPY (EGD);  Surgeon: Rogene Houston, MD;  [facility];  Service: Endoscopy;  Laterality: N/A;  12:25   HERNIA REPAIR     Social History   Occupational History   Not on file  Tobacco Use   Smoking status: Former    Packs/day: 0.50    Types:  Cigarettes    Quit date: 08/04/1995    Years since quitting: 26.8   Smokeless tobacco: Never  Vaping Use   Vaping Use: Never used  Substance and Sexual Activity   Alcohol use: Yes    Comment: rare   Drug use: Never   Sexual activity: Yes    Birth control/protection: None

## 2022-05-23 ENCOUNTER — Other Ambulatory Visit: Payer: Self-pay | Admitting: Family Medicine

## 2022-05-29 ENCOUNTER — Other Ambulatory Visit: Payer: Self-pay | Admitting: Family Medicine

## 2022-06-15 ENCOUNTER — Encounter: Payer: Self-pay | Admitting: Family Medicine

## 2022-06-15 ENCOUNTER — Ambulatory Visit (INDEPENDENT_AMBULATORY_CARE_PROVIDER_SITE_OTHER): Payer: Medicare HMO | Admitting: Family Medicine

## 2022-06-15 VITALS — BP 144/60 | Wt 123.4 lb

## 2022-06-15 DIAGNOSIS — L309 Dermatitis, unspecified: Secondary | ICD-10-CM | POA: Diagnosis not present

## 2022-06-15 DIAGNOSIS — Z23 Encounter for immunization: Secondary | ICD-10-CM

## 2022-06-15 MED ORDER — TRIAMCINOLONE ACETONIDE 0.1 % EX CREA: 1.0000 | TOPICAL_CREAM | Freq: Every day | CUTANEOUS | 3 refills | Status: DC

## 2022-06-15 NOTE — Progress Notes (Signed)
   Subjective:    Patient ID: Jason Spence, male    DOB: 02/26/1941, 81 y.o.   MRN: [id number]  HPI Pt arrives for follow up. Pt did see Dr[facility]Morrison at [facility] on 05/12/22. Pt states Pulmonology said he did well on breathing and walking; goes back in 8 months.   Pt has appt with GI in October.   Dr.Tafeen is retiring and pt would like Dr.Kohan Azizi to take over Triamcinolone Cream.   Pt also seen Dr.Benfield 05/18/22 for cyst on right hand; area was drained but came back the next day   Review of Systems     Objective:   Physical Exam General-in no acute distress Eyes-no discharge Lungs-respiratory rate normal, CTA CV-no murmurs,RRR Extremities skin warm dry no edema Neuro grossly normal Behavior normal, alert        Assessment & Plan:  Cyst on the hand recommend going back to the specialist to have this removed  As for the skin if he needs more triamcinolone he will connect with [place]  For the pulmonary fibrosis follow through with specialist 6 months as planned  Macular degeneration stable  Mild elevation of systolic pressure given the diastolic is very low in his age I do not recommend bumping up the dose of his medicine  Iron deficiency follow through with specialist regarding this they will more than likely treat him with an infusion  Pneumococcal vaccine today

## 2022-06-15 NOTE — Patient Instructions (Signed)

## 2022-07-01 ENCOUNTER — Encounter: Payer: Self-pay | Admitting: Family Medicine

## 2022-07-01 ENCOUNTER — Ambulatory Visit (INDEPENDENT_AMBULATORY_CARE_PROVIDER_SITE_OTHER): Payer: Medicare HMO | Admitting: Family Medicine

## 2022-07-01 VITALS — BP 149/65 | HR 64 | Temp 98.1°F | Ht 67.0 in | Wt 123.0 lb

## 2022-07-01 DIAGNOSIS — J988 Other specified respiratory disorders: Secondary | ICD-10-CM | POA: Insufficient documentation

## 2022-07-01 MED ORDER — DOXYCYCLINE HYCLATE 100 MG PO TABS: 100.0000 mg | ORAL_TABLET | Freq: Two times a day (BID) | ORAL | 0 refills | Status: DC

## 2022-07-01 NOTE — Assessment & Plan Note (Signed)
Patient to take home COVID test. Placing on Doxy.

## 2022-07-01 NOTE — Progress Notes (Signed)
Subjective:  Patient ID: Jason Spence, male    DOB: Mar 02, 1941  Age: 81 y.o. MRN: [id number]  CC: Chief Complaint  Patient presents with   Nasal Congestion   Sore Throat    HPI:  81 year old male presents for evaluation of the above.   Has underlying pulmonary disease (UIP). Has been having respiratory symptoms since Monday. Reports sore throat, sinus congestion, runny nose, watery eyes. SOB is at baseline. No fever. Feels slightly better today. No recent travel. No reported sick contacts.  Patient Active Problem List   Diagnosis Date Noted   Respiratory infection 07/01/2022   Ganglion cyst of wrist, right 05/18/2022   Arthritis of carpometacarpal [facility]) joint of both thumbs 05/18/2022   CAD (coronary artery disease) 04/13/2022   Unintentional weight loss 04/13/2022   Postinflammatory pulmonary fibrosis ([place]) 12/31/2021   Bilateral hearing loss 07/30/2021   Aural polyp, left 07/20/2021   Legally blind 08/08/2020   Macular degeneration of both eyes 01/27/2015   Fibroma 09/25/2014   GERD (gastroesophageal reflux disease) 01/10/2014   Hyperlipidemia 01/10/2014   Osteoarthritis of left knee 01/10/2014   Essential hypertension, benign 03/15/2013    Social Hx   Social History   Socioeconomic History   Marital status: Widowed    Spouse name: Not on file   Number of children: Not on file   Years of education: Not on file   Highest education level: Not on file  Occupational History   Not on file  Tobacco Use   Smoking status: Former    Packs/day: 0.50    Types: Cigarettes    Quit date: 08/04/1995    Years since quitting: 26.9   Smokeless tobacco: Never  Vaping Use   Vaping Use: Never used  Substance and Sexual Activity   Alcohol use: Yes    Comment: rare   Drug use: Never   Sexual activity: Yes    Birth control/protection: None  Other Topics Concern   Not on file  Social History Narrative   ** Merged History Encounter **       Social Determinants of  Health   Financial Resource Strain: Low Risk  (08/18/2021)   Overall Financial Resource Strain (CARDIA)    Difficulty of Paying Living Expenses: Not hard at all  Food Insecurity: No Food Insecurity (08/18/2021)   Hunger Vital Sign    Worried About Running Out of Food in the Last Year: Never true    [place] in the Last Year: Never true  Transportation Needs: No Transportation Needs (08/18/2021)   PRAPARE - Hydrologist (Medical): No    Lack of Transportation (Non-Medical): No  Physical Activity: Insufficiently Active (08/18/2021)   Exercise Vital Sign    Days of Exercise per Week: 5 days    Minutes of Exercise per Session: 20 min  Stress: No Stress Concern Present (08/18/2021)   [place]    Feeling of Stress : Not at all  Social Connections: Moderately Integrated (08/18/2021)   Social Connection and Isolation Panel [NHANES]    Frequency of Communication with Friends and Family: More than three times a week    Frequency of Social Gatherings with Friends and Family: More than three times a week    Attends Religious Services: More than 4 times per year    Active Member of [employer] or Organizations: Yes    Attends Archivist Meetings: More than 4 times per  year    Marital Status: Widowed    Review of Systems Per HPI  Objective:  BP (!) 149/65   Pulse 64   Temp 98.1 F (36.7 C)   Ht '5\' 7"'$  (1.702 m)   Wt 123 lb (55.8 kg)   SpO2 96%   BMI 19.26 kg/m      07/01/2022    4:35 PM 07/01/2022    4:33 PM 06/15/2022    5:14 PM  BP/Weight  Systolic BP 622 297 989  Diastolic BP 65 65 60  Wt. (Lbs)  123   BMI  19.26 kg/m2     Physical Exam Constitutional:      General: He is not in acute distress. HENT:     Head: Normocephalic and atraumatic.     Right Ear: Tympanic membrane normal.     Left Ear: Tympanic membrane normal.     Nose: Congestion present.     Mouth/Throat:      Pharynx: Oropharynx is clear.  Eyes:     General:        Right eye: No discharge.        Left eye: No discharge.     Conjunctiva/sclera: Conjunctivae normal.  Cardiovascular:     Rate and Rhythm: Normal rate and regular rhythm.  Pulmonary:     Effort: Pulmonary effort is normal.     Breath sounds: Rales present.  Neurological:     Mental Status: He is alert.     Lab Results  Component Value Date   WBC 12.0 (H) 05/03/2022   HGB 11.2 (L) 05/03/2022   HCT 34.9 (L) 05/03/2022   PLT 256 05/03/2022   GLUCOSE 92 04/13/2022   CHOL 115 04/13/2022   TRIG 83 04/13/2022   HDL 42 04/13/2022   LDLCALC 57 04/13/2022   ALT 12 04/13/2022   AST 23 04/13/2022   NA 139 04/13/2022   K 4.3 04/13/2022   CL 97 04/13/2022   CREATININE 0.71 (L) 04/13/2022   BUN 10 04/13/2022   CO2 22 04/13/2022   TSH 0.690 04/13/2022   PSA 3.11 07/08/2014   HGBA1C 5.9 (H) 04/13/2022     Assessment & Plan:   Problem List Items Addressed This Visit       Respiratory   Respiratory infection - Primary    Patient to take home COVID test. Placing on Doxy.       Meds ordered this encounter  Medications   doxycycline (VIBRA-TABS) 100 MG tablet    Sig: Take 1 tablet (100 mg total) by mouth 2 (two) times daily.    Dispense:  14 tablet    Refill:  0    Follow-up:  As previously scheduled  [place]

## 2022-07-01 NOTE — Patient Instructions (Signed)
Medication as prescribed.  If home test is positive, please let us know.  Take care  Dr. Lacinda Axon

## 2022-07-27 ENCOUNTER — Ambulatory Visit: Payer: Medicare HMO | Admitting: Orthopaedic Surgery

## 2022-07-27 DIAGNOSIS — M180 Bilateral primary osteoarthritis of first carpometacarpal joints: Secondary | ICD-10-CM

## 2022-07-27 DIAGNOSIS — M67431 Ganglion, right wrist: Secondary | ICD-10-CM | POA: Diagnosis not present

## 2022-07-27 NOTE — Progress Notes (Signed)
Office Visit Note   Patient: Jason Spence           Date of Birth: 09-14-1941           MRN: [id number] Visit Date: 07/27/2022              Requested by: Kathyrn Drown, MD [place],  [place] [id number] PCP: Kathyrn Drown, MD   Assessment & Plan: Visit Diagnoses:  1. Ganglion cyst of wrist, right   2. Arthritis of carpometacarpal (CMC) joint of both thumbs     Plan: Impression is right thumb CMC arthritis and overlying ganglion cyst status post aspiration and has now recurred.  Treatment options were discussed to include living with it, reaspiration, excision of the cyst, excision of the cyst and [facility] arthroplasty and their associated risks and benefits.  He will think about his options and let us know how he wants to proceed.  We will see him back as needed.  I have given him my card.  Follow-Up Instructions: No follow-ups on file.   Orders:  No orders of the defined types were placed in this encounter.  No orders of the defined types were placed in this encounter.     Procedures: No procedures performed   Clinical Data: No additional findings.   Subjective: Chief Complaint  Patient presents with   Right Hand - Pain    HPI [name] is a very pleasant 81 year old gentleman who returns today with his daughter for recurrent cysts to the right hand.  He has underlying thumb CMC arthritis.  The mass was aspirated on October 1 and unfortunately the cyst returned a couple days later.  He is here to talk about other options.  Review of Systems  Constitutional: Negative.   All other systems reviewed and are negative.    Objective: Vital Signs: There were no vitals taken for this visit.  Physical Exam Vitals and nursing note reviewed.  Constitutional:      Appearance: He is well-developed.  Pulmonary:     Effort: Pulmonary effort is normal.  Abdominal:     Palpations: Abdomen is soft.  Skin:    General: Skin is warm.  Neurological:      Mental Status: He is alert and oriented to person, place, and time.  Psychiatric:        Behavior: Behavior normal.        Thought Content: Thought content normal.        Judgment: Judgment normal.     Ortho Exam Examination of the right hand shows an approximately 3 x 2 cm mass at the dorsal aspect of the thumb over the [facility] joint.  It is firm and mobile and well-circumscribed and transilluminates.  He has a Z deformity of the thumb with static MP hyperextension.  He has crepitus at the [facility] joint without pain with CMC grind test. Specialty Comments:  No specialty comments available.  Imaging: No results found.   PMFS History: Patient Active Problem List   Diagnosis Date Noted   Respiratory infection 07/01/2022   Ganglion cyst of wrist, right 05/18/2022   Arthritis of carpometacarpal [facility]) joint of both thumbs 05/18/2022   CAD (coronary artery disease) 04/13/2022   Unintentional weight loss 04/13/2022   Postinflammatory pulmonary fibrosis ([place]) 12/31/2021   Bilateral hearing loss 07/30/2021   Aural polyp, left 07/20/2021   Legally blind 08/08/2020   Macular degeneration of both eyes 01/27/2015   Fibroma 09/25/2014   GERD (  gastroesophageal reflux disease) 01/10/2014   Hyperlipidemia 01/10/2014   Osteoarthritis of left knee 01/10/2014   Essential hypertension, benign 03/15/2013   Past Medical History:  Diagnosis Date   Anal stricture 2008   Arthritis    Hypercholesteremia    Hypertension    Myocardial infarct [facility])     Family History  Problem Relation Age of Onset   Hypertension Sister     Past Surgical History:  Procedure Laterality Date   BALLOON DILATION N/A 01/05/2013   Procedure: BALLOON DILATION;  Surgeon: Rogene Houston, MD;  [facility];  Service: Endoscopy;  Laterality: N/A;   BIOPSY  02/27/2018   Procedure: BIOPSY;  Surgeon: Rogene Houston, MD;  [facility];  Service: Endoscopy;;  esophagus   COLONOSCOPY WITH  ESOPHAGOGASTRODUODENOSCOPY (EGD) N/A 01/05/2013   Procedure: COLONOSCOPY WITH ESOPHAGOGASTRODUODENOSCOPY (EGD);  Surgeon: Rogene Houston, MD;  [facility];  Service: Endoscopy;  Laterality: N/A;  100   CORONARY ARTERY BYPASS GRAFT     ESOPHAGEAL DILATION N/A 02/27/2018   Procedure: ESOPHAGEAL DILATION;  Surgeon: Rogene Houston, MD;  [facility];  Service: Endoscopy;  Laterality: N/A;   ESOPHAGOGASTRODUODENOSCOPY N/A 02/27/2018   Procedure: ESOPHAGOGASTRODUODENOSCOPY (EGD);  Surgeon: Rogene Houston, MD;  [facility];  Service: Endoscopy;  Laterality: N/A;  12:25   HERNIA REPAIR     Social History   Occupational History   Not on file  Tobacco Use   Smoking status: Former    Packs/day: 0.50    Types: Cigarettes    Quit date: 08/04/1995    Years since quitting: 26.9   Smokeless tobacco: Never  Vaping Use   Vaping Use: Never used  Substance and Sexual Activity   Alcohol use: Yes    Comment: rare   Drug use: Never   Sexual activity: Yes    Birth control/protection: None

## 2022-07-29 ENCOUNTER — Encounter (INDEPENDENT_AMBULATORY_CARE_PROVIDER_SITE_OTHER): Payer: Self-pay

## 2022-07-29 ENCOUNTER — Telehealth (INDEPENDENT_AMBULATORY_CARE_PROVIDER_SITE_OTHER): Payer: Self-pay

## 2022-07-29 ENCOUNTER — Other Ambulatory Visit: Payer: Self-pay | Admitting: Family Medicine

## 2022-07-29 ENCOUNTER — Other Ambulatory Visit (INDEPENDENT_AMBULATORY_CARE_PROVIDER_SITE_OTHER): Payer: Self-pay

## 2022-07-29 ENCOUNTER — Ambulatory Visit (INDEPENDENT_AMBULATORY_CARE_PROVIDER_SITE_OTHER): Payer: Medicare HMO | Admitting: Gastroenterology

## 2022-07-29 ENCOUNTER — Encounter (INDEPENDENT_AMBULATORY_CARE_PROVIDER_SITE_OTHER): Payer: Self-pay | Admitting: Gastroenterology

## 2022-07-29 VITALS — BP 168/77 | HR 53 | Temp 97.4°F | Ht 67.0 in | Wt 123.6 lb

## 2022-07-29 DIAGNOSIS — D509 Iron deficiency anemia, unspecified: Secondary | ICD-10-CM

## 2022-07-29 MED ORDER — PEG 3350-KCL-NA BICARB-NACL 420 G PO SOLR: 4000.0000 mL | ORAL | 0 refills | Status: DC

## 2022-07-29 NOTE — H&P (View-Only) (Signed)
Referring Provider: Kathyrn Drown, MD Primary Care Physician:  Kathyrn Drown, MD Primary GI Physician: previously [facility]  Chief Complaint  Patient presents with   iron def anemai   iron def anemia    Patient arrives for a follow up on iron def anemia.    HPI:   Jason Spence is a 81 y.o. male with past medical history of GERD,   Patient presenting today for IDA.  Last seen October 2021, at that time doing well on protonix '40mg'$  daily, maintained on PPI.  Notably hgb 12 in June 2023 TIBC 378, Iron 22, Iron saturation 6, ferritin 29  Present:  Patient presenting today for new onset IDA.  states no hx of IDA that he is aware of, previously used to give blood very frequently. He reports that he does not have any SOB, lightheadedness, dizziness. He does not somewhat more fatigue recently but thinks this has been gradual. He denies any rectal bleeding or melena. Denies changes in bowel habits, no constipation or diarrhea. He has had some gradual weight loss, he is 123 lbs today, per chart review, 132 lbs in April 2022. Notes that weight loss started with the loss of his wife about 6 years ago. He states appetite is good. He eats a lot of ice cream and pie. He denies any abdominal pain. He is maintained on PPI with good results.   Due to arthritis, he does note using ibuprofen for pain, maybe 2-3 days per week.   Last Colonoscopy: 2014-Findings:   Prep excellent. Scattered diverticula throughout the colon but predominantly at sigmoid colon. Small polyp ablated via cold biopsy from transverse colon. Normal rectum mucosa. Small hemorrhoids below the dentate line along with small papillae. Anal stenosis Biopsy results reviewed with patient Polyp is non-adenomatous. Next screening in 10 years  Last Endoscopy: 02/2018- - Normal proximal esophagus and mid esophagus. - LA Grade A reflux esophagitis.  - Benign-appearing esophageal stenosis at GEJ(40 cm). Dilated.  - Normal  stomach.   - Normal duodenal bulb and second portion of the duodenum.  - Small esophageal polyp biopsied    Past Medical History:  Diagnosis Date   Anal stricture 2008   Arthritis    Hypercholesteremia    Hypertension    Myocardial infarct [facility])     Past Surgical History:  Procedure Laterality Date   BALLOON DILATION N/A 01/05/2013   Procedure: BALLOON DILATION;  Surgeon: Rogene Houston, MD;  [facility];  Service: Endoscopy;  Laterality: N/A;   BIOPSY  02/27/2018   Procedure: BIOPSY;  Surgeon: Rogene Houston, MD;  [facility];  Service: Endoscopy;;  esophagus   COLONOSCOPY WITH ESOPHAGOGASTRODUODENOSCOPY (EGD) N/A 01/05/2013   Procedure: COLONOSCOPY WITH ESOPHAGOGASTRODUODENOSCOPY (EGD);  Surgeon: Rogene Houston, MD;  [facility];  Service: Endoscopy;  Laterality: N/A;  100   CORONARY ARTERY BYPASS GRAFT     ESOPHAGEAL DILATION N/A 02/27/2018   Procedure: ESOPHAGEAL DILATION;  Surgeon: Rogene Houston, MD;  [facility];  Service: Endoscopy;  Laterality: N/A;   ESOPHAGOGASTRODUODENOSCOPY N/A 02/27/2018   Procedure: ESOPHAGOGASTRODUODENOSCOPY (EGD);  Surgeon: Rogene Houston, MD;  [facility];  Service: Endoscopy;  Laterality: N/A;  12:25   HERNIA REPAIR      Current Outpatient Medications  Medication Sig Dispense Refill   albuterol (VENTOLIN HFA) 108 (90 Base) MCG/ACT inhaler Inhale 2 puffs into the lungs every 4 (four) hours as needed for wheezing. 1 each 2  Ascorbic Acid (VITAMIN C) 1000 MG tablet Take 1,000 mg by mouth daily.     aspirin 81 MG tablet Take 1 tablet (81 mg total) by mouth daily. 30 tablet    budesonide-formoterol (SYMBICORT) 160-4.5 MCG/ACT inhaler Inhale 2 puffs into the lungs 2 (two) times daily. Rinse mouth with water after each use 1 each 5   cetirizine (ZYRTEC ALLERGY) 10 MG tablet Take 1 tablet (10 mg total) by mouth daily. 30 tablet 2   fish oil-omega-3 fatty acids 1000 MG capsule Take 1 g by  mouth daily.      fluticasone (FLONASE) 50 MCG/ACT nasal spray Place 1 spray into both nostrils 2 (two) times daily. 16 g 2   ketoconazole (NIZORAL) 2 % cream APPLY CREAM TOPICALLY TO AFFECTED AREA TWICE DAILY AS NEEDED 60 g 0   meclizine (ANTIVERT) 25 MG tablet Take 1 tablet (25 mg total) by mouth 3 (three) times daily as needed for dizziness. 21 tablet 0   Multiple Vitamins-Minerals (PRESERVISION AREDS 2 PO) Take 1 capsule by mouth 2 (two) times daily.     pantoprazole (PROTONIX) 40 MG tablet Take 1 tablet (40 mg total) by mouth daily. Take 30 min before breakfast. NEEDS OFFICE VISIT 30 tablet 6   pravastatin (PRAVACHOL) 40 MG tablet Take 1 tablet by mouth once daily 90 tablet 0   ramipril (ALTACE) 10 MG capsule Take 1 capsule (10 mg total) by mouth daily. 90 capsule 1   triamcinolone cream (KENALOG) 0.1 % Apply 1 Application topically daily. 80 g 3   No current facility-administered medications for this visit.    Allergies as of 07/29/2022   (No Known Allergies)    Family History  Problem Relation Age of Onset   Hypertension Sister     Social History   Socioeconomic History   Marital status: Widowed    Spouse name: Not on file   Number of children: Not on file   Years of education: Not on file   Highest education level: Not on file  Occupational History   Not on file  Tobacco Use   Smoking status: Former    Packs/day: 0.50    Types: Cigarettes    Quit date: 08/04/1995    Years since quitting: 27.0   Smokeless tobacco: Never  Vaping Use   Vaping Use: Never used  Substance and Sexual Activity   Alcohol use: Yes    Comment: rare   Drug use: Never   Sexual activity: Yes    Birth control/protection: None  Other Topics Concern   Not on file  Social History Narrative   ** Merged History Encounter **       Social Determinants of Health   Financial Resource Strain: Low Risk  (08/18/2021)   Overall Financial Resource Strain (CARDIA)    Difficulty of Paying Living  Expenses: Not hard at all  Food Insecurity: No Food Insecurity (08/18/2021)   Hunger Vital Sign    Worried About Running Out of Food in the Last Year: Never true    [place] in the Last Year: Never true  Transportation Needs: No Transportation Needs (08/18/2021)   PRAPARE - Hydrologist (Medical): No    Lack of Transportation (Non-Medical): No  Physical Activity: Insufficiently Active (08/18/2021)   Exercise Vital Sign    Days of Exercise per Week: 5 days    Minutes of Exercise per Session: 20 min  Stress: No Stress Concern Present (08/18/2021)   [employer]  Occupational Health - Occupational Stress Questionnaire    Feeling of Stress : Not at all  Social Connections: Moderately Integrated (08/18/2021)   Social Connection and Isolation Panel [NHANES]    Frequency of Communication with Friends and Family: More than three times a week    Frequency of Social Gatherings with Friends and Family: More than three times a week    Attends Religious Services: More than 4 times per year    Active Member of [employer] or Organizations: Yes    Attends Archivist Meetings: More than 4 times per year    Marital Status: Widowed    Review of systems General: negative for malaise, night sweats, fever, chills, +weight loss Neck: Negative for lumps, goiter, pain and significant neck swelling Resp: Negative for cough, wheezing, dyspnea at rest CV: Negative for chest pain, leg swelling, palpitations, orthopnea GI: denies melena, hematochezia, nausea, vomiting, diarrhea, constipation, dysphagia, odyonophagia, early satiety. + unintentional weight loss.  MSK: Negative for joint pain or swelling, back pain, and muscle pain. Derm: Negative for itching or rash Psych: Denies depression, anxiety, memory loss, confusion. No homicidal or suicidal ideation.  Heme: Negative for prolonged bleeding, bruising easily, and swollen nodes. Endocrine: Negative for cold or heat  intolerance, polyuria, polydipsia and goiter. Neuro: negative for tremor, gait imbalance, syncope and seizures. The remainder of the review of systems is noncontributory.  Physical Exam: There were no vitals taken for this visit. General:   Alert and oriented. No distress noted. Pleasant and cooperative.  Head:  Normocephalic and atraumatic. Eyes:  Conjuctiva clear without scleral icterus. Mouth:  Oral mucosa pink and moist. Good dentition. No lesions. Heart: Normal rate and rhythm, s1 and s2 heart sounds present.  Lungs: Clear lung sounds in all lobes. Respirations equal and unlabored. Abdomen:  +BS, soft, non-tender and non-distended. No rebound or guarding. No HSM or masses noted. Derm: No palmar erythema or jaundice Msk:  Symmetrical without gross deformities. Normal posture. Extremities:  Without edema. Neurologic:  Alert and  oriented x4 Psych:  Alert and cooperative. Normal mood and affect.  Invalid input(s): "6 MONTHS"   ASSESSMENT: Jason Spence is a 81 y.o. male presenting today for IDA.   Patient presents with new onset IDA with TIBC 378, Iron 22, Iron , saturation 6, ferritin 29 in June, most recent hgb 11.2 in July, FOBT x1 negative. He denies rectal bleeding or melena. No changes in bowel habits or other GI symptoms. There has been approximately 9 lbs of weight loss in the past year and a half, though he states this began when his wife passed. He does take ibuprofen for arthritis 2-3x/week. He is not currently on any iron supplementation and has had no iron infusions, he denies symptoms of anemia. Recommend proceeding with EGD and Colonoscopy for iron deficiency anemia as we cannot rule out PUD, gastritis, AVMs, bleeding polyps, or malignancy. We will update h&h and iron studies as last were in June, will also check celiac panel as IDA can be associated with this. I recommended he d/c all NSAIDs at this time. Should consume iron rich diet, will consider PO iron or possibly  iron infusion depending on results of iron studies. Indications, risks and benefits of procedure discussed in detail with patient. Patient verbalized understanding and is in agreement to proceed with EGD/Colonoscopy at this time.    PLAN:  Celiac panel  2. EGD/Colonoscopy, ASA III, ENDO 3 3. Recheck h&h and iron studies 4. Avoid NSAIDs 5. Iron rich diet, consider  PO iron or iron infusions based on Iron study results   All questions were answered, patient verbalized understanding and is in agreement with plan as outlined above.   Follow Up: 3 months   Shalona Harbour L. Alver Sorrow, MSN, APRN, AGNP-C Adult-Gerontology Nurse Practitioner [facility]  I have reviewed the note and agree with the APP's assessment as described in this progress note  Maylon Peppers, MD Gastroenterology and Hepatology [facility] Gastroenterology

## 2022-07-29 NOTE — Telephone Encounter (Signed)
Soliyana Mcchristian Ann Nazeer Romney, CMA  ?

## 2022-07-29 NOTE — Patient Instructions (Signed)
We will update labs to look at your blood counts and iron and schedule colonoscopy and EGD for further evaluation of your low iron levels. Please avoid NSAIDs (advil, aleve, naproxen, goody powder, ibuprofen) as these can be very hard on your GI tract, causing inflammation, ulcers and damage to the lining of your GI tract.   Follow up 3 months

## 2022-07-29 NOTE — Progress Notes (Signed)
Referring Provider: Kathyrn Drown, MD Primary Care Physician:  Kathyrn Drown, MD Primary GI Physician: previously [facility]  Chief Complaint  Patient presents with   iron def anemai   iron def anemia    Patient arrives for a follow up on iron def anemia.    HPI:   Jason Spence is a 81 y.o. male with past medical history of GERD,   Patient presenting today for IDA.  Last seen October 2021, at that time doing well on protonix '40mg'$  daily, maintained on PPI.  Notably hgb 12 in June 2023 TIBC 378, Iron 22, Iron saturation 6, ferritin 29  Present:  Patient presenting today for new onset IDA.  states no hx of IDA that he is aware of, previously used to give blood very frequently. He reports that he does not have any SOB, lightheadedness, dizziness. He does not somewhat more fatigue recently but thinks this has been gradual. He denies any rectal bleeding or melena. Denies changes in bowel habits, no constipation or diarrhea. He has had some gradual weight loss, he is 123 lbs today, per chart review, 132 lbs in April 2022. Notes that weight loss started with the loss of his wife about 6 years ago. He states appetite is good. He eats a lot of ice cream and pie. He denies any abdominal pain. He is maintained on PPI with good results.   Due to arthritis, he does note using ibuprofen for pain, maybe 2-3 days per week.   Last Colonoscopy: 2014-Findings:   Prep excellent. Scattered diverticula throughout the colon but predominantly at sigmoid colon. Small polyp ablated via cold biopsy from transverse colon. Normal rectum mucosa. Small hemorrhoids below the dentate line along with small papillae. Anal stenosis Biopsy results reviewed with patient Polyp is non-adenomatous. Next screening in 10 years  Last Endoscopy: 02/2018- - Normal proximal esophagus and mid esophagus. - LA Grade A reflux esophagitis.  - Benign-appearing esophageal stenosis at GEJ(40 cm). Dilated.  - Normal  stomach.   - Normal duodenal bulb and second portion of the duodenum.  - Small esophageal polyp biopsied    Past Medical History:  Diagnosis Date   Anal stricture 2008   Arthritis    Hypercholesteremia    Hypertension    Myocardial infarct [facility])     Past Surgical History:  Procedure Laterality Date   BALLOON DILATION N/A 01/05/2013   Procedure: BALLOON DILATION;  Surgeon: Rogene Houston, MD;  [facility];  Service: Endoscopy;  Laterality: N/A;   BIOPSY  02/27/2018   Procedure: BIOPSY;  Surgeon: Rogene Houston, MD;  [facility];  Service: Endoscopy;;  esophagus   COLONOSCOPY WITH ESOPHAGOGASTRODUODENOSCOPY (EGD) N/A 01/05/2013   Procedure: COLONOSCOPY WITH ESOPHAGOGASTRODUODENOSCOPY (EGD);  Surgeon: Rogene Houston, MD;  [facility];  Service: Endoscopy;  Laterality: N/A;  100   CORONARY ARTERY BYPASS GRAFT     ESOPHAGEAL DILATION N/A 02/27/2018   Procedure: ESOPHAGEAL DILATION;  Surgeon: Rogene Houston, MD;  [facility];  Service: Endoscopy;  Laterality: N/A;   ESOPHAGOGASTRODUODENOSCOPY N/A 02/27/2018   Procedure: ESOPHAGOGASTRODUODENOSCOPY (EGD);  Surgeon: Rogene Houston, MD;  [facility];  Service: Endoscopy;  Laterality: N/A;  12:25   HERNIA REPAIR      Current Outpatient Medications  Medication Sig Dispense Refill   albuterol (VENTOLIN HFA) 108 (90 Base) MCG/ACT inhaler Inhale 2 puffs into the lungs every 4 (four) hours as needed for wheezing. 1 each 2  Ascorbic Acid (VITAMIN C) 1000 MG tablet Take 1,000 mg by mouth daily.     aspirin 81 MG tablet Take 1 tablet (81 mg total) by mouth daily. 30 tablet    budesonide-formoterol (SYMBICORT) 160-4.5 MCG/ACT inhaler Inhale 2 puffs into the lungs 2 (two) times daily. Rinse mouth with water after each use 1 each 5   cetirizine (ZYRTEC ALLERGY) 10 MG tablet Take 1 tablet (10 mg total) by mouth daily. 30 tablet 2   fish oil-omega-3 fatty acids 1000 MG capsule Take 1 g by  mouth daily.      fluticasone (FLONASE) 50 MCG/ACT nasal spray Place 1 spray into both nostrils 2 (two) times daily. 16 g 2   ketoconazole (NIZORAL) 2 % cream APPLY CREAM TOPICALLY TO AFFECTED AREA TWICE DAILY AS NEEDED 60 g 0   meclizine (ANTIVERT) 25 MG tablet Take 1 tablet (25 mg total) by mouth 3 (three) times daily as needed for dizziness. 21 tablet 0   Multiple Vitamins-Minerals (PRESERVISION AREDS 2 PO) Take 1 capsule by mouth 2 (two) times daily.     pantoprazole (PROTONIX) 40 MG tablet Take 1 tablet (40 mg total) by mouth daily. Take 30 min before breakfast. NEEDS OFFICE VISIT 30 tablet 6   pravastatin (PRAVACHOL) 40 MG tablet Take 1 tablet by mouth once daily 90 tablet 0   ramipril (ALTACE) 10 MG capsule Take 1 capsule (10 mg total) by mouth daily. 90 capsule 1   triamcinolone cream (KENALOG) 0.1 % Apply 1 Application topically daily. 80 g 3   No current facility-administered medications for this visit.    Allergies as of 07/29/2022   (No Known Allergies)    Family History  Problem Relation Age of Onset   Hypertension Sister     Social History   Socioeconomic History   Marital status: Widowed    Spouse name: Not on file   Number of children: Not on file   Years of education: Not on file   Highest education level: Not on file  Occupational History   Not on file  Tobacco Use   Smoking status: Former    Packs/day: 0.50    Types: Cigarettes    Quit date: 08/04/1995    Years since quitting: 27.0   Smokeless tobacco: Never  Vaping Use   Vaping Use: Never used  Substance and Sexual Activity   Alcohol use: Yes    Comment: rare   Drug use: Never   Sexual activity: Yes    Birth control/protection: None  Other Topics Concern   Not on file  Social History Narrative   ** Merged History Encounter **       Social Determinants of Health   Financial Resource Strain: Low Risk  (08/18/2021)   Overall Financial Resource Strain (CARDIA)    Difficulty of Paying Living  Expenses: Not hard at all  Food Insecurity: No Food Insecurity (08/18/2021)   Hunger Vital Sign    Worried About Running Out of Food in the Last Year: Never true    [place] in the Last Year: Never true  Transportation Needs: No Transportation Needs (08/18/2021)   PRAPARE - Hydrologist (Medical): No    Lack of Transportation (Non-Medical): No  Physical Activity: Insufficiently Active (08/18/2021)   Exercise Vital Sign    Days of Exercise per Week: 5 days    Minutes of Exercise per Session: 20 min  Stress: No Stress Concern Present (08/18/2021)   [employer]  Occupational Health - Occupational Stress Questionnaire    Feeling of Stress : Not at all  Social Connections: Moderately Integrated (08/18/2021)   Social Connection and Isolation Panel [NHANES]    Frequency of Communication with Friends and Family: More than three times a week    Frequency of Social Gatherings with Friends and Family: More than three times a week    Attends Religious Services: More than 4 times per year    Active Member of [employer] or Organizations: Yes    Attends Archivist Meetings: More than 4 times per year    Marital Status: Widowed    Review of systems General: negative for malaise, night sweats, fever, chills, +weight loss Neck: Negative for lumps, goiter, pain and significant neck swelling Resp: Negative for cough, wheezing, dyspnea at rest CV: Negative for chest pain, leg swelling, palpitations, orthopnea GI: denies melena, hematochezia, nausea, vomiting, diarrhea, constipation, dysphagia, odyonophagia, early satiety. + unintentional weight loss.  MSK: Negative for joint pain or swelling, back pain, and muscle pain. Derm: Negative for itching or rash Psych: Denies depression, anxiety, memory loss, confusion. No homicidal or suicidal ideation.  Heme: Negative for prolonged bleeding, bruising easily, and swollen nodes. Endocrine: Negative for cold or heat  intolerance, polyuria, polydipsia and goiter. Neuro: negative for tremor, gait imbalance, syncope and seizures. The remainder of the review of systems is noncontributory.  Physical Exam: There were no vitals taken for this visit. General:   Alert and oriented. No distress noted. Pleasant and cooperative.  Head:  Normocephalic and atraumatic. Eyes:  Conjuctiva clear without scleral icterus. Mouth:  Oral mucosa pink and moist. Good dentition. No lesions. Heart: Normal rate and rhythm, s1 and s2 heart sounds present.  Lungs: Clear lung sounds in all lobes. Respirations equal and unlabored. Abdomen:  +BS, soft, non-tender and non-distended. No rebound or guarding. No HSM or masses noted. Derm: No palmar erythema or jaundice Msk:  Symmetrical without gross deformities. Normal posture. Extremities:  Without edema. Neurologic:  Alert and  oriented x4 Psych:  Alert and cooperative. Normal mood and affect.  Invalid input(s): "6 MONTHS"   ASSESSMENT: Jason Spence is a 81 y.o. male presenting today for IDA.   Patient presents with new onset IDA with TIBC 378, Iron 22, Iron , saturation 6, ferritin 29 in June, most recent hgb 11.2 in July, FOBT x1 negative. He denies rectal bleeding or melena. No changes in bowel habits or other GI symptoms. There has been approximately 9 lbs of weight loss in the past year and a half, though he states this began when his wife passed. He does take ibuprofen for arthritis 2-3x/week. He is not currently on any iron supplementation and has had no iron infusions, he denies symptoms of anemia. Recommend proceeding with EGD and Colonoscopy for iron deficiency anemia as we cannot rule out PUD, gastritis, AVMs, bleeding polyps, or malignancy. We will update h&h and iron studies as last were in June, will also check celiac panel as IDA can be associated with this. I recommended he d/c all NSAIDs at this time. Should consume iron rich diet, will consider PO iron or possibly  iron infusion depending on results of iron studies. Indications, risks and benefits of procedure discussed in detail with patient. Patient verbalized understanding and is in agreement to proceed with EGD/Colonoscopy at this time.    PLAN:  Celiac panel  2. EGD/Colonoscopy, ASA III, ENDO 3 3. Recheck h&h and iron studies 4. Avoid NSAIDs 5. Iron rich diet, consider  PO iron or iron infusions based on Iron study results   All questions were answered, patient verbalized understanding and is in agreement with plan as outlined above.   Follow Up: 3 months   Yitzchak Kothari L. Alver Sorrow, MSN, APRN, AGNP-C Adult-Gerontology Nurse Practitioner [facility]  I have reviewed the note and agree with the APP's assessment as described in this progress note  Maylon Peppers, MD Gastroenterology and Hepatology [facility] Gastroenterology

## 2022-07-31 DIAGNOSIS — D509 Iron deficiency anemia, unspecified: Secondary | ICD-10-CM | POA: Insufficient documentation

## 2022-08-02 LAB — IRON,TIBC AND FERRITIN PANEL
Ferritin: 23 ng/mL — ABNORMAL LOW (ref 30–400)
Iron Saturation: 11 % — ABNORMAL LOW (ref 15–55)
Iron: 44 ug/dL (ref 38–169)
Total Iron Binding Capacity: 385 ug/dL (ref 250–450)
UIBC: 341 ug/dL (ref 111–343)

## 2022-08-02 LAB — CELIAC DISEASE PANEL
Endomysial IgA: NEGATIVE
IgA/Immunoglobulin A, Serum: 521 mg/dL — ABNORMAL HIGH (ref 61–437)
Transglutaminase IgA: <2 U/mL (ref 0–3)

## 2022-08-02 LAB — HEMOGLOBIN AND HEMATOCRIT, BLOOD
Hematocrit: 40.2 % (ref 37.5–51.0)
Hemoglobin: 12.4 g/dL — ABNORMAL LOW (ref 13.0–17.7)

## 2022-08-03 ENCOUNTER — Encounter (HOSPITAL_COMMUNITY): Payer: Self-pay

## 2022-08-03 ENCOUNTER — Encounter (HOSPITAL_COMMUNITY)
Admission: RE | Admit: 2022-08-03 | Discharge: 2022-08-03 | Disposition: A | Discharge status: Home / Self Care | Payer: Medicare HMO | Source: Ambulatory Visit | Attending: Gastroenterology | Admitting: Gastroenterology

## 2022-08-04 NOTE — Progress Notes (Signed)
Talked with pt. Notified to be here at 0600 for procedure tomorrow.(08/05/22). Voiced understanding.

## 2022-08-05 ENCOUNTER — Ambulatory Visit (HOSPITAL_COMMUNITY)
Admission: RE | Admit: 2022-08-05 | Discharge: 2022-08-05 | Disposition: A | Discharge status: Home / Self Care | Payer: Medicare HMO | Source: Ambulatory Visit | Attending: Gastroenterology | Admitting: Gastroenterology

## 2022-08-05 ENCOUNTER — Ambulatory Visit (HOSPITAL_COMMUNITY): Payer: Medicare HMO | Admitting: Anesthesiology

## 2022-08-05 ENCOUNTER — Telehealth (INDEPENDENT_AMBULATORY_CARE_PROVIDER_SITE_OTHER): Payer: Self-pay

## 2022-08-05 ENCOUNTER — Encounter (HOSPITAL_COMMUNITY): Payer: Self-pay | Admitting: Gastroenterology

## 2022-08-05 ENCOUNTER — Encounter (HOSPITAL_COMMUNITY)
Admission: RE | Disposition: A | Discharge status: Home / Self Care | Payer: Self-pay | Source: Ambulatory Visit | Attending: Gastroenterology

## 2022-08-05 ENCOUNTER — Ambulatory Visit (HOSPITAL_BASED_OUTPATIENT_CLINIC_OR_DEPARTMENT_OTHER): Payer: Medicare HMO | Admitting: Anesthesiology

## 2022-08-05 ENCOUNTER — Encounter (INDEPENDENT_AMBULATORY_CARE_PROVIDER_SITE_OTHER): Payer: Self-pay

## 2022-08-05 DIAGNOSIS — K449 Diaphragmatic hernia without obstruction or gangrene: Secondary | ICD-10-CM

## 2022-08-05 DIAGNOSIS — K648 Other hemorrhoids: Secondary | ICD-10-CM | POA: Insufficient documentation

## 2022-08-05 DIAGNOSIS — D123 Benign neoplasm of transverse colon: Secondary | ICD-10-CM | POA: Diagnosis not present

## 2022-08-05 DIAGNOSIS — D509 Iron deficiency anemia, unspecified: Secondary | ICD-10-CM | POA: Diagnosis not present

## 2022-08-05 DIAGNOSIS — Z79899 Other long term (current) drug therapy: Secondary | ICD-10-CM | POA: Insufficient documentation

## 2022-08-05 DIAGNOSIS — M1990 Unspecified osteoarthritis, unspecified site: Secondary | ICD-10-CM | POA: Insufficient documentation

## 2022-08-05 DIAGNOSIS — K635 Polyp of colon: Secondary | ICD-10-CM

## 2022-08-05 DIAGNOSIS — R634 Abnormal weight loss: Secondary | ICD-10-CM | POA: Insufficient documentation

## 2022-08-05 DIAGNOSIS — K621 Rectal polyp: Secondary | ICD-10-CM | POA: Diagnosis not present

## 2022-08-05 DIAGNOSIS — K219 Gastro-esophageal reflux disease without esophagitis: Secondary | ICD-10-CM | POA: Insufficient documentation

## 2022-08-05 DIAGNOSIS — D128 Benign neoplasm of rectum: Secondary | ICD-10-CM | POA: Insufficient documentation

## 2022-08-05 DIAGNOSIS — Z681 Body mass index (BMI) 19 or less, adult: Secondary | ICD-10-CM | POA: Insufficient documentation

## 2022-08-05 DIAGNOSIS — K5730 Diverticulosis of large intestine without perforation or abscess without bleeding: Secondary | ICD-10-CM

## 2022-08-05 DIAGNOSIS — Z87891 Personal history of nicotine dependence: Secondary | ICD-10-CM | POA: Insufficient documentation

## 2022-08-05 DIAGNOSIS — D120 Benign neoplasm of cecum: Secondary | ICD-10-CM | POA: Insufficient documentation

## 2022-08-05 HISTORY — PX: COLONOSCOPY WITH PROPOFOL: SHX5780

## 2022-08-05 HISTORY — PX: POLYPECTOMY: SHX5525

## 2022-08-05 HISTORY — PX: BIOPSY: SHX5522

## 2022-08-05 HISTORY — PX: ESOPHAGOGASTRODUODENOSCOPY (EGD) WITH PROPOFOL: SHX5813

## 2022-08-05 SURGERY — COLONOSCOPY WITH PROPOFOL
Anesthesia: General

## 2022-08-05 MED ORDER — LIDOCAINE HCL 1 % IJ SOLN
INTRAMUSCULAR | Status: DC | PRN
  Administered 2022-08-05: 50 mg via INTRADERMAL

## 2022-08-05 MED ORDER — LACTATED RINGERS IV SOLN: INTRAVENOUS | Status: DC | PRN

## 2022-08-05 MED ORDER — PROPOFOL 500 MG/50ML IV EMUL
INTRAVENOUS | Status: DC | PRN
  Administered 2022-08-05: 75 ug/kg/min via INTRAVENOUS

## 2022-08-05 MED ORDER — PROPOFOL 10 MG/ML IV BOLUS
INTRAVENOUS | Status: DC | PRN
  Administered 2022-08-05: 30 mg via INTRAVENOUS
  Administered 2022-08-05 (×2): 20 mg via INTRAVENOUS
  Administered 2022-08-05: 10 mg via INTRAVENOUS
  Administered 2022-08-05: 100 mg via INTRAVENOUS

## 2022-08-05 NOTE — Transfer of Care (Signed)
Immediate Anesthesia Transfer of Care Note  Patient: Jason Spence  Procedure(s) Performed: COLONOSCOPY WITH PROPOFOL ESOPHAGOGASTRODUODENOSCOPY (EGD) WITH PROPOFOL BIOPSY POLYPECTOMY  Patient Location: Endoscopy Unit  Anesthesia Type:General  Level of Consciousness: awake  Airway & Oxygen Therapy: Patient Spontanous Breathing  Post-op Assessment: Report given to RN  Post vital signs: Reviewed  Last Vitals:  Vitals Value Taken Time  BP 108/56 08/05/22 0825  Temp 36.6 C 08/05/22 0825  Pulse 55 08/05/22 0825  Resp 15 08/05/22 0825  SpO2 100 % 08/05/22 0825    Last Pain:  Vitals:   08/05/22 0825  TempSrc: Oral  PainSc: 0-No pain      Patients Stated Pain Goal: 6 (19/47/12 5271)  Complications: No notable events documented.

## 2022-08-05 NOTE — Op Note (Signed)
[facility] Patient Name: Jason Spence Procedure Date: 08/05/2022 7:13 AM MRN: [id number] Date of Birth: Aug 21, 1941 Attending MD: Maylon Peppers ,  CSN: [id number] Age: 81 Admit Type: Outpatient Procedure:                Upper GI endoscopy Indications:              Iron deficiency anemia Providers:                Maylon Peppers, Janeece Riggers, RN, Everardo Pacific Referring MD:              Medicines:                Monitored Anesthesia Care Complications:            No immediate complications. Estimated Blood Loss:     Estimated blood loss: none. Procedure:                Pre-Anesthesia Assessment:                           - Prior to the procedure, a History and Physical                            was performed, and patient medications, allergies                            and sensitivities were reviewed. The patient's                            tolerance of previous anesthesia was reviewed.                           - The risks and benefits of the procedure and the                            sedation options and risks were discussed with the                            patient. All questions were answered and informed                            consent was obtained.                           - ASA Grade Assessment: III - A patient with severe                            systemic disease.                           After obtaining informed consent, the endoscope was                            passed under direct vision. Throughout the                            procedure, the patient's blood pressure,  pulse, and                            oxygen saturations were monitored continuously. The                            [id number]-H190 ([id number]) scope was introduced through the                            mouth, and advanced to the second part of duodenum.                            The upper GI endoscopy was accomplished without                            difficulty. The patient  tolerated the procedure                            well. Scope In: 7:40:08 AM Scope Out: 7:46:34 AM Total Procedure Duration: 0 hours 6 minutes 26 seconds  Findings:      A 1 cm sliding hiatal hernia was found.      The gastroesophageal flap valve was visualized endoscopically and       classified as Hill Grade II (fold present, opens with respiration).      The stomach was normal.      The examined duodenum was normal. This was biopsied with a cold forceps       for histology. Impression:               - 1 cm sliding hiatal hernia.                           - Gastroesophageal flap valve classified as Hill                            Grade II (fold present, opens with respiration).                           - Normal stomach.                           - Normal examined duodenum. Biopsied. Moderate Sedation:      Per Anesthesia Care Recommendation:           - Discharge patient to home (ambulatory).                           - Resume previous diet.                           - Await pathology results. Procedure Code(s):        --- Professional ---                           [phone], Esophagogastroduodenoscopy, flexible,                            transoral;  with biopsy, single or multiple Diagnosis Code(s):        --- Professional ---                           K44.9, Diaphragmatic hernia without obstruction or                            gangrene                           D50.9, Iron deficiency anemia, unspecified CPT copyright 2019 American Medical [employer]. All rights reserved. The codes documented in this report are preliminary and upon coder review may  be revised to meet current compliance requirements. Maylon Peppers, MD Maylon Peppers,  08/05/2022 7:51:35 AM This report has been signed electronically. Number of Addenda: 0

## 2022-08-05 NOTE — Op Note (Signed)
[facility] Patient Name: Jason Spence Procedure Date: 08/05/2022 7:12 AM MRN: [id number] Date of Birth: 1941-06-12 Attending MD: Maylon Peppers ,  CSN: [id number] Age: 81 Admit Type: Outpatient Procedure:                Colonoscopy Indications:              Iron deficiency anemia Providers:                Maylon Peppers, Janeece Riggers, RN, Everardo Pacific Referring MD:              Medicines:                Monitored Anesthesia Care Complications:            No immediate complications. Estimated Blood Loss:     Estimated blood loss: none. Procedure:                Pre-Anesthesia Assessment:                           - Prior to the procedure, a History and Physical                            was performed, and patient medications, allergies                            and sensitivities were reviewed. The patient's                            tolerance of previous anesthesia was reviewed.                           - The risks and benefits of the procedure and the                            sedation options and risks were discussed with the                            patient. All questions were answered and informed                            consent was obtained.                           - ASA Grade Assessment: II - A patient with mild                            systemic disease.                           After obtaining informed consent, the colonoscope                            was passed under direct vision. Throughout the                            procedure, the patient's blood pressure, pulse, and  oxygen saturations were monitored continuously. The                            [id number]-HQ190L ([id number]) scope was introduced through                            the anus and advanced to the the terminal ileum.                            The colonoscopy was performed without difficulty.                            The patient tolerated the procedure  well. The                            quality of the bowel preparation was good. Scope In: 7:53:47 AM Scope Out: 8:18:23 AM Scope Withdrawal Time: 0 hours 17 minutes 46 seconds  Total Procedure Duration: 0 hours 24 minutes 36 seconds  Findings:      The perianal and digital rectal examinations were normal.      The terminal ileum appeared normal.      Two sessile polyps were found in the transverse colon and cecum. The       polyps were 3 to 4 mm in size. These polyps were removed with a cold       snare. Resection and retrieval were complete.      Two sessile polyps were found in the rectum. The polyps were 3 to 4 mm       in size. These polyps were removed with a cold snare. Resection and       retrieval were complete.      Multiple small and large-mouthed diverticula were found in the sigmoid       colon, descending colon and transverse colon.      Non-bleeding internal hemorrhoids were found during retroflexion. The       hemorrhoids were small. Impression:               - The examined portion of the ileum was normal.                           - Two 3 to 4 mm polyps in the transverse colon and                            in the cecum, removed with a cold snare. Resected                            and retrieved.                           - Two 3 to 4 mm polyps in the rectum, removed with                            a cold snare. Resected and retrieved.                           -  Diverticulosis in the sigmoid colon, in the                            descending colon and in the transverse colon.                           - Non-bleeding internal hemorrhoids. Moderate Sedation:      Per Anesthesia Care Recommendation:           - Discharge patient to home (ambulatory).                           - Resume previous diet.                           - Await pathology results.                           - Repeat colonoscopy is not recommended due to                            current age (16  years or older) for screening                            purposes.                           - Proceed with capsule endoscopy Procedure Code(s):        --- Professional ---                           [phone], Colonoscopy, flexible; with removal of                            tumor(s), polyp(s), or other lesion(s) by snare                            technique Diagnosis Code(s):        --- Professional ---                           K63.5, Polyp of colon                           K62.1, Rectal polyp                           K64.8, Other hemorrhoids                           D50.9, Iron deficiency anemia, unspecified                           K57.30, Diverticulosis of large intestine without                            perforation or abscess without bleeding CPT copyright 2019 American Medical Association. All rights reserved. The codes documented  in this report are preliminary and upon coder review may  be revised to meet current compliance requirements. Maylon Peppers, MD Maylon Peppers,  08/05/2022 8:28:17 AM This report has been signed electronically. Number of Addenda: 0

## 2022-08-05 NOTE — Interval H&P Note (Signed)
History and Physical Interval Note:  08/05/2022 7:34 AM  Jason Spence  has presented today for surgery, with the diagnosis of Iron Defiency Anemia.  The various methods of treatment have been discussed with the patient and family. After consideration of risks, benefits and other options for treatment, the patient has consented to  Procedure(s) with comments: COLONOSCOPY WITH PROPOFOL (N/A) - 1030 ASA 3 ESOPHAGOGASTRODUODENOSCOPY (EGD) WITH PROPOFOL (N/A) as a surgical intervention.  The patient's history has been reviewed, patient examined, no change in status, stable for surgery.  I have reviewed the patient's chart and labs.  Questions were answered to the patient's satisfaction.     Maylon Peppers Mayorga

## 2022-08-05 NOTE — Discharge Instructions (Addendum)
You are being discharged to home.  Resume your previous diet.  We are waiting for your pathology results.  Your physician has indicated that a repeat colonoscopy is not recommended due to your current age (43 years or older) for screening purposes.  Proceed with capsule endoscopy

## 2022-08-05 NOTE — Telephone Encounter (Signed)
[facility] Capsule Study has been scheduled on Tuesday 08/24/22 and Jason Spence is aware

## 2022-08-05 NOTE — Anesthesia Preprocedure Evaluation (Signed)
Anesthesia Evaluation  Patient identified by MRN, date of birth, ID band Patient awake    Reviewed: Allergy & Precautions, H&P , NPO status , Patient's Chart, lab work & pertinent test results, reviewed documented beta blocker date and time   Airway Mallampati: II  TM Distance: >3 FB Neck ROM: full    Dental no notable dental hx.    Pulmonary neg pulmonary ROS, former smoker,    Pulmonary exam normal breath sounds clear to auscultation       Cardiovascular Exercise Tolerance: Good hypertension, negative cardio ROS   Rhythm:regular Rate:Normal     Neuro/Psych negative neurological ROS  negative psych ROS   GI/Hepatic Neg liver ROS, GERD  Medicated,  Endo/Other  negative endocrine ROS  Renal/GU negative Renal ROS  negative genitourinary   Musculoskeletal   Abdominal   Peds  Hematology  (+) Blood dyscrasia, anemia ,   Anesthesia Other Findings   Reproductive/Obstetrics negative OB ROS                             Anesthesia Physical Anesthesia Plan  ASA: 3  Anesthesia Plan: General   Post-op Pain Management:    Induction:   PONV Risk Score and Plan: Propofol infusion  Airway Management Planned:   Additional Equipment:   Intra-op Plan:   Post-operative Plan:   Informed Consent: I have reviewed the patients History and Physical, chart, labs and discussed the procedure including the risks, benefits and alternatives for the proposed anesthesia with the patient or authorized representative who has indicated his/her understanding and acceptance.     Dental Advisory Given  Plan Discussed with: CRNA  Anesthesia Plan Comments:         Anesthesia Quick Evaluation

## 2022-08-05 NOTE — Telephone Encounter (Signed)
-----   Message from Harvel Quale, MD sent at 08/05/2022  8:57 AM EDT ----- Tomasita Morrow,   Can you please schedule a capsule endoscopy? Dx: iron def anemia  Thanks,  Maylon Peppers, MD [facility]

## 2022-08-05 NOTE — Telephone Encounter (Signed)
Thanks

## 2022-08-05 NOTE — Anesthesia Postprocedure Evaluation (Signed)
Anesthesia Post Note  Patient: Jason Spence  Procedure(s) Performed: COLONOSCOPY WITH PROPOFOL ESOPHAGOGASTRODUODENOSCOPY (EGD) WITH PROPOFOL BIOPSY POLYPECTOMY  Patient location during evaluation: Short Stay Anesthesia Type: General Level of consciousness: awake and alert Pain management: pain level controlled Vital Signs Assessment: post-procedure vital signs reviewed and stable Respiratory status: spontaneous breathing Cardiovascular status: blood pressure returned to baseline and stable Postop Assessment: no apparent nausea or vomiting Anesthetic complications: no   No notable events documented.   Last Vitals:  Vitals:   08/05/22 0657 08/05/22 0825  BP: (!) 186/74 (!) 108/56  Pulse: 66 (!) 55  Resp: 16 15  Temp: 36.6 C 36.6 C  SpO2: 100% 100%    Last Pain:  Vitals:   08/05/22 0825  TempSrc: Oral  PainSc: 0-No pain                 Kemontae Dunklee

## 2022-08-06 LAB — SURGICAL PATHOLOGY

## 2022-08-10 ENCOUNTER — Encounter (HOSPITAL_COMMUNITY): Payer: Self-pay | Admitting: Gastroenterology

## 2022-08-24 ENCOUNTER — Ambulatory Visit (HOSPITAL_COMMUNITY)
Admission: RE | Admit: 2022-08-24 | Discharge: 2022-08-24 | Disposition: A | Discharge status: Home / Self Care | Payer: Medicare HMO | Source: Ambulatory Visit | Attending: Gastroenterology | Admitting: Gastroenterology

## 2022-08-24 ENCOUNTER — Ambulatory Visit (INDEPENDENT_AMBULATORY_CARE_PROVIDER_SITE_OTHER): Payer: Medicare HMO

## 2022-08-24 ENCOUNTER — Encounter (HOSPITAL_COMMUNITY)
Admission: RE | Disposition: A | Discharge status: Home / Self Care | Payer: Self-pay | Source: Ambulatory Visit | Attending: Gastroenterology

## 2022-08-24 VITALS — Wt 123.0 lb

## 2022-08-24 DIAGNOSIS — Z0000 Encounter for general adult medical examination without abnormal findings: Secondary | ICD-10-CM | POA: Diagnosis not present

## 2022-08-24 DIAGNOSIS — Z9889 Other specified postprocedural states: Secondary | ICD-10-CM

## 2022-08-24 DIAGNOSIS — D500 Iron deficiency anemia secondary to blood loss (chronic): Secondary | ICD-10-CM | POA: Diagnosis not present

## 2022-08-24 HISTORY — PX: GIVENS CAPSULE STUDY: SHX5432

## 2022-08-24 SURGERY — IMAGING PROCEDURE, GI TRACT, INTRALUMINAL, VIA CAPSULE

## 2022-08-24 NOTE — Progress Notes (Signed)
Virtual Visit via Telephone Note  I connected with  Jason Spence on 08/24/22 at  3:30 PM EST by telephone and verified that I am speaking with the correct person using two identifiers.  Location: Patient: HOME Provider: [facility] Persons participating in the virtual visit: patient/Nurse Health Advisor   I discussed the limitations, risks, security and privacy concerns of performing an evaluation and management service by telephone and the availability of in person appointments. The patient expressed understanding and agreed to proceed.  Interactive audio and video telecommunications were attempted between this nurse and patient, however failed, due to patient having technical difficulties OR patient did not have access to video capability.  We continued and completed visit with audio only.  Some vital signs may be absent or patient reported.   Jason David, LPN  Subjective:   Jason Spence is a 81 y.o. male who presents for Medicare Annual/Subsequent preventive examination.  Review of Systems     Cardiac Risk Factors include: advanced age (>66mn, >>26women);hypertension;male gender     Objective:    There were no vitals filed for this visit. There is no height or weight on file to calculate BMI.     08/24/2022    3:32 PM 08/05/2022    6:58 AM 09/19/2021   12:00 PM 08/18/2021    3:18 PM 02/27/2018   11:45 AM 03/22/2015   11:23 AM 01/05/2013   12:11 PM  Advanced Directives  Does Patient Have a Medical Advance Directive? No No No Yes Yes No Patient does not have advance directive;Patient would not like information  Type of [place]    Copy of HB[place]in Chart?    No - copy requested No - copy requested    Would patient like information on creating a medical advance directive? No - Patient declined No - Patient declined No - Patient declined        Current  Medications (verified) Outpatient Encounter Medications as of 08/24/2022  Medication Sig   albuterol (VENTOLIN HFA) 108 (90 Base) MCG/ACT inhaler Inhale 2 puffs into the lungs every 4 (four) hours as needed for wheezing.   Ascorbic Acid (VITAMIN C) 1000 MG tablet Take 1,000 mg by mouth daily.   aspirin 81 MG tablet Take 1 tablet (81 mg total) by mouth daily.   budesonide-formoterol (SYMBICORT) 160-4.5 MCG/ACT inhaler Inhale 2 puffs into the lungs 2 (two) times daily. Rinse mouth with water after each use (Patient taking differently: Inhale 2 puffs into the lungs 2 (two) times daily as needed (shortness of breath). Rinse mouth with water after each use)   cetirizine (ZYRTEC ALLERGY) 10 MG tablet Take 1 tablet (10 mg total) by mouth daily.   Cholecalciferol (VITAMIN D3 PO) Take 1 tablet by mouth daily.   fish oil-omega-3 fatty acids 1000 MG capsule Take 1 g by mouth daily.    fluticasone (FLONASE) 50 MCG/ACT nasal spray Place 1 spray into both nostrils 2 (two) times daily.   ketoconazole (NIZORAL) 2 % cream APPLY CREAM TOPICALLY TO AFFECTED AREA TWICE DAILY AS NEEDED (Patient taking differently: Apply 1 Application topically daily as needed for irritation.)   meclizine (ANTIVERT) 25 MG tablet Take 1 tablet (25 mg total) by mouth 3 (three) times daily as needed for dizziness.   Multiple Vitamins-Minerals (PRESERVISION AREDS 2 PO) Take 1 capsule by mouth 2 (two) times daily.   pantoprazole (PROTONIX) 40 MG tablet  TAKE 1 TABLET BY MOUTH ONCE DAILY. TAKE 30 MINUTES BEFORE BREAKFAST   pravastatin (PRAVACHOL) 40 MG tablet Take 1 tablet by mouth once daily   ramipril (ALTACE) 10 MG capsule Take 1 capsule (10 mg total) by mouth daily.   triamcinolone cream (KENALOG) 0.1 % Apply 1 Application topically daily. (Patient taking differently: Apply 1 Application topically daily as needed (rash).)   No facility-administered encounter medications on file as of 08/24/2022.    Allergies (verified) Patient has no  known allergies.   History: Past Medical History:  Diagnosis Date   Anal stricture 10/18/2006   Arthritis    Hypercholesteremia    Hypertension    Myocardial infarct [facility])    Past Surgical History:  Procedure Laterality Date   BALLOON DILATION N/A 01/05/2013   Procedure: BALLOON DILATION;  Surgeon: Rogene Houston, MD;  [facility];  Service: Endoscopy;  Laterality: N/A;   BIOPSY  02/27/2018   Procedure: BIOPSY;  Surgeon: Rogene Houston, MD;  [facility];  Service: Endoscopy;;  esophagus   BIOPSY  08/05/2022   Procedure: BIOPSY;  Surgeon: Harvel Quale, MD;  [facility];  Service: Gastroenterology;;   COLONOSCOPY WITH ESOPHAGOGASTRODUODENOSCOPY (EGD) N/A 01/05/2013   Procedure: COLONOSCOPY WITH ESOPHAGOGASTRODUODENOSCOPY (EGD);  Surgeon: Rogene Houston, MD;  [facility];  Service: Endoscopy;  Laterality: N/A;  100   COLONOSCOPY WITH PROPOFOL N/A 08/05/2022   Procedure: COLONOSCOPY WITH PROPOFOL;  Surgeon: Harvel Quale, MD;  [facility];  Service: Gastroenterology;  Laterality: N/A;  1030 ASA 3   CORONARY ARTERY BYPASS GRAFT     ESOPHAGEAL DILATION N/A 02/27/2018   Procedure: ESOPHAGEAL DILATION;  Surgeon: Rogene Houston, MD;  [facility];  Service: Endoscopy;  Laterality: N/A;   ESOPHAGOGASTRODUODENOSCOPY N/A 02/27/2018   Procedure: ESOPHAGOGASTRODUODENOSCOPY (EGD);  Surgeon: Rogene Houston, MD;  [facility];  Service: Endoscopy;  Laterality: N/A;  12:25   ESOPHAGOGASTRODUODENOSCOPY (EGD) WITH PROPOFOL N/A 08/05/2022   Procedure: ESOPHAGOGASTRODUODENOSCOPY (EGD) WITH PROPOFOL;  Surgeon: Harvel Quale, MD;  [facility];  Service: Gastroenterology;  Laterality: N/A;   HERNIA REPAIR     POLYPECTOMY  08/05/2022   Procedure: POLYPECTOMY;  Surgeon: Harvel Quale, MD;  [facility];  Service: Gastroenterology;;   Family History   Problem Relation Age of Onset   Hypertension Sister    Social History   Socioeconomic History   Marital status: Widowed    Spouse name: Not on file   Number of children: Not on file   Years of education: Not on file   Highest education level: Not on file  Occupational History   Not on file  Tobacco Use   Smoking status: Former    Packs/day: 0.50    Types: Cigarettes    Quit date: 08/04/1995    Years since quitting: 27.0   Smokeless tobacco: Never  Vaping Use   Vaping Use: Never used  Substance and Sexual Activity   Alcohol use: Yes    Comment: rare   Drug use: Never   Sexual activity: Yes    Birth control/protection: None  Other Topics Concern   Not on file  Social History Narrative   ** Merged History Encounter **       Social Determinants of Health   Financial Resource Strain: Low Risk  (08/24/2022)   Overall Financial Resource Strain (CARDIA)    Difficulty of Paying Living Expenses: Not hard at  all  Food Insecurity: No Food Insecurity (08/24/2022)   Hunger Vital Sign    Worried About Running Out of Food in the Last Year: Never true    Ran Out of Food in the Last Year: Never true  Transportation Needs: No Transportation Needs (08/24/2022)   PRAPARE - Hydrologist (Medical): No    Lack of Transportation (Non-Medical): No  Physical Activity: Insufficiently Active (08/24/2022)   Exercise Vital Sign    Days of Exercise per Week: 5 days    Minutes of Exercise per Session: 20 min  Stress: No Stress Concern Present (08/24/2022)   [place]    Feeling of Stress : Not at all  Social Connections: Moderately Isolated (08/24/2022)   Social Connection and Isolation Panel [NHANES]    Frequency of Communication with Friends and Family: More than three times a week    Frequency of Social Gatherings with Friends and Family: Twice a week    Attends Religious Services: More than 4 times  per year    Active Member of [employer] or Organizations: No    Attends Archivist Meetings: Never    Marital Status: Widowed    Tobacco Counseling Counseling given: Not Answered   Clinical Intake:  Pre-visit preparation completed: Yes  Pain : No/denies pain     Nutritional Risks: None Diabetes: No  How often do you need to have someone help you when you read instructions, pamphlets, or other written materials from your doctor or pharmacy?: 1 - Never  Diabetic?no  Interpreter Needed?: No  Information entered by :: Kirke Shaggy, LPN   Activities of Daily Living    08/24/2022    3:34 PM  In your present state of health, do you have any difficulty performing the following activities:  Hearing? 1  Vision? 0  Difficulty concentrating or making decisions? 0  Walking or climbing stairs? 0  Dressing or bathing? 0  Doing errands, shopping? 1  Preparing Food and eating ? N  Using the Toilet? N  In the past six months, have you accidently leaked urine? N  Do you have problems with loss of bowel control? N  Managing your Medications? Y  Managing your Finances? Y  Housekeeping or managing your Housekeeping? Y    Patient Care Team: Kathyrn Drown, MD as PCP - General (Family Medicine)  Indicate any recent Medical Services you may have received from other than [facility] providers in the past year (date may be approximate).     Assessment:   This is a routine wellness examination for [facility].  Hearing/Vision screen Hearing Screening - Comments:: No aids Vision Screening - Comments:: Wears glasses- Dr.Gruen  Dietary issues and exercise activities discussed: Current Exercise Habits: Home exercise routine, Type of exercise: walking, Time (Minutes): 20, Frequency (Times/Week): 5, Weekly Exercise (Minutes/Week): 100, Intensity: Mild   Goals Addressed             This Visit's Progress    DIET - EAT MORE FRUITS AND VEGETABLES        Depression Screen     08/24/2022    3:30 PM 05/03/2022    3:22 PM 01/26/2022    1:15 PM 08/18/2021    3:15 PM 08/17/2021   11:55 AM 06/08/2021    2:42 PM 02/04/2021   10:46 AM  PHQ 2/9 Scores  PHQ - 2 Score 0 0 0 0 0 0 0  PHQ- 9 Score 0  Fall Risk    08/24/2022    3:33 PM 05/03/2022    3:22 PM 01/26/2022    1:15 PM 08/18/2021    3:18 PM 08/17/2021   11:55 AM  Fall Risk   Falls in the past year? 1 0 0 0 0  Number falls in past yr: 0 0 0 0 0  Injury with Fall? 0 0 0 0 0  Risk for fall due to : History of fall(s) No Fall Risks No Fall Risks Impaired vision;Impaired mobility No Fall Risks  Follow up Falls prevention discussed;Falls evaluation completed Falls evaluation completed Falls evaluation completed Falls prevention discussed Falls evaluation completed    FALL RISK PREVENTION PERTAINING TO THE HOME:  Any stairs in or around the home? Yes  If so, are there any without handrails? No  Home free of loose throw rugs in walkways, pet beds, electrical cords, etc? Yes  Adequate lighting in your home to reduce risk of falls? Yes   ASSISTIVE DEVICES UTILIZED TO PREVENT FALLS:  Life alert? No  Use of a cane, Proto or w/c? No  Grab bars in the bathroom? No  Shower chair or bench in shower? Yes  Elevated toilet seat or a handicapped toilet? No   Cognitive Function:        08/24/2022    3:35 PM  6CIT Screen  What Year? 0 points  What month? 0 points  What time? 0 points  Count back from 20 0 points  Months in reverse 0 points  Repeat phrase 2 points  Total Score 2 points    Immunizations Immunization History  Administered Date(s) Administered   Fluad Quad(high Dose 65+) 08/08/2020   Influenza,inj,Quad PF,6+ Mos 07/12/2013, 07/15/2014, 07/01/2015, 08/03/2017, 06/20/2018, 08/08/2019   Influenza-Unspecified 07/18/2012, 08/19/2016   Moderna Sars-Covid-2 Vaccination 10/25/2019, 11/29/2019   PNEUMOCOCCAL CONJUGATE-20 06/15/2022   Pneumococcal Conjugate-13 07/15/2014   Pneumococcal  Polysaccharide-23 07/18/2005   Td 10/19/2011   Zoster, Live 10/18/2005    TDAP status: Due, Education has been provided regarding the importance of this vaccine. Advised may receive this vaccine at local pharmacy or Health Dept. Aware to provide a copy of the vaccination record if obtained from local pharmacy or Health Dept. Verbalized acceptance and understanding.  Flu Vaccine status: Due, Education has been provided regarding the importance of this vaccine. Advised may receive this vaccine at local pharmacy or Health Dept. Aware to provide a copy of the vaccination record if obtained from local pharmacy or Health Dept. Verbalized acceptance and understanding.  Pneumococcal vaccine status: Up to date  Covid-19 vaccine status: Completed vaccines  Qualifies for Shingles Vaccine? Yes   Zostavax completed Yes   Shingrix Completed?: No.    Education has been provided regarding the importance of this vaccine. Patient has been advised to call insurance company to determine out of pocket expense if they have not yet received this vaccine. Advised may also receive vaccine at local pharmacy or Health Dept. Verbalized acceptance and understanding.  Screening Tests Health Maintenance  Topic Date Due   Zoster Vaccines- Shingrix (1 of 2) Never done   COVID-19 Vaccine (3 - Moderna risk series) 12/27/2019   TETANUS/TDAP  10/18/2021   INFLUENZA VACCINE  05/18/2022   Medicare Annual Wellness (AWV)  08/25/2023   Pneumonia Vaccine 51+ Years old  Completed   HPV VACCINES  Aged Out    Health Maintenance  Health Maintenance Due  Topic Date Due   Zoster Vaccines- Shingrix (1 of 2) Never done   COVID-19 Vaccine (3 -  Moderna risk series) 12/27/2019   TETANUS/TDAP  10/18/2021   INFLUENZA VACCINE  05/18/2022    Colorectal cancer screening: No longer required.   Lung Cancer Screening: (Low Dose CT Chest recommended if Age 60-80 years, 30 pack-year currently smoking OR have quit w/in 15years.) does not  qualify.     Additional Screening:  Hepatitis C Screening: does not qualify; Completed no  Vision Screening: Recommended annual ophthalmology exams for early detection of glaucoma and other disorders of the eye. Is the patient up to date with their annual eye exam?  Yes  Who is the provider or what is the name of the office in which the patient attends annual eye exams? Dr.Gruen If pt is not established with a provider, would they like to be referred to a provider to establish care? No .   Dental Screening: Recommended annual dental exams for proper oral hygiene  Community Resource Referral / Chronic Care Management: CRR required this visit?  No   CCM required this visit?  No      Plan:     I have personally reviewed and noted the following in the patient's chart:   Medical and social history Use of alcohol, tobacco or illicit drugs  Current medications and supplements including opioid prescriptions. Patient is not currently taking opioid prescriptions. Functional ability and status Nutritional status Physical activity Advanced directives List of other physicians Hospitalizations, surgeries, and ER visits in previous 12 months Vitals Screenings to include cognitive, depression, and falls Referrals and appointments  In addition, I have reviewed and discussed with patient certain preventive protocols, quality metrics, and best practice recommendations. A written personalized care plan for preventive services as well as general preventive health recommendations were provided to patient.     Jason David, LPN   [id number]   Nurse Notes: none

## 2022-08-24 NOTE — Patient Instructions (Signed)
Jason Spence , Thank you for taking time to come for your Medicare Wellness Visit. I appreciate your ongoing commitment to your health goals. Please review the following plan we discussed and let me know if I can assist you in the future.   Screening recommendations/referrals: Colonoscopy: aged out Recommended yearly ophthalmology/optometry visit for glaucoma screening and checkup Recommended yearly dental visit for hygiene and checkup  Vaccinations: Influenza vaccine: n/d Pneumococcal vaccine: 07/15/14 Tdap vaccine: 10/19/11, due if have injury Shingles vaccine: n/d   Covid-19: 10/25/19, 11/29/19  Advanced directives: no  Conditions/risks identified: none  Next appointment: Follow up in one year for your annual wellness visit.   Preventive Care 32 Years and Older, Male Preventive care refers to lifestyle choices and visits with your health care provider that can promote health and wellness. What does preventive care include? A yearly physical exam. This is also called an annual well check. Dental exams once or twice a year. Routine eye exams. Ask your health care provider how often you should have your eyes checked. Personal lifestyle choices, including: Daily care of your teeth and gums. Regular physical activity. Eating a healthy diet. Avoiding tobacco and drug use. Limiting alcohol use. Practicing safe sex. Taking low doses of aspirin every day. Taking vitamin and mineral supplements as recommended by your health care provider. What happens during an annual well check? The services and screenings done by your health care provider during your annual well check will depend on your age, overall health, lifestyle risk factors, and family history of disease. Counseling  Your health care provider may ask you questions about your: Alcohol use. Tobacco use. Drug use. Emotional well-being. Home and relationship well-being. Sexual activity. Eating habits. History of falls. Memory  and ability to understand (cognition). Work and work Statistician. Screening  You may have the following tests or measurements: Height, weight, and BMI. Blood pressure. Lipid and cholesterol levels. These may be checked every 5 years, or more frequently if you are over 56 years old. Skin check. Lung cancer screening. You may have this screening every year starting at age 27 if you have a 30-pack-year history of smoking and currently smoke or have quit within the past 15 years. Fecal occult blood test (FOBT) of the stool. You may have this test every year starting at age 33. Flexible sigmoidoscopy or colonoscopy. You may have a sigmoidoscopy every 5 years or a colonoscopy every 10 years starting at age 33. Prostate cancer screening. Recommendations will vary depending on your family history and other risks. Hepatitis C blood test. Hepatitis B blood test. Sexually transmitted disease (STD) testing. Diabetes screening. This is done by checking your blood sugar (glucose) after you have not eaten for a while (fasting). You may have this done every 1-3 years. Abdominal aortic aneurysm (AAA) screening. You may need this if you are a current or former smoker. Osteoporosis. You may be screened starting at age 73 if you are at high risk. Talk with your health care provider about your test results, treatment options, and if necessary, the need for more tests. Vaccines  Your health care provider may recommend certain vaccines, such as: Influenza vaccine. This is recommended every year. Tetanus, diphtheria, and acellular pertussis (Tdap, Td) vaccine. You may need a Td booster every 10 years. Zoster vaccine. You may need this after age 83. Pneumococcal 13-valent conjugate (PCV13) vaccine. One dose is recommended after age 1. Pneumococcal polysaccharide (PPSV23) vaccine. One dose is recommended after age 6. Talk to your health care provider  about which screenings and vaccines you need and how often you  need them. This information is not intended to replace advice given to you by your health care provider. Make sure you discuss any questions you have with your health care provider. Document Released: 10/31/2015 Document Revised: 06/23/2016 Document Reviewed: 08/05/2015 Elsevier Interactive Patient Education  2017 [place] Prevention in the Home Falls can cause injuries. They can happen to people of all ages. There are many things you can do to make your home safe and to help prevent falls. What can I do on the outside of my home? Regularly fix the edges of walkways and driveways and fix any cracks. Remove anything that might make you trip as you walk through a door, such as a raised step or threshold. Trim any bushes or trees on the path to your home. Use bright outdoor lighting. Clear any walking paths of anything that might make someone trip, such as rocks or tools. Regularly check to see if handrails are loose or broken. Make sure that both sides of any steps have handrails. Any raised decks and porches should have guardrails on the edges. Have any leaves, snow, or ice cleared regularly. Use sand or salt on walking paths during winter. Clean up any spills in your garage right away. This includes oil or grease spills. What can I do in the bathroom? Use night lights. Install grab bars by the toilet and in the tub and shower. Do not use towel bars as grab bars. Use non-skid mats or decals in the tub or shower. If you need to sit down in the shower, use a plastic, non-slip stool. Keep the floor dry. Clean up any water that spills on the floor as soon as it happens. Remove soap buildup in the tub or shower regularly. Attach bath mats securely with double-sided non-slip rug tape. Do not have throw rugs and other things on the floor that can make you trip. What can I do in the bedroom? Use night lights. Make sure that you have a light by your bed that is easy to reach. Do not  use any sheets or blankets that are too big for your bed. They should not hang down onto the floor. Have a firm chair that has side arms. You can use this for support while you get dressed. Do not have throw rugs and other things on the floor that can make you trip. What can I do in the kitchen? Clean up any spills right away. Avoid walking on wet floors. Keep items that you use a lot in easy-to-reach places. If you need to reach something above you, use a strong step stool that has a grab bar. Keep electrical cords out of the way. Do not use floor polish or wax that makes floors slippery. If you must use wax, use non-skid floor wax. Do not have throw rugs and other things on the floor that can make you trip. What can I do with my stairs? Do not leave any items on the stairs. Make sure that there are handrails on both sides of the stairs and use them. Fix handrails that are broken or loose. Make sure that handrails are as long as the stairways. Check any carpeting to make sure that it is firmly attached to the stairs. Fix any carpet that is loose or worn. Avoid having throw rugs at the top or bottom of the stairs. If you do have throw rugs, attach them to the floor  with carpet tape. Make sure that you have a light switch at the top of the stairs and the bottom of the stairs. If you do not have them, ask someone to add them for you. What else can I do to help prevent falls? Wear shoes that: Do not have high heels. Have rubber bottoms. Are comfortable and fit you well. Are closed at the toe. Do not wear sandals. If you use a stepladder: Make sure that it is fully opened. Do not climb a closed stepladder. Make sure that both sides of the stepladder are locked into place. Ask someone to hold it for you, if possible. Clearly mark and make sure that you can see: Any grab bars or handrails. First and last steps. Where the edge of each step is. Use tools that help you move around (mobility  aids) if they are needed. These include: Canes. Walkers. Scooters. Crutches. Turn on the lights when you go into a dark area. Replace any light bulbs as soon as they burn out. Set up your furniture so you have a clear path. Avoid moving your furniture around. If any of your floors are uneven, fix them. If there are any pets around you, be aware of where they are. Review your medicines with your doctor. Some medicines can make you feel dizzy. This can increase your chance of falling. Ask your doctor what other things that you can do to help prevent falls. This information is not intended to replace advice given to you by your health care provider. Make sure you discuss any questions you have with your health care provider. Document Released: 07/31/2009 Document Revised: 03/11/2016 Document Reviewed: 11/08/2014 Elsevier Interactive Patient Education  2017 [employer].

## 2022-08-25 ENCOUNTER — Encounter (HOSPITAL_COMMUNITY): Payer: Self-pay | Admitting: Gastroenterology

## 2022-08-25 ENCOUNTER — Other Ambulatory Visit (INDEPENDENT_AMBULATORY_CARE_PROVIDER_SITE_OTHER): Payer: Self-pay | Admitting: Gastroenterology

## 2022-08-25 DIAGNOSIS — D500 Iron deficiency anemia secondary to blood loss (chronic): Secondary | ICD-10-CM

## 2022-08-25 MED ORDER — FERROUS SULFATE 325 (65 FE) MG PO TABS: 325.0000 mg | ORAL_TABLET | Freq: Every day | ORAL | 3 refills | Status: DC

## 2022-08-25 NOTE — Procedures (Signed)
Small Bowel Givens Capsule Study Procedure date: 08/24/2022  Referring Provider:  PCP PCP:  Dr. Kathyrn Drown, MD  Indication for procedure:  Iron deficiency anemia  Findings:  Study was adequate as capsule reached the cecum and the bowel preparation was adequate in the small bowel. Slight congestion inf the proximal small bowel without bleeding. No other abnormalities were found in the gastrointestinal lining.  First Gastric image:  00:02:12 First Duodenal image: 00:13:18 First Cecal image: 04:29:45 Gastric Passage time: 0h 41mSmall Bowel Passage time:  4h 161m  Summary & Recommendations: Slight congestion inf the proximal small bowel without bleeding. No other abnormalities.  - Start ferrous sulfate 325 mg qday. - Stop using high dose aspirin including Goody/BC powders, NSAIDs such as Aleve, ibuprofen, naproxen, Motrin, Voltaren or Advil (even the topical ones)  I personally communicated these recommendations to the patient  DaMaylon PeppersMD [facility]

## 2022-09-01 ENCOUNTER — Telehealth: Payer: Self-pay | Admitting: Family Medicine

## 2022-09-01 NOTE — Telephone Encounter (Signed)
Called the rx in to the [facility] spoke with Jimmey Ralph gave verbal and patient aware.

## 2022-09-01 NOTE — Telephone Encounter (Signed)
Called in on patient behalf in regard to iron supplement.  Patient states that pharm has not received med.  Med order was placed on 11/8   Patient  wants a call back in regard.

## 2022-09-07 ENCOUNTER — Other Ambulatory Visit: Payer: Self-pay

## 2022-09-07 MED ORDER — KETOCONAZOLE 2 % EX CREA: 1.0000 | TOPICAL_CREAM | Freq: Every day | CUTANEOUS | 1 refills | Status: DC | PRN

## 2022-09-07 MED ORDER — PRAVASTATIN SODIUM 40 MG PO TABS: 40.0000 mg | ORAL_TABLET | Freq: Every day | ORAL | 0 refills | Status: DC

## 2022-09-22 ENCOUNTER — Ambulatory Visit (INDEPENDENT_AMBULATORY_CARE_PROVIDER_SITE_OTHER): Payer: Medicare HMO | Admitting: Family Medicine

## 2022-09-22 ENCOUNTER — Other Ambulatory Visit: Payer: Self-pay

## 2022-09-22 VITALS — BP 130/82 | HR 76 | Temp 97.9°F | Ht 67.0 in | Wt 124.2 lb

## 2022-09-22 DIAGNOSIS — E7849 Other hyperlipidemia: Secondary | ICD-10-CM

## 2022-09-22 DIAGNOSIS — R051 Acute cough: Secondary | ICD-10-CM

## 2022-09-22 DIAGNOSIS — R6889 Other general symptoms and signs: Secondary | ICD-10-CM

## 2022-09-22 DIAGNOSIS — J019 Acute sinusitis, unspecified: Secondary | ICD-10-CM

## 2022-09-22 DIAGNOSIS — J8410 Pulmonary fibrosis, unspecified: Secondary | ICD-10-CM

## 2022-09-22 DIAGNOSIS — I10 Essential (primary) hypertension: Secondary | ICD-10-CM

## 2022-09-22 MED ORDER — AMOXICILLIN-POT CLAVULANATE 875-125 MG PO TABS: 1.0000 | ORAL_TABLET | Freq: Two times a day (BID) | ORAL | 0 refills | Status: DC

## 2022-09-22 NOTE — Progress Notes (Signed)
   Subjective:    Patient ID: COLEY KULIKOWSKI, male    DOB: 20-Dec-1940, 81 y.o.   MRN: [id number]  Cough This is a new problem. The current episode started in the past 7 days. Associated symptoms include headaches and nasal congestion. Associated symptoms comments: Sinus pain.  Started 4 days ago  Some runny nose little bit of headaches not feeling good clear runny nose cough has history of interstitial lung disease  Review of Systems  Respiratory:  Positive for cough.   Neurological:  Positive for headaches.       Objective:   Physical Exam General-in no acute distress Eyes-no discharge Lungs-respiratory rate normal, CTA CV-no murmurs,RRR Extremities skin warm dry no edema Neuro grossly normal Behavior normal, alert        Assessment & Plan:   Viral syndrome Secondary rhinosinusitis possible Doubt pneumonia With his underlying history of interstitial lung disease he is at risk of developing into pneumonia Prescription antibiotic given if he gets worse over the next few days start the antibiotic notify us  Triple swab taken await the results  Follow-up in approximately 3 weeks to do lab work before that visit

## 2022-09-24 LAB — COVID-19, FLU A+B AND RSV
Influenza A, NAA: NOT DETECTED
Influenza B, NAA: NOT DETECTED
RSV, NAA: NOT DETECTED
SARS-CoV-2, NAA: NOT DETECTED

## 2022-09-29 ENCOUNTER — Telehealth: Payer: Self-pay | Admitting: Family Medicine

## 2022-09-29 NOTE — Telephone Encounter (Signed)
FYI

## 2022-09-29 NOTE — Telephone Encounter (Signed)
FYI- patient wanted you to know he filled antibiotic last week.

## 2022-10-05 DIAGNOSIS — E7849 Other hyperlipidemia: Secondary | ICD-10-CM | POA: Diagnosis not present

## 2022-10-05 DIAGNOSIS — I10 Essential (primary) hypertension: Secondary | ICD-10-CM | POA: Diagnosis not present

## 2022-10-06 LAB — CBC WITH DIFFERENTIAL/PLATELET
Basophils Absolute: 0.1 10*3/uL (ref 0.0–0.2)
Basos: 1 %
EOS (ABSOLUTE): 0.7 10*3/uL — ABNORMAL HIGH (ref 0.0–0.4)
Eos: 8 %
Hematocrit: 35.8 % — ABNORMAL LOW (ref 37.5–51.0)
Hemoglobin: 11.5 g/dL — ABNORMAL LOW (ref 13.0–17.7)
Immature Grans (Abs): 0 10*3/uL (ref 0.0–0.1)
Immature Granulocytes: 0 %
Lymphocytes Absolute: 1.7 10*3/uL (ref 0.7–3.1)
Lymphs: 19 %
MCH: 26.4 pg — ABNORMAL LOW (ref 26.6–33.0)
MCHC: 32.1 g/dL (ref 31.5–35.7)
MCV: 82 fL (ref 79–97)
Monocytes Absolute: 0.7 10*3/uL (ref 0.1–0.9)
Monocytes: 8 %
Neutrophils Absolute: 5.6 10*3/uL (ref 1.4–7.0)
Neutrophils: 64 %
Platelets: 334 10*3/uL (ref 150–450)
RBC: 4.35 x10E6/uL (ref 4.14–5.80)
RDW: 14.5 % (ref 11.6–15.4)
WBC: 8.9 10*3/uL (ref 3.4–10.8)

## 2022-10-06 LAB — LIPID PANEL
Chol/HDL Ratio: 3.6 ratio (ref 0.0–5.0)
Cholesterol, Total: 94 mg/dL — ABNORMAL LOW (ref 100–199)
HDL: 26 mg/dL — ABNORMAL LOW (ref >39)
LDL Chol Calc (NIH): 49 mg/dL (ref 0–99)
Triglycerides: 95 mg/dL (ref 0–149)
VLDL Cholesterol Cal: 19 mg/dL (ref 5–40)

## 2022-10-06 LAB — BASIC METABOLIC PANEL (7)
BUN/Creatinine Ratio: 19 (ref 10–24)
BUN: 15 mg/dL (ref 8–27)
CO2: 25 mmol/L (ref 20–29)
Chloride: 98 mmol/L (ref 96–106)
Creatinine, Ser: 0.79 mg/dL (ref 0.76–1.27)
Glucose: 90 mg/dL (ref 70–99)
Potassium: 4.5 mmol/L (ref 3.5–5.2)
Sodium: 135 mmol/L (ref 134–144)
eGFR: 89 mL/min/{1.73_m2} (ref >59)

## 2022-10-06 LAB — HEPATIC FUNCTION PANEL
ALT: 14 IU/L (ref 0–44)
AST: 17 IU/L (ref 0–40)
Albumin: 3.8 g/dL (ref 3.7–4.7)
Alkaline Phosphatase: 89 IU/L (ref 44–121)
Bilirubin Total: 0.3 mg/dL (ref 0.0–1.2)
Bilirubin, Direct: 0.11 mg/dL (ref 0.00–0.40)
Total Protein: 7.2 g/dL (ref 6.0–8.5)

## 2022-10-09 ENCOUNTER — Other Ambulatory Visit: Payer: Self-pay | Admitting: Family Medicine

## 2022-10-12 ENCOUNTER — Ambulatory Visit (INDEPENDENT_AMBULATORY_CARE_PROVIDER_SITE_OTHER): Payer: Medicare HMO | Admitting: Family Medicine

## 2022-10-12 VITALS — BP 122/60 | HR 65 | Temp 98.6°F | Wt 121.2 lb

## 2022-10-12 DIAGNOSIS — I10 Essential (primary) hypertension: Secondary | ICD-10-CM

## 2022-10-12 DIAGNOSIS — H548 Legal blindness, as defined in USA: Secondary | ICD-10-CM

## 2022-10-12 DIAGNOSIS — J8410 Pulmonary fibrosis, unspecified: Secondary | ICD-10-CM | POA: Diagnosis not present

## 2022-10-12 DIAGNOSIS — E7849 Other hyperlipidemia: Secondary | ICD-10-CM

## 2022-10-12 DIAGNOSIS — S300XXA Contusion of lower back and pelvis, initial encounter: Secondary | ICD-10-CM | POA: Diagnosis not present

## 2022-10-12 DIAGNOSIS — D509 Iron deficiency anemia, unspecified: Secondary | ICD-10-CM | POA: Diagnosis not present

## 2022-10-12 MED ORDER — PANTOPRAZOLE SODIUM 40 MG PO TBEC: DELAYED_RELEASE_TABLET | ORAL | 1 refills | Status: DC

## 2022-10-12 MED ORDER — PRAVASTATIN SODIUM 40 MG PO TABS: 40.0000 mg | ORAL_TABLET | Freq: Every day | ORAL | 1 refills | Status: DC

## 2022-10-12 MED ORDER — RAMIPRIL 10 MG PO CAPS: 10.0000 mg | ORAL_CAPSULE | Freq: Every day | ORAL | 1 refills | Status: DC

## 2022-10-12 NOTE — Progress Notes (Signed)
   Subjective:    Patient ID: Jason Spence, male    DOB: 07-26-1941, 81 y.o.   MRN: [id number]  HPI Patient is here to follow up for check up and lab work.122/6 Essential hypertension, benign  Pulmonary fibrosis (HCC)  Other hyperlipidemia  Legally blind  Iron deficiency anemia, unspecified iron deficiency anemia type  Contusion of sacrum, initial encounter  Patient does have pulmonary fibrosis it appears to be a slow progression has follow-up with [facility] in March Occasionally gets a little winded when he pushes himself too fast Patient for blood pressure check up.  The patient does have hypertension.   Patient relates dietary measures try to minimize salt The importance of healthy diet and activity were discussed Patient relates compliance  Recently had a fall because he tripped landed on his bottom and has pain on the sacrum able to walk around just hurts when he sits  Patient also has degenerative macular which is causing significant visual problems he is legally blind Fall contusion  Resp illness    Review of Systems     Objective:   Physical Exam  General-in no acute distress Eyes-no discharge Lungs-respiratory rate normal, CTA CV-no murmurs,RRR Extremities skin warm dry no edema Neuro grossly normal Behavior normal, alert       Assessment & Plan:  1. Essential hypertension, benign HTN- patient seen for follow-up regarding HTN.   Diet, medication compliance, appropriate labs and refills were completed.   Importance of keeping blood pressure under good control to lessen the risk of complications discussed Regular follow-up visits discussed   2. Pulmonary fibrosis ([place]) Sees specialist at [facility] in a few months is hanging in there the best he can no significant breathing issues today getting over recent respiratory illness no sign of pneumonia on exam  3. Other hyperlipidemia Hyperlipidemia-importance of diet, weight control, activity,  compliance with medications discussed.   Recent labs reviewed.   Any additional labs or refills ordered.   Importance of keeping under good control discussed. Regular follow-up visits discussed   4. Legally blind Counseling with patient regarding avoiding falls  5. Iron deficiency anemia, unspecified iron deficiency anemia type Follow through gastroenterology in January as planned  6. Contusion of sacrum, initial encounter No x-ray indicated recommend  3 should gradually get better over time  Follow-up office visit April  Patient dealing with the uncertainty of his health issues causing some depression symptoms does not want medication currently

## 2022-10-15 ENCOUNTER — Ambulatory Visit: Payer: Medicare HMO | Admitting: Family Medicine

## 2022-10-28 ENCOUNTER — Ambulatory Visit (INDEPENDENT_AMBULATORY_CARE_PROVIDER_SITE_OTHER): Payer: Medicare HMO | Admitting: Gastroenterology

## 2022-10-28 ENCOUNTER — Encounter (INDEPENDENT_AMBULATORY_CARE_PROVIDER_SITE_OTHER): Payer: Self-pay | Admitting: Gastroenterology

## 2022-10-28 VITALS — BP 153/70 | HR 65 | Temp 97.4°F | Ht 67.0 in | Wt 119.5 lb

## 2022-10-28 DIAGNOSIS — D509 Iron deficiency anemia, unspecified: Secondary | ICD-10-CM

## 2022-10-28 NOTE — Patient Instructions (Signed)
We will check iron studies today to see how your levels look Please continue your ferrous sulfate once a day for now Make sure diet is high in iron rich foods You can add 1-2 protein shakes per day between meals or at bedtime to help with added nutrients and maintaining your weight   Follow up 3 months

## 2022-10-28 NOTE — Progress Notes (Addendum)
Referring Provider: Kathyrn Drown, MD Primary Care Physician:  Kathyrn Drown, MD Primary GI Physician: Jenetta Downer   Chief Complaint  Patient presents with   Iron def anemia    Patient arrives with daughter in law [place]. Follow up on IDA. Reports doing well and no concerns.    HPI:   Jason Spence is a 82 y.o. male with past medical history of GERD, IDA.   Patient presenting today for follow up of IDA.  Last seen October 2023, at that time, presenting for new onset IDA with hgb 12, TIBC 378, Iron 22, Iron saturation 6, ferritin 29. Denied any symptoms of IDA, no rectal bleeding or melena. Had some weight loss after his wife past but felt that appetite was good.  Recommended celiac panel, EGD/TCS, recheck h&h, iron studies, avoid NSAIDs.  EGD/colonoscopy as below, givens capsule study thereafter, as outlined below, started on ferrous sulfate '325mg'$ , daily advised to avoid NSAIDs.   Present: Hgb 10/05/22 11.5, last iron studies in October with iron sat 11, ferritin 23, TIBC 385, iron 44  Patient states that he is feeling well. Taking iron pill once daily. No rectal bleeding or melena. He states that he had some previous SOB from a cold he had due to pulmonary fibrosis but no ongoing SOB. No excessive fatigue or dizziness. Appetite is good and daughter in law accompanying him states he eats well. Does feel he Cannot seem to gain any weight despite eating well. His daughter helps with his meals and he also gets meals on wheels. He is taking miralax PRN due to the iron pills constipating him, this works well for him. He has no abdominal pain, nausea, vomiting, diarrhea.   Givens capsule study: 08/24/22: Slight congestion in the proximal small bowel without bleeding. No other abnormalities.  Last Colonoscopy:07/2022 - The examined portion of the ileum was normal. - Two 3 to 4 mm polyps in the transverse colon and in the cecum - Two 3 to 4 mm polyps in the rectum, removed with a cold  snare. (3 TAs, 1 HP polyp)  - Diverticulosis in the sigmoid colon, in the descending colon and in the transverse colon. - Non-bleeding internal hemorrhoids. Last Endoscopy: 07/2022 - 1 cm sliding hiatal hernia. - Gastroesophageal flap valve classified as Hill   Grade II (fold present, opens with respiration). - Normal stomach.  - Normal examined duodenum. Biopsied-normal   Recommendations:    Past Medical History:  Diagnosis Date   Anal stricture 10/18/2006   Arthritis    Hypercholesteremia    Hypertension    Myocardial infarct [facility])     Past Surgical History:  Procedure Laterality Date   BALLOON DILATION N/A 01/05/2013   Procedure: BALLOON DILATION;  Surgeon: Rogene Houston, MD;  [facility];  Service: Endoscopy;  Laterality: N/A;   BIOPSY  02/27/2018   Procedure: BIOPSY;  Surgeon: Rogene Houston, MD;  [facility];  Service: Endoscopy;;  esophagus   BIOPSY  08/05/2022   Procedure: BIOPSY;  Surgeon: Harvel Quale, MD;  [facility];  Service: Gastroenterology;;   COLONOSCOPY WITH ESOPHAGOGASTRODUODENOSCOPY (EGD) N/A 01/05/2013   Procedure: COLONOSCOPY WITH ESOPHAGOGASTRODUODENOSCOPY (EGD);  Surgeon: Rogene Houston, MD;  [facility];  Service: Endoscopy;  Laterality: N/A;  100   COLONOSCOPY WITH PROPOFOL N/A 08/05/2022   Procedure: COLONOSCOPY WITH PROPOFOL;  Surgeon: Harvel Quale, MD;  [facility];  Service: Gastroenterology;  Laterality: N/A;  1030 ASA  3   CORONARY ARTERY BYPASS GRAFT     ESOPHAGEAL DILATION N/A 02/27/2018   Procedure: ESOPHAGEAL DILATION;  Surgeon: Rogene Houston, MD;  [facility];  Service: Endoscopy;  Laterality: N/A;   ESOPHAGOGASTRODUODENOSCOPY N/A 02/27/2018   Procedure: ESOPHAGOGASTRODUODENOSCOPY (EGD);  Surgeon: Rogene Houston, MD;  [facility];  Service: Endoscopy;  Laterality: N/A;  12:25   ESOPHAGOGASTRODUODENOSCOPY (EGD) WITH PROPOFOL N/A  08/05/2022   Procedure: ESOPHAGOGASTRODUODENOSCOPY (EGD) WITH PROPOFOL;  Surgeon: Harvel Quale, MD;  [facility];  Service: Gastroenterology;  Laterality: N/A;   GIVENS CAPSULE STUDY N/A 08/24/2022   Procedure: GIVENS CAPSULE STUDY;  Surgeon: Harvel Quale, MD;  [facility];  Service: Gastroenterology;  Laterality: N/A;  730 am   HERNIA REPAIR     POLYPECTOMY  08/05/2022   Procedure: POLYPECTOMY;  Surgeon: Montez Morita, Quillian Quince, MD;  [facility];  Service: Gastroenterology;;    Current Outpatient Medications  Medication Sig Dispense Refill   albuterol (VENTOLIN HFA) 108 (90 Base) MCG/ACT inhaler Inhale 2 puffs into the lungs every 4 (four) hours as needed for wheezing. 1 each 2   Ascorbic Acid (VITAMIN C) 1000 MG tablet Take 1,000 mg by mouth daily.     aspirin 81 MG tablet Take 1 tablet (81 mg total) by mouth daily. 30 tablet    budesonide-formoterol (SYMBICORT) 160-4.5 MCG/ACT inhaler Inhale 2 puffs into the lungs 2 (two) times daily. Rinse mouth with water after each use (Patient taking differently: Inhale 2 puffs into the lungs 2 (two) times daily as needed (shortness of breath). Rinse mouth with water after each use) 1 each 5   cetirizine (ZYRTEC ALLERGY) 10 MG tablet Take 1 tablet (10 mg total) by mouth daily. 30 tablet 2   Cholecalciferol (VITAMIN D3 PO) Take 1 tablet by mouth daily.     ferrous sulfate 325 (65 FE) MG tablet Take 1 tablet (325 mg total) by mouth daily with breakfast. 90 tablet 3   fish oil-omega-3 fatty acids 1000 MG capsule Take 1 g by mouth daily.      fluticasone (FLONASE) 50 MCG/ACT nasal spray Place 1 spray into both nostrils 2 (two) times daily. 16 g 2   ketoconazole (NIZORAL) 2 % cream Apply 1 Application topically daily as needed for irritation. 15 g 1   meclizine (ANTIVERT) 25 MG tablet Take 1 tablet (25 mg total) by mouth 3 (three) times daily as needed for dizziness. 21 tablet 0   Multiple  Vitamins-Minerals (PRESERVISION AREDS 2 PO) Take 1 capsule by mouth 2 (two) times daily.     pantoprazole (PROTONIX) 40 MG tablet TAKE 1 TABLET BY MOUTH ONCE DAILY. TAKE 30 MINUTES BEFORE BREAKFAST 90 tablet 1   pravastatin (PRAVACHOL) 40 MG tablet Take 1 tablet (40 mg total) by mouth daily. 90 tablet 1   ramipril (ALTACE) 10 MG capsule Take 1 capsule (10 mg total) by mouth daily. 90 capsule 1   triamcinolone cream (KENALOG) 0.1 % Apply 1 Application topically daily. (Patient taking differently: Apply 1 Application topically daily as needed (rash).) 80 g 3   No current facility-administered medications for this visit.    Allergies as of 10/28/2022   (No Known Allergies)    Family History  Problem Relation Age of Onset   Hypertension Sister     Social History   Socioeconomic History   Marital status: Widowed    Spouse name: Not on file   Number of children: Not on file  Years of education: Not on file   Highest education level: Not on file  Occupational History   Not on file  Tobacco Use   Smoking status: Former    Packs/day: 0.50    Types: Cigarettes    Quit date: 08/04/1995    Years since quitting: 27.2    Passive exposure: Past   Smokeless tobacco: Never  Vaping Use   Vaping Use: Never used  Substance and Sexual Activity   Alcohol use: Not Currently    Comment: rare   Drug use: Never   Sexual activity: Yes    Birth control/protection: None  Other Topics Concern   Not on file  Social History Narrative   ** Merged History Encounter **       Social Determinants of Health   Financial Resource Strain: Low Risk  (08/24/2022)   Overall Financial Resource Strain (CARDIA)    Difficulty of Paying Living Expenses: Not hard at all  Food Insecurity: No Food Insecurity (08/24/2022)   Hunger Vital Sign    Worried About Running Out of Food in the Last Year: Never true    Ran Out of Food in the Last Year: Never true  Transportation Needs: No Transportation Needs (08/24/2022)    PRAPARE - Hydrologist (Medical): No    Lack of Transportation (Non-Medical): No  Physical Activity: Insufficiently Active (08/24/2022)   Exercise Vital Sign    Days of Exercise per Week: 5 days    Minutes of Exercise per Session: 20 min  Stress: No Stress Concern Present (08/24/2022)   [place]    Feeling of Stress : Not at all  Social Connections: Moderately Isolated (08/24/2022)   Social Connection and Isolation Panel [NHANES]    Frequency of Communication with Friends and Family: More than three times a week    Frequency of Social Gatherings with Friends and Family: Twice a week    Attends Religious Services: More than 4 times per year    Active Member of [employer] or Organizations: No    Attends Archivist Meetings: Never    Marital Status: Widowed   Review of systems General: negative for malaise, night sweats, fever, chills, weight loss Neck: Negative for lumps, goiter, pain and significant neck swelling Resp: Negative for cough, wheezing, dyspnea at rest CV: Negative for chest pain, leg swelling, palpitations, orthopnea GI: denies melena, hematochezia, nausea, vomiting, diarrhea, constipation, dysphagia, odyonophagia, early satiety or unintentional weight loss.  MSK: Negative for joint pain or swelling, back pain, and muscle pain. Derm: Negative for itching or rash Psych: Denies depression, anxiety, memory loss, confusion. No homicidal or suicidal ideation.  Heme: Negative for prolonged bleeding, bruising easily, and swollen nodes. Endocrine: Negative for cold or heat intolerance, polyuria, polydipsia and goiter. Neuro: negative for tremor, gait imbalance, syncope and seizures. The remainder of the review of systems is noncontributory.  Physical Exam: There were no vitals taken for this visit. General:   Alert and oriented. No distress noted. Pleasant and cooperative.   Head:  Normocephalic and atraumatic. Eyes:  Conjuctiva clear without scleral icterus. Mouth:  Oral mucosa pink and moist. Good dentition. No lesions. Heart: Normal rate and rhythm, s1 and s2 heart sounds present.  Lungs: Clear lung sounds in all lobes. Respirations equal and unlabored. Abdomen:  +BS, soft, non-tender and non-distended. No rebound or guarding. No HSM or masses noted. Derm: No palmar erythema or jaundice Msk:  Symmetrical without gross deformities.  Normal posture. Extremities:  Without edema. Neurologic:  Alert and  oriented x4 Psych:  Alert and cooperative. Normal mood and affect.  Invalid input(s): "6 MONTHS"   ASSESSMENT: Jason Spence is a 82 y.o. male presenting today for follow up of IDA.  Work up for [place] with EGD/Colonoscopy and givens capsule study without definitive etiology determined. Celiac testing negative. Possibly some aspect of iron malabsorption. He is maintained on ferrous sulfate '325mg'$  daily, last hgb in December was 11.5, he has ben in the 11-12 range for the past year. No rectal bleeding or melena. Denies any symptoms of anemia. Will recheck iron studies today. Continue daily iron for now and iron rich diet. Avoid NSAIDs  weight 119 lbs today, 123 lbs in October, as he feels he cannot gain any weight despite eating well, encouraged to liberalize diet, add 1-2 protein shakes per day. Notably TSH in June 2023 was normal, CT chest in April 2023 without concerning lesions.    PLAN:   Iron studies 2. Continue to liberalize diet  3. Protein shakes 1-2x/day  4. Continue iron pill once daily/iron rich diet   All questions were answered, patient verbalized understanding and is in agreement with plan as outlined above.    Follow Up: 3 months  Chukwuemeka Artola L. Alver Sorrow, MSN, APRN, AGNP-C Adult-Gerontology Nurse Practitioner [facility]  I have reviewed the note and agree with the APP's assessment as described in this progress  note  Maylon Peppers, MD Gastroenterology and Hepatology [facility]

## 2022-10-29 LAB — IRON,TIBC AND FERRITIN PANEL
%SAT: 46 % (calc) (ref 20–48)
Ferritin: 31 ng/mL (ref 24–380)
Iron: 148 ug/dL (ref 50–180)
TIBC: 322 mcg/dL (calc) (ref 250–425)

## 2022-12-21 DIAGNOSIS — H353231 Exudative age-related macular degeneration, bilateral, with active choroidal neovascularization: Secondary | ICD-10-CM | POA: Diagnosis not present

## 2022-12-21 DIAGNOSIS — H35033 Hypertensive retinopathy, bilateral: Secondary | ICD-10-CM | POA: Diagnosis not present

## 2022-12-21 DIAGNOSIS — H43812 Vitreous degeneration, left eye: Secondary | ICD-10-CM | POA: Diagnosis not present

## 2022-12-28 DIAGNOSIS — L821 Other seborrheic keratosis: Secondary | ICD-10-CM | POA: Diagnosis not present

## 2022-12-28 DIAGNOSIS — L568 Other specified acute skin changes due to ultraviolet radiation: Secondary | ICD-10-CM | POA: Diagnosis not present

## 2022-12-28 DIAGNOSIS — D1801 Hemangioma of skin and subcutaneous tissue: Secondary | ICD-10-CM | POA: Diagnosis not present

## 2022-12-28 DIAGNOSIS — Z1283 Encounter for screening for malignant neoplasm of skin: Secondary | ICD-10-CM | POA: Diagnosis not present

## 2022-12-30 ENCOUNTER — Other Ambulatory Visit: Payer: Self-pay | Admitting: Family Medicine

## 2023-01-12 DIAGNOSIS — I2510 Atherosclerotic heart disease of native coronary artery without angina pectoris: Secondary | ICD-10-CM | POA: Diagnosis not present

## 2023-01-12 DIAGNOSIS — H3530 Unspecified macular degeneration: Secondary | ICD-10-CM | POA: Diagnosis not present

## 2023-01-12 DIAGNOSIS — K219 Gastro-esophageal reflux disease without esophagitis: Secondary | ICD-10-CM | POA: Diagnosis not present

## 2023-01-12 DIAGNOSIS — Z7982 Long term (current) use of aspirin: Secondary | ICD-10-CM | POA: Diagnosis not present

## 2023-01-12 DIAGNOSIS — K222 Esophageal obstruction: Secondary | ICD-10-CM | POA: Diagnosis not present

## 2023-01-12 DIAGNOSIS — J84112 Idiopathic pulmonary fibrosis: Secondary | ICD-10-CM | POA: Diagnosis not present

## 2023-01-12 DIAGNOSIS — I252 Old myocardial infarction: Secondary | ICD-10-CM | POA: Diagnosis not present

## 2023-01-12 DIAGNOSIS — Z87891 Personal history of nicotine dependence: Secondary | ICD-10-CM | POA: Diagnosis not present

## 2023-01-12 DIAGNOSIS — Z951 Presence of aortocoronary bypass graft: Secondary | ICD-10-CM | POA: Diagnosis not present

## 2023-01-12 DIAGNOSIS — Z79899 Other long term (current) drug therapy: Secondary | ICD-10-CM | POA: Diagnosis not present

## 2023-01-17 DIAGNOSIS — I252 Old myocardial infarction: Secondary | ICD-10-CM | POA: Diagnosis not present

## 2023-01-17 DIAGNOSIS — J301 Allergic rhinitis due to pollen: Secondary | ICD-10-CM | POA: Diagnosis not present

## 2023-01-17 DIAGNOSIS — K219 Gastro-esophageal reflux disease without esophagitis: Secondary | ICD-10-CM | POA: Diagnosis not present

## 2023-01-17 DIAGNOSIS — Z87891 Personal history of nicotine dependence: Secondary | ICD-10-CM | POA: Diagnosis not present

## 2023-01-17 DIAGNOSIS — E785 Hyperlipidemia, unspecified: Secondary | ICD-10-CM | POA: Diagnosis not present

## 2023-01-17 DIAGNOSIS — I129 Hypertensive chronic kidney disease with stage 1 through stage 4 chronic kidney disease, or unspecified chronic kidney disease: Secondary | ICD-10-CM | POA: Diagnosis not present

## 2023-01-17 DIAGNOSIS — N182 Chronic kidney disease, stage 2 (mild): Secondary | ICD-10-CM | POA: Diagnosis not present

## 2023-01-17 DIAGNOSIS — I2510 Atherosclerotic heart disease of native coronary artery without angina pectoris: Secondary | ICD-10-CM | POA: Diagnosis not present

## 2023-01-17 DIAGNOSIS — M1990 Unspecified osteoarthritis, unspecified site: Secondary | ICD-10-CM | POA: Diagnosis not present

## 2023-01-17 DIAGNOSIS — Z8249 Family history of ischemic heart disease and other diseases of the circulatory system: Secondary | ICD-10-CM | POA: Diagnosis not present

## 2023-01-17 DIAGNOSIS — Z008 Encounter for other general examination: Secondary | ICD-10-CM | POA: Diagnosis not present

## 2023-01-17 DIAGNOSIS — N529 Male erectile dysfunction, unspecified: Secondary | ICD-10-CM | POA: Diagnosis not present

## 2023-01-17 DIAGNOSIS — Z809 Family history of malignant neoplasm, unspecified: Secondary | ICD-10-CM | POA: Diagnosis not present

## 2023-02-07 ENCOUNTER — Ambulatory Visit (INDEPENDENT_AMBULATORY_CARE_PROVIDER_SITE_OTHER): Payer: Medicare HMO | Admitting: Gastroenterology

## 2023-02-07 ENCOUNTER — Encounter (INDEPENDENT_AMBULATORY_CARE_PROVIDER_SITE_OTHER): Payer: Self-pay | Admitting: Gastroenterology

## 2023-02-07 VITALS — BP 152/68 | HR 56 | Temp 97.4°F | Ht 67.0 in | Wt 118.9 lb

## 2023-02-07 DIAGNOSIS — R634 Abnormal weight loss: Secondary | ICD-10-CM

## 2023-02-07 DIAGNOSIS — D509 Iron deficiency anemia, unspecified: Secondary | ICD-10-CM | POA: Diagnosis not present

## 2023-02-07 NOTE — Patient Instructions (Signed)
Lets take the iron pills every other day You can Start taking Miralax for constipation if this is still occurring with the iron, recommend starting with 1 capful every day for one week. If bowel movements do not improve, increase to 1 capful every 12 hours. If after two weeks there is no improvement, increase to 1 capful every 8 hours Increase water intake, aim for atleast 64 oz per day Increase fruits, veggies and whole grains, kiwi and prunes are especially good for constipation  Please discuss follow up Chest CT for pulmonary nodule with your PCP Continue to liberalize your diet and do protein shakes, 1-2 every day We will continue to keep a close eye on your weight as it still appears to be trending down  Follow up 3 months  It was a pleasure to see you today. I want to create trusting relationships with patients and provide genuine, compassionate, and quality care. I truly value your feedback! please be on the lookout for a survey regarding your visit with me today. I appreciate your input about our visit and your time in completing this!    Niyonna Betsill L. Jeanmarie Hubert, MSN, APRN, AGNP-C Adult-Gerontology Nurse Practitioner [facility] at [facility]

## 2023-02-07 NOTE — Progress Notes (Signed)
Referring Provider: Babs Sciara, MD Primary Care Physician:  Babs Sciara, MD Primary GI Physician: Levon Hedger   Chief Complaint  Patient presents with   Anemia    Follow up on anemia. Reports he decreased iron tablet from daily to about every 2 -3 days due to constipation. Not seeing any dark stools, no dizziness, no sob.    Weight Loss    Follow up on Weight today 118.9. reports appetite is good. Drinks a meal replacement shake about every day.    HPI:   Jason Spence is a 82 y.o. male with past medical history of GERD, IDA  Patient presenting today for follow up of IDA, weight loss  Last seen January 2024, at that time most recent Hgb 10/05/22 11.5, last iron studies in October with iron sat 11, ferritin 23, TIBC 385, iron 44. Patient feeling well. Taking iron pill once daily. No rectal bleeding or melena. Appetite is good and daughter in law accompanying him states he eats well. Does feel he Cannot seem to gain any weight despite eating well. His daughter helps with his meals and he also gets meals on wheels. He is taking miralax PRN due to the iron pills constipating him, this works well for him. He has no abdominal pain, nausea, vomiting, diarrhea.   Recommended repeat Iron studies, liberalize diet, protein shakes 1-2x/day, continue iron pill daily.  Iron studies on 1/11 with iron 148, TIBC 322, sat 46%, ferritin 31, advised to continue daily iron pill  Present:  Patient states he is doing well. He started taking iron pills every 2-3 days as he was feeling more constipated, changed this about 1 month ago. He was using milk of magnesia PRN when he was taking iron PO daily. He notes that he was going every few days at that time, now going once daily. He notes that he has some fatigue at baseline but does not feel it is worse than usually. No dizziness or SOB. Denies rectal bleeding or melena. No abdominal pain.   Appetite is good. He does protein shake maybe once per day  but forgets these sometimes. He is down to 118 lbs   TSH June 2023 was WNL at 0.690  Notably, Pt had CT of the chest in 2023 with pulmonary fibrosis, benign scarring, nodule of left lower lobe (suspected benign), recommended repeat imaging in 1 year for surveillance which he has not had, they are seeing PCP again in the next few weeks.  Givens capsule study: 08/24/22: Slight congestion in the proximal small bowel without bleeding. No other abnormalities.  Last Colonoscopy:07/2022 - The examined portion of the ileum was normal. - Two 3 to 4 mm polyps in the transverse colon and in the cecum - Two 3 to 4 mm polyps in the rectum, removed with a cold snare. (3 TAs, 1 HP polyp)  - Diverticulosis in the sigmoid colon, in the descending colon and in the transverse colon. - Non-bleeding internal hemorrhoids. Last Endoscopy: 07/2022 - 1 cm sliding hiatal hernia. - Gastroesophageal flap valve classified as Hill   Grade II (fold present, opens with respiration). - Normal stomach.  - Normal examined duodenum. Biopsied-normal    Past Medical History:  Diagnosis Date   Anal stricture 10/18/2006   Arthritis    Hypercholesteremia    Hypertension    Myocardial infarct [facility])     Past Surgical History:  Procedure Laterality Date   BALLOON DILATION N/A 01/05/2013   Procedure: BALLOON DILATION;  Surgeon: Ok Anis  Cline Crock, MD;  [facility];  Service: Endoscopy;  Laterality: N/A;   BIOPSY  02/27/2018   Procedure: BIOPSY;  Surgeon: Malissa Hippo, MD;  [facility];  Service: Endoscopy;;  esophagus   BIOPSY  08/05/2022   Procedure: BIOPSY;  Surgeon: Dolores Frame, MD;  [facility];  Service: Gastroenterology;;   COLONOSCOPY WITH ESOPHAGOGASTRODUODENOSCOPY (EGD) N/A 01/05/2013   Procedure: COLONOSCOPY WITH ESOPHAGOGASTRODUODENOSCOPY (EGD);  Surgeon: Malissa Hippo, MD;  [facility];  Service: Endoscopy;  Laterality: N/A;  100   COLONOSCOPY WITH  PROPOFOL N/A 08/05/2022   Procedure: COLONOSCOPY WITH PROPOFOL;  Surgeon: Dolores Frame, MD;  [facility];  Service: Gastroenterology;  Laterality: N/A;  1030 ASA 3   CORONARY ARTERY BYPASS GRAFT     ESOPHAGEAL DILATION N/A 02/27/2018   Procedure: ESOPHAGEAL DILATION;  Surgeon: Malissa Hippo, MD;  [facility];  Service: Endoscopy;  Laterality: N/A;   ESOPHAGOGASTRODUODENOSCOPY N/A 02/27/2018   Procedure: ESOPHAGOGASTRODUODENOSCOPY (EGD);  Surgeon: Malissa Hippo, MD;  [facility];  Service: Endoscopy;  Laterality: N/A;  12:25   ESOPHAGOGASTRODUODENOSCOPY (EGD) WITH PROPOFOL N/A 08/05/2022   Procedure: ESOPHAGOGASTRODUODENOSCOPY (EGD) WITH PROPOFOL;  Surgeon: Dolores Frame, MD;  [facility];  Service: Gastroenterology;  Laterality: N/A;   GIVENS CAPSULE STUDY N/A 08/24/2022   Procedure: GIVENS CAPSULE STUDY;  Surgeon: Dolores Frame, MD;  [facility];  Service: Gastroenterology;  Laterality: N/A;  730 am   HERNIA REPAIR     POLYPECTOMY  08/05/2022   Procedure: POLYPECTOMY;  Surgeon: Marguerita Merles, Reuel Boom, MD;  [facility];  Service: Gastroenterology;;    Current Outpatient Medications  Medication Sig Dispense Refill   albuterol (VENTOLIN HFA) 108 (90 Base) MCG/ACT inhaler Inhale 2 puffs into the lungs every 4 (four) hours as needed for wheezing. 1 each 2   Ascorbic Acid (VITAMIN C) 1000 MG tablet Take 1,000 mg by mouth daily.     aspirin 81 MG tablet Take 1 tablet (81 mg total) by mouth daily. 30 tablet    budesonide-formoterol (BREYNA) 160-4.5 MCG/ACT inhaler INHALE 2 PUFFS BY MOUTH TWICE DAILY -  RINSE  MOUTH  WITH  WATER  AFTER  EACH  USE 11 g 5   cetirizine (ZYRTEC ALLERGY) 10 MG tablet Take 1 tablet (10 mg total) by mouth daily. 30 tablet 2   Cholecalciferol (VITAMIN D3 PO) Take 1 tablet by mouth daily.     ferrous sulfate 325 (65 FE) MG tablet Take 1 tablet (325 mg total) by  mouth daily with breakfast. 90 tablet 3   fish oil-omega-3 fatty acids 1000 MG capsule Take 1 g by mouth daily.      ketoconazole (NIZORAL) 2 % cream Apply 1 Application topically daily as needed for irritation. 15 g 1   meclizine (ANTIVERT) 25 MG tablet Take 1 tablet (25 mg total) by mouth 3 (three) times daily as needed for dizziness. 21 tablet 0   Multiple Vitamins-Minerals (PRESERVISION AREDS 2 PO) Take 1 capsule by mouth 2 (two) times daily.     pantoprazole (PROTONIX) 40 MG tablet TAKE 1 TABLET BY MOUTH ONCE DAILY. TAKE 30 MINUTES BEFORE BREAKFAST 90 tablet 1   pravastatin (PRAVACHOL) 40 MG tablet Take 1 tablet (40 mg total) by mouth daily. 90 tablet 1   ramipril (ALTACE) 10 MG capsule Take 1 capsule (10 mg total) by mouth daily. 90 capsule 1   triamcinolone cream (KENALOG)  0.1 % Apply 1 Application topically daily. (Patient taking differently: Apply 1 Application topically daily as needed (rash).) 80 g 3   No current facility-administered medications for this visit.    Allergies as of 02/07/2023   (No Known Allergies)    Family History  Problem Relation Age of Onset   Hypertension Sister     Social History   Socioeconomic History   Marital status: Widowed    Spouse name: Not on file   Number of children: Not on file   Years of education: Not on file   Highest education level: Not on file  Occupational History   Not on file  Tobacco Use   Smoking status: Former    Packs/day: .5    Types: Cigarettes    Quit date: 08/04/1995    Years since quitting: 27.5    Passive exposure: Past   Smokeless tobacco: Never  Vaping Use   Vaping Use: Never used  Substance and Sexual Activity   Alcohol use: Not Currently    Comment: rare   Drug use: Never   Sexual activity: Yes    Birth control/protection: None  Other Topics Concern   Not on file  Social History Narrative   ** Merged History Encounter **       Social Determinants of Health   Financial Resource Strain: Low Risk   (08/24/2022)   Overall Financial Resource Strain (CARDIA)    Difficulty of Paying Living Expenses: Not hard at all  Food Insecurity: No Food Insecurity (08/24/2022)   Hunger Vital Sign    Worried About Running Out of Food in the Last Year: Never true    Ran Out of Food in the Last Year: Never true  Transportation Needs: No Transportation Needs (08/24/2022)   PRAPARE - Administrator, Civil Service (Medical): No    Lack of Transportation (Non-Medical): No  Physical Activity: Insufficiently Active (08/24/2022)   Exercise Vital Sign    Days of Exercise per Week: 5 days    Minutes of Exercise per Session: 20 min  Stress: No Stress Concern Present (08/24/2022)   [employer] of Occupational Health - Occupational Stress Questionnaire    Feeling of Stress : Not at all  Social Connections: Moderately Isolated (08/24/2022)   Social Connection and Isolation Panel [NHANES]    Frequency of Communication with Friends and Family: More than three times a week    Frequency of Social Gatherings with Friends and Family: Twice a week    Attends Religious Services: More than 4 times per year    Active Member of [employer] or Organizations: No    Attends Banker Meetings: Never    Marital Status: Widowed   Review of systems General: negative for malaise, night sweats, fever, chills,+weight loss  Neck: Negative for lumps, goiter, pain and significant neck swelling Resp: Negative for cough, wheezing, dyspnea at rest CV: Negative for chest pain, leg swelling, palpitations, orthopnea GI: denies melena, hematochezia, nausea, vomiting, diarrhea, constipation, dysphagia, odyonophagia, early satiety. +unintentional weight loss.  MSK: Negative for joint pain or swelling, back pain, and muscle pain. Derm: Negative for itching or rash Psych: Denies depression, anxiety, memory loss, confusion. No homicidal or suicidal ideation.  Heme: Negative for prolonged bleeding, bruising easily, and  swollen nodes. Endocrine: Negative for cold or heat intolerance, polyuria, polydipsia and goiter. Neuro: negative for tremor, gait imbalance, syncope and seizures. The remainder of the review of systems is noncontributory.  Physical Exam: There were no vitals taken for  this visit. General:   Alert and oriented. No distress noted. Pleasant and cooperative.  Head:  Normocephalic and atraumatic. Eyes:  Conjuctiva clear without scleral icterus. Mouth:  Oral mucosa pink and moist. Good dentition. No lesions. Heart: Normal rate and rhythm, s1 and s2 heart sounds present.  Lungs: Clear lung sounds in all lobes. Respirations equal and unlabored. Abdomen:  +BS, soft, non-tender and non-distended. No rebound or guarding. No HSM or masses noted. Derm: No palmar erythema or jaundice Msk:  Symmetrical without gross deformities. Normal posture. Extremities:  Without edema. Neurologic:  Alert and  oriented x4 Psych:  Alert and cooperative. Normal mood and affect.  Invalid input(s): "6 MONTHS"   ASSESSMENT: GERLAD PELZEL is a 82 y.o. male presenting today for follow up of IDA and weight loss  IDA: endoscopic workup as above with EGD/Colonoscopy and givens capsule study without definitive etiology determined. Celiac testing negative. Possibly some aspect of iron malabsorption. Advised to continue on daily PO iron as last iron studies in January were WNL. Patient recently starting taking iron every 2-3 days due to constipation. He has no rectal bleeding, melena, fatigue, sob or dizziness. I recommended he resume more frequent dosing of PO iron, can take every other day, continue with good water intake and Start taking Miralax 1 capful every day for one week. If bowel movements do not improve, increase to 1 capful every 12 hours. If after two weeks there is no improvement, increase to 1 capful every 8 hours. He should make me aware if he develops sob, dizziness or fatigue.   Patient has continued to  have some gradual weight loss, down to 118 lbs today, he was 124 in December 2023. Feels appetite is good. He is doing protein shakes maybe once a day. TSH in 2023 was WNL. Endoscopic workup as above. Notably he had CT of the chest in April 2023 with some scarring and a pulmonary nodule thought to be benign though 1 year follow up recommended. I discussed this with the patient and his daughter and encouraged them to speak with PCP about need for repeat imaging of the chest. We will continue to follow his weight trend closely. Should continue to liberalize diet and do protein shakes 1-2x/day between meals.   PLAN:  Take iron pills every other day  2. Start miralax PRN  3.  Discuss follow up Chest CT with PCP 4. Continue with good water intake 5. Liberalize diet and do protein shakes 1-2x/day  All questions were answered, patient verbalized understanding and is in agreement with plan as outlined above.   Follow Up: 3 months   Eiza Canniff L. Jeanmarie Hubert, MSN, APRN, AGNP-C Adult-Gerontology Nurse Practitioner [facility]   I have reviewed the note and agree with the APP's assessment as described in this progress note  Katrinka Blazing, MD Gastroenterology and Hepatology [address] [facility]

## 2023-02-08 ENCOUNTER — Telehealth: Payer: Self-pay | Admitting: Family Medicine

## 2023-02-08 DIAGNOSIS — R911 Solitary pulmonary nodule: Secondary | ICD-10-CM

## 2023-02-08 NOTE — Telephone Encounter (Signed)
Nurses Patient is due for follow-up CT scan of the chest for pulmonary nodule Had previous CT in April 2023 and they recommended a follow-up scan in 1 years time It is now April 2024 so therefore please go ahead with ordering CT scan of the chest without contrast for the pulmonary nodule follow-up Diagnosis pulmonary nodule  Please call patient let him know that this is being set up so he can expect a phone call from [place] or radiology to set up the specific date and time

## 2023-02-08 NOTE — Telephone Encounter (Signed)
CT scan ordered in EPIC.  Patient notified

## 2023-02-10 ENCOUNTER — Ambulatory Visit: Payer: Medicare HMO | Admitting: Family Medicine

## 2023-02-13 ENCOUNTER — Telehealth: Payer: Self-pay | Admitting: Family Medicine

## 2023-02-13 NOTE — Telephone Encounter (Signed)
Front This patient has a regular office visit with me on May 9 The patient also has a CAT scan that we ordered to be completed on May 10 It would make sense for his office visit to be after 10th so we can discuss any findings from the CAT scan Please touch base with the patient Please move his appointment if he is willing to do so to the following week so that when we see him we would have the results of the CAT scan to discuss thank you  Hopefully he will be on board with that approach thank you

## 2023-02-21 ENCOUNTER — Telehealth: Payer: Self-pay

## 2023-02-21 DIAGNOSIS — D509 Iron deficiency anemia, unspecified: Secondary | ICD-10-CM

## 2023-02-21 NOTE — Telephone Encounter (Signed)
Recommend ferritin, CBC Iron deficient anemia  His other labs are up-to-date

## 2023-02-21 NOTE — Telephone Encounter (Signed)
Pt needs lab work ordered to check iron before his appt Thursday pt wants a call back when it is ordered.   Pt call back [phone]

## 2023-02-23 NOTE — Telephone Encounter (Signed)
Blood work ordered in EPIC.  Left message to return call to notify patient 

## 2023-02-23 NOTE — Addendum Note (Signed)
Addended by: Margaretha Sheffield on: 02/23/2023 01:49 PM   Modules accepted: Orders

## 2023-02-24 ENCOUNTER — Ambulatory Visit (INDEPENDENT_AMBULATORY_CARE_PROVIDER_SITE_OTHER): Payer: Medicare HMO | Admitting: Family Medicine

## 2023-02-24 VITALS — BP 122/68 | HR 60 | Ht 67.0 in | Wt 122.0 lb

## 2023-02-24 DIAGNOSIS — J8410 Pulmonary fibrosis, unspecified: Secondary | ICD-10-CM | POA: Diagnosis not present

## 2023-02-24 DIAGNOSIS — H548 Legal blindness, as defined in USA: Secondary | ICD-10-CM

## 2023-02-24 DIAGNOSIS — R911 Solitary pulmonary nodule: Secondary | ICD-10-CM | POA: Diagnosis not present

## 2023-02-24 DIAGNOSIS — D509 Iron deficiency anemia, unspecified: Secondary | ICD-10-CM | POA: Diagnosis not present

## 2023-02-24 DIAGNOSIS — I10 Essential (primary) hypertension: Secondary | ICD-10-CM | POA: Diagnosis not present

## 2023-02-24 DIAGNOSIS — E7849 Other hyperlipidemia: Secondary | ICD-10-CM | POA: Diagnosis not present

## 2023-02-24 DIAGNOSIS — J8417 Interstitial lung disease with progressive fibrotic phenotype in diseases classified elsewhere: Secondary | ICD-10-CM | POA: Diagnosis not present

## 2023-02-24 NOTE — Progress Notes (Signed)
Subjective:    Patient ID: Jason Spence, male    DOB: 22-May-1941, 82 y.o.   MRN: [id number]  HPI Patient arrives today for follow up. Patient states no concerns or issues today.  Outpatient Encounter Medications as of 02/24/2023  Medication Sig Note   albuterol (VENTOLIN HFA) 108 (90 Base) MCG/ACT inhaler Inhale 2 puffs into the lungs every 4 (four) hours as needed for wheezing.    Ascorbic Acid (VITAMIN C) 1000 MG tablet Take 1,000 mg by mouth daily.    aspirin 81 MG tablet Take 1 tablet (81 mg total) by mouth daily.    budesonide-formoterol (BREYNA) 160-4.5 MCG/ACT inhaler INHALE 2 PUFFS BY MOUTH TWICE DAILY -  RINSE  MOUTH  WITH  WATER  AFTER  EACH  USE    cetirizine (ZYRTEC ALLERGY) 10 MG tablet Take 1 tablet (10 mg total) by mouth daily.    Cholecalciferol (VITAMIN D3 PO) Take 1 tablet by mouth daily.    ferrous sulfate 325 (65 FE) MG tablet Take 1 tablet (325 mg total) by mouth daily with breakfast. 02/07/2023: Takes one every 2 -3 days    fish oil-omega-3 fatty acids 1000 MG capsule Take 1 g by mouth daily.     ketoconazole (NIZORAL) 2 % cream Apply 1 Application topically daily as needed for irritation.    meclizine (ANTIVERT) 25 MG tablet Take 1 tablet (25 mg total) by mouth 3 (three) times daily as needed for dizziness.    Multiple Vitamins-Minerals (PRESERVISION AREDS 2 PO) Take 1 capsule by mouth 2 (two) times daily.    pantoprazole (PROTONIX) 40 MG tablet TAKE 1 TABLET BY MOUTH ONCE DAILY. TAKE 30 MINUTES BEFORE BREAKFAST    pravastatin (PRAVACHOL) 40 MG tablet Take 1 tablet (40 mg total) by mouth daily.    ramipril (ALTACE) 10 MG capsule Take 1 capsule (10 mg total) by mouth daily.    triamcinolone cream (KENALOG) 0.1 % Apply 1 Application topically daily. (Patient taking differently: Apply 1 Application topically daily as needed (rash).)    No facility-administered encounter medications on file as of 02/24/2023.  Iron deficiency anemia, unspecified iron deficiency anemia  type  Pulmonary nodule  Other hyperlipidemia  Essential hypertension, benign  Legally blind  Pulmonary fibrosis (HCC)  Interstitial lung disease with progressive fibrotic phenotype in diseases classified elsewhere [facility]) Patient has seen gastroenterology recommend iron tablet every day along with stool softener Saw pulmonary they recommended just monitoring His vision seems to be getting worse under the care of ophthalmology Has pulmonary nodule CAT scan for tomorrow Iron deficiency follow-up labs in June we will notify patient of result   Labs are pending he will do these in mid June  Review of Systems     Objective:   Physical Exam  General-in no acute distress Eyes-no discharge Lungs-respiratory rate normal, CTA CV-no murmurs,RRR Extremities skin warm dry no edema Neuro grossly normal Behavior normal, alert       Assessment & Plan:  1. Iron deficiency anemia, unspecified iron deficiency anemia type We will check the labs in the June continue current tablet continue MiraLAX avoid NSAIDs  2. Pulmonary nodule CT scan pending for tomorrow we will call him with the result  3. Other hyperlipidemia Previous cholesterol profile look good continue current measures healthy diet  4. Essential hypertension, benign Blood pressure overall looks good continue current measures  5. Legally blind This will not get any better family helps him out he stays with family  6. Pulmonary fibrosis (HCC) Stable  [facility] does not recommend any intervention currently they will follow him up in 1 years time We did discuss proper vaccinations Right knee pain and discomfort May use Voltaren gel may use Tylenol Patient to do follow-up visit in the fall time

## 2023-02-25 ENCOUNTER — Ambulatory Visit (HOSPITAL_COMMUNITY)
Admission: RE | Admit: 2023-02-25 | Discharge: 2023-02-25 | Disposition: A | Discharge status: Home / Self Care | Payer: Medicare HMO | Source: Ambulatory Visit | Attending: Family Medicine | Admitting: Family Medicine

## 2023-02-25 ENCOUNTER — Encounter (HOSPITAL_COMMUNITY): Payer: Self-pay

## 2023-02-25 DIAGNOSIS — R911 Solitary pulmonary nodule: Secondary | ICD-10-CM | POA: Insufficient documentation

## 2023-02-25 DIAGNOSIS — J181 Lobar pneumonia, unspecified organism: Secondary | ICD-10-CM | POA: Diagnosis not present

## 2023-03-01 ENCOUNTER — Ambulatory Visit: Payer: Medicare HMO | Admitting: Physician Assistant

## 2023-03-01 DIAGNOSIS — M2011 Hallux valgus (acquired), right foot: Secondary | ICD-10-CM | POA: Diagnosis not present

## 2023-03-01 DIAGNOSIS — B351 Tinea unguium: Secondary | ICD-10-CM | POA: Diagnosis not present

## 2023-03-01 DIAGNOSIS — M79671 Pain in right foot: Secondary | ICD-10-CM | POA: Diagnosis not present

## 2023-03-01 DIAGNOSIS — L84 Corns and callosities: Secondary | ICD-10-CM | POA: Diagnosis not present

## 2023-03-04 ENCOUNTER — Other Ambulatory Visit: Payer: Self-pay | Admitting: Family Medicine

## 2023-03-04 MED ORDER — AMOXICILLIN 500 MG PO CAPS: ORAL_CAPSULE | ORAL | 0 refills | Status: DC

## 2023-03-04 MED ORDER — AZITHROMYCIN 250 MG PO TABS: ORAL_TABLET | ORAL | 0 refills | Status: AC

## 2023-03-11 NOTE — Telephone Encounter (Signed)
Patient seen in office 02/24/23 and is aware of lab work orders

## 2023-03-28 DIAGNOSIS — H35033 Hypertensive retinopathy, bilateral: Secondary | ICD-10-CM | POA: Diagnosis not present

## 2023-03-28 DIAGNOSIS — H43813 Vitreous degeneration, bilateral: Secondary | ICD-10-CM | POA: Diagnosis not present

## 2023-03-28 DIAGNOSIS — H353231 Exudative age-related macular degeneration, bilateral, with active choroidal neovascularization: Secondary | ICD-10-CM | POA: Diagnosis not present

## 2023-04-04 DIAGNOSIS — D509 Iron deficiency anemia, unspecified: Secondary | ICD-10-CM | POA: Diagnosis not present

## 2023-04-05 ENCOUNTER — Other Ambulatory Visit: Payer: Self-pay | Admitting: Family Medicine

## 2023-04-05 LAB — CBC WITH DIFFERENTIAL/PLATELET
Basophils Absolute: 0.1 10*3/uL (ref 0.0–0.2)
Basos: 1 %
EOS (ABSOLUTE): 0.7 10*3/uL — ABNORMAL HIGH (ref 0.0–0.4)
Eos: 10 %
Hematocrit: 40 % (ref 37.5–51.0)
Hemoglobin: 13.3 g/dL (ref 13.0–17.7)
Immature Grans (Abs): 0 10*3/uL (ref 0.0–0.1)
Immature Granulocytes: 0 %
Lymphocytes Absolute: 1.4 10*3/uL (ref 0.7–3.1)
Lymphs: 18 %
MCH: 29.2 pg (ref 26.6–33.0)
MCHC: 33.3 g/dL (ref 31.5–35.7)
MCV: 88 fL (ref 79–97)
Monocytes Absolute: 0.6 10*3/uL (ref 0.1–0.9)
Monocytes: 8 %
Neutrophils Absolute: 4.9 10*3/uL (ref 1.4–7.0)
Neutrophils: 63 %
Platelets: 237 10*3/uL (ref 150–450)
RBC: 4.56 x10E6/uL (ref 4.14–5.80)
RDW: 13 % (ref 11.6–15.4)
WBC: 7.7 10*3/uL (ref 3.4–10.8)

## 2023-04-05 LAB — FERRITIN: Ferritin: 48 ng/mL (ref 30–400)

## 2023-05-10 DIAGNOSIS — M79675 Pain in left toe(s): Secondary | ICD-10-CM | POA: Diagnosis not present

## 2023-05-10 DIAGNOSIS — L84 Corns and callosities: Secondary | ICD-10-CM | POA: Diagnosis not present

## 2023-05-10 DIAGNOSIS — M79674 Pain in right toe(s): Secondary | ICD-10-CM | POA: Diagnosis not present

## 2023-05-10 DIAGNOSIS — B351 Tinea unguium: Secondary | ICD-10-CM | POA: Diagnosis not present

## 2023-05-26 ENCOUNTER — Ambulatory Visit (INDEPENDENT_AMBULATORY_CARE_PROVIDER_SITE_OTHER): Payer: Medicare HMO | Admitting: Gastroenterology

## 2023-05-26 ENCOUNTER — Encounter (INDEPENDENT_AMBULATORY_CARE_PROVIDER_SITE_OTHER): Payer: Self-pay | Admitting: Gastroenterology

## 2023-05-26 VITALS — BP 136/66 | HR 57 | Temp 97.9°F | Ht 67.0 in | Wt 120.4 lb

## 2023-05-26 DIAGNOSIS — D509 Iron deficiency anemia, unspecified: Secondary | ICD-10-CM

## 2023-05-26 DIAGNOSIS — R634 Abnormal weight loss: Secondary | ICD-10-CM

## 2023-05-26 MED ORDER — FERROUS SULFATE 325 (65 FE) MG PO TABS: 325.0000 mg | ORAL_TABLET | Freq: Every day | ORAL | 3 refills | Status: DC

## 2023-05-26 NOTE — Patient Instructions (Signed)
Continue with miralax daily as you are doing, make sure water intake is good  Please continue to take iron pill once daily, I have sent a refill for this Continue to liberalize your diet and eat what you like/tolerate, can keep doing protein shake once daily as well for added nutrition  We will plan to see you back in 1 year unless you have new or worsening GI symptoms  It was a pleasure to see you today. I want to create trusting relationships with patients and provide genuine, compassionate, and quality care. I truly value your feedback! please be on the lookout for a survey regarding your visit with me today. I appreciate your input about our visit and your time in completing this!     L. Jeanmarie Hubert, MSN, APRN, AGNP-C Adult-Gerontology Nurse Practitioner [facility] at [facility]

## 2023-05-26 NOTE — Progress Notes (Addendum)
Referring Provider: Babs Sciara, MD Primary Care Physician:  Babs Sciara, MD Primary GI Physician: Dr. Levon Hedger   Chief Complaint  Patient presents with   Anemia    Follow up anemia. Takes 2 iron tablets daily    Weight Loss    Follow up on weight loss. Does not do protein shakes daily but does drink some. States appetite is good. Eats 3 meals a day    HPI:   Jason Spence is a 82 y.o. male with past medical history of GERD, IDA   Patient presenting today for follow up of anemia and weight loss  Last seen April 2024, at that time doing well, taking iron every 2-3 days as he felt more constipated on daily dosing. Appetite good, doing a protein shake once a day usually. Weight at 118 lbs  Recommended to continue with daily PO iron, start miralax for constipation, advised to follow up with PCP regarding repeat CT chest which he had repeat of in may 2024.   Present:  Most recent iron studies in January with iron 148, tibc 322, sat 46 Hgb 13.3 and ferritin 48 in June  Patient arrives with his daughter in [place]. He states he is feeling well. He is taking his iron pills, mostly everyday though sometimes he will skip a day if he feels he is becoming more constipated. He is taking miralax usually everyday, taking 1 capful which provides good results for him. Denies any abdominal pain. He is eating well per his daughter in law. He does a protein shake usually once per day unless he forgets. Weight is stable, he has gained 2 pounds since his last visit. He tries to stay active, gets out often and walks his dog.   No red flag symptoms. Patient denies melena, hematochezia, nausea, vomiting, diarrhea, dysphagia, odyonophagia, early satiety or weight loss.   Givens capsule study: 08/24/22: Slight congestion in the proximal small bowel without bleeding. No other abnormalities.  Last Colonoscopy:07/2022 - The examined portion of the ileum was normal. - Two 3 to 4 mm polyps in the  transverse colon and in the cecum - Two 3 to 4 mm polyps in the rectum, removed with a cold snare. (3 TAs, 1 HP polyp)  - Diverticulosis in the sigmoid colon, in the descending colon and in the transverse colon. - Non-bleeding internal hemorrhoids. Last Endoscopy: 07/2022 - 1 cm sliding hiatal hernia. - Gastroesophageal flap valve classified as Hill   Grade II (fold present, opens with respiration). - Normal stomach.  - Normal examined duodenum. Biopsied-normal      Past Medical History:  Diagnosis Date   Anal stricture 10/18/2006   Arthritis    Hypercholesteremia    Hypertension    Myocardial infarct [facility])     Past Surgical History:  Procedure Laterality Date   BALLOON DILATION N/A 01/05/2013   Procedure: BALLOON DILATION;  Surgeon: Malissa Hippo, MD;  [facility];  Service: Endoscopy;  Laterality: N/A;   BIOPSY  02/27/2018   Procedure: BIOPSY;  Surgeon: Malissa Hippo, MD;  [facility];  Service: Endoscopy;;  esophagus   BIOPSY  08/05/2022   Procedure: BIOPSY;  Surgeon: Dolores Frame, MD;  [facility];  Service: Gastroenterology;;   COLONOSCOPY WITH ESOPHAGOGASTRODUODENOSCOPY (EGD) N/A 01/05/2013   Procedure: COLONOSCOPY WITH ESOPHAGOGASTRODUODENOSCOPY (EGD);  Surgeon: Malissa Hippo, MD;  [facility];  Service: Endoscopy;  Laterality: N/A;  100   COLONOSCOPY WITH PROPOFOL  N/A 08/05/2022   Procedure: COLONOSCOPY WITH PROPOFOL;  Surgeon: Dolores Frame, MD;  [facility];  Service: Gastroenterology;  Laterality: N/A;  1030 ASA 3   CORONARY ARTERY BYPASS GRAFT     ESOPHAGEAL DILATION N/A 02/27/2018   Procedure: ESOPHAGEAL DILATION;  Surgeon: Malissa Hippo, MD;  [facility];  Service: Endoscopy;  Laterality: N/A;   ESOPHAGOGASTRODUODENOSCOPY N/A 02/27/2018   Procedure: ESOPHAGOGASTRODUODENOSCOPY (EGD);  Surgeon: Malissa Hippo, MD;  [facility];  Service: Endoscopy;   Laterality: N/A;  12:25   ESOPHAGOGASTRODUODENOSCOPY (EGD) WITH PROPOFOL N/A 08/05/2022   Procedure: ESOPHAGOGASTRODUODENOSCOPY (EGD) WITH PROPOFOL;  Surgeon: Dolores Frame, MD;  [facility];  Service: Gastroenterology;  Laterality: N/A;   GIVENS CAPSULE STUDY N/A 08/24/2022   Procedure: GIVENS CAPSULE STUDY;  Surgeon: Dolores Frame, MD;  [facility];  Service: Gastroenterology;  Laterality: N/A;  730 am   HERNIA REPAIR     POLYPECTOMY  08/05/2022   Procedure: POLYPECTOMY;  Surgeon: Marguerita Merles, Reuel Boom, MD;  [facility];  Service: Gastroenterology;;    Current Outpatient Medications  Medication Sig Dispense Refill   albuterol (VENTOLIN HFA) 108 (90 Base) MCG/ACT inhaler Inhale 2 puffs into the lungs every 4 (four) hours as needed for wheezing. 1 each 2   Ascorbic Acid (VITAMIN C) 1000 MG tablet Take 1,000 mg by mouth daily.     aspirin 81 MG tablet Take 1 tablet (81 mg total) by mouth daily. 30 tablet    budesonide-formoterol (BREYNA) 160-4.5 MCG/ACT inhaler INHALE 2 PUFFS BY MOUTH TWICE DAILY -  RINSE  MOUTH  WITH  WATER  AFTER  EACH  USE 11 g 5   cetirizine (ZYRTEC ALLERGY) 10 MG tablet Take 1 tablet (10 mg total) by mouth daily. 30 tablet 2   Cholecalciferol (VITAMIN D3 PO) Take 1 tablet by mouth daily.     ferrous sulfate 325 (65 FE) MG tablet Take 1 tablet (325 mg total) by mouth daily with breakfast. 90 tablet 3   fish oil-omega-3 fatty acids 1000 MG capsule Take 1 g by mouth daily.      ketoconazole (NIZORAL) 2 % cream Apply 1 Application topically daily as needed for irritation. 15 g 1   meclizine (ANTIVERT) 25 MG tablet Take 1 tablet (25 mg total) by mouth 3 (three) times daily as needed for dizziness. 21 tablet 0   Multiple Vitamins-Minerals (PRESERVISION AREDS 2 PO) Take 1 capsule by mouth 2 (two) times daily.     pantoprazole (PROTONIX) 40 MG tablet TAKE 1 TABLET BY MOUTH ONCE DAILY. TAKE 30 MINUTES BEFORE BREAKFAST  90 tablet 1   pravastatin (PRAVACHOL) 40 MG tablet Take 1 tablet (40 mg total) by mouth daily. 90 tablet 1   ramipril (ALTACE) 10 MG capsule Take 1 capsule by mouth once daily 90 capsule 0   triamcinolone cream (KENALOG) 0.1 % Apply 1 Application topically daily. (Patient taking differently: Apply 1 Application topically daily as needed (rash).) 80 g 3   No current facility-administered medications for this visit.    Allergies as of 05/26/2023   (No Known Allergies)    Family History  Problem Relation Age of Onset   Hypertension Sister     Social History   Socioeconomic History   Marital status: Widowed    Spouse name: Not on file   Number of children: Not on file   Years of education: Not on file   Highest education level: Not on  file  Occupational History   Not on file  Tobacco Use   Smoking status: Former    Current packs/day: 0.00    Types: Cigarettes    Quit date: 08/04/1995    Years since quitting: 27.8    Passive exposure: Past   Smokeless tobacco: Never  Vaping Use   Vaping status: Never Used  Substance and Sexual Activity   Alcohol use: Not Currently    Comment: rare   Drug use: Never   Sexual activity: Yes    Birth control/protection: None  Other Topics Concern   Not on file  Social History Narrative   ** Merged History Encounter **       Social Determinants of Health   Financial Resource Strain: Low Risk  (08/24/2022)   Overall Financial Resource Strain (CARDIA)    Difficulty of Paying Living Expenses: Not hard at all  Food Insecurity: No Food Insecurity (08/24/2022)   Hunger Vital Sign    Worried About Running Out of Food in the Last Year: Never true    Ran Out of Food in the Last Year: Never true  Transportation Needs: No Transportation Needs (08/24/2022)   PRAPARE - Administrator, Civil Service (Medical): No    Lack of Transportation (Non-Medical): No  Physical Activity: Insufficiently Active (08/24/2022)   Exercise Vital Sign     Days of Exercise per Week: 5 days    Minutes of Exercise per Session: 20 min  Stress: No Stress Concern Present (08/24/2022)   [employer] of Occupational Health - Occupational Stress Questionnaire    Feeling of Stress : Not at all  Social Connections: Moderately Isolated (08/24/2022)   Social Connection and Isolation Panel [NHANES]    Frequency of Communication with Friends and Family: More than three times a week    Frequency of Social Gatherings with Friends and Family: Twice a week    Attends Religious Services: More than 4 times per year    Active Member of [employer] or Organizations: No    Attends Banker Meetings: Never    Marital Status: Widowed    Review of systems General: negative for malaise, night sweats, fever, chills, weight loss Neck: Negative for lumps, goiter, pain and significant neck swelling Resp: Negative for cough, wheezing, dyspnea at rest CV: Negative for chest pain, leg swelling, palpitations, orthopnea GI: denies melena, hematochezia, nausea, vomiting, diarrhea, constipation, dysphagia, odyonophagia, early satiety or unintentional weight loss.  MSK: Negative for joint pain or swelling, back pain, and muscle pain. Derm: Negative for itching or rash Psych: Denies depression, anxiety, memory loss, confusion. No homicidal or suicidal ideation.  Heme: Negative for prolonged bleeding, bruising easily, and swollen nodes. Endocrine: Negative for cold or heat intolerance, polyuria, polydipsia and goiter. Neuro: negative for tremor, gait imbalance, syncope and seizures. The remainder of the review of systems is noncontributory.  Physical Exam: BP 136/66   Pulse (!) 57   Temp 97.9 F (36.6 C) (Oral)   Ht 5\' 7"  (1.702 m)   Wt 120 lb 6.4 oz (54.6 kg)   BMI 18.86 kg/m  General:   Alert and oriented. No distress noted. Pleasant and cooperative.  Head:  Normocephalic and atraumatic. Eyes:  Conjuctiva clear without scleral icterus. Mouth:  Oral mucosa  pink and moist. Good dentition. No lesions. Heart: Normal rate and rhythm, s1 and s2 heart sounds present.  Lungs: Clear lung sounds in all lobes. Respirations equal and unlabored. Abdomen:  +BS, soft, non-tender and non-distended. No rebound or  guarding. No HSM or masses noted. Derm: No palmar erythema or jaundice Msk:  Symmetrical without gross deformities. Normal posture. Extremities:  Without edema. Neurologic:  Alert and  oriented x4 Psych:  Alert and cooperative. Normal mood and affect.  Invalid input(s): "6 MONTHS"   ASSESSMENT: Jason Spence is a 82 y.o. male presenting today for follow up of weight loss and IDA  IDA: Endoscopic workup as above with EGD/Colonoscopy/givens capsule study without definitive etiology determined. No rectal bleeding or melena. Celiac testing negative in the past. Possibly some aspect of iron malabsorption. Advised to continue on daily PO iron as last iron studies in January were WNL, ferritin and hgb stable in June. Iron induced constipation is well managed with miralax which I encouraged him to continue as well as good water intake.   Weight loss: weight stable between 118-124 since December 2023, he has gained 2 pounds since his last visit with me. Appetite is good. He is doing a protein shake daily. Recommend he continue to liberalize his diet and eat what he likes/tolerates as well as continued protein shake daily for added nutrition.    PLAN:  Continue miralax 1 capful daily  2. Continue PO iron daily  3. Liberalize diet 4. Protein shake daily   All questions were answered, patient verbalized understanding and is in agreement with plan as outlined above.    Follow Up: 1 year   L. Jeanmarie Hubert, MSN, APRN, AGNP-C Adult-Gerontology Nurse Practitioner [name]  I have reviewed the note and agree with the APP's assessment as described in this progress note  Katrinka Blazing, MD Gastroenterology and  Hepatology [facility] Gastroenterology

## 2023-06-20 ENCOUNTER — Other Ambulatory Visit: Payer: Self-pay | Admitting: Family Medicine

## 2023-06-24 IMAGING — DX DG CHEST 2V
2 series · 2 of 2 positions shown · non-contrast
Comparison: August 12, 2021.

CLINICAL DATA: Cough, shortness of breath.

EXAM:
CHEST - 2 VIEW

[chest pa]
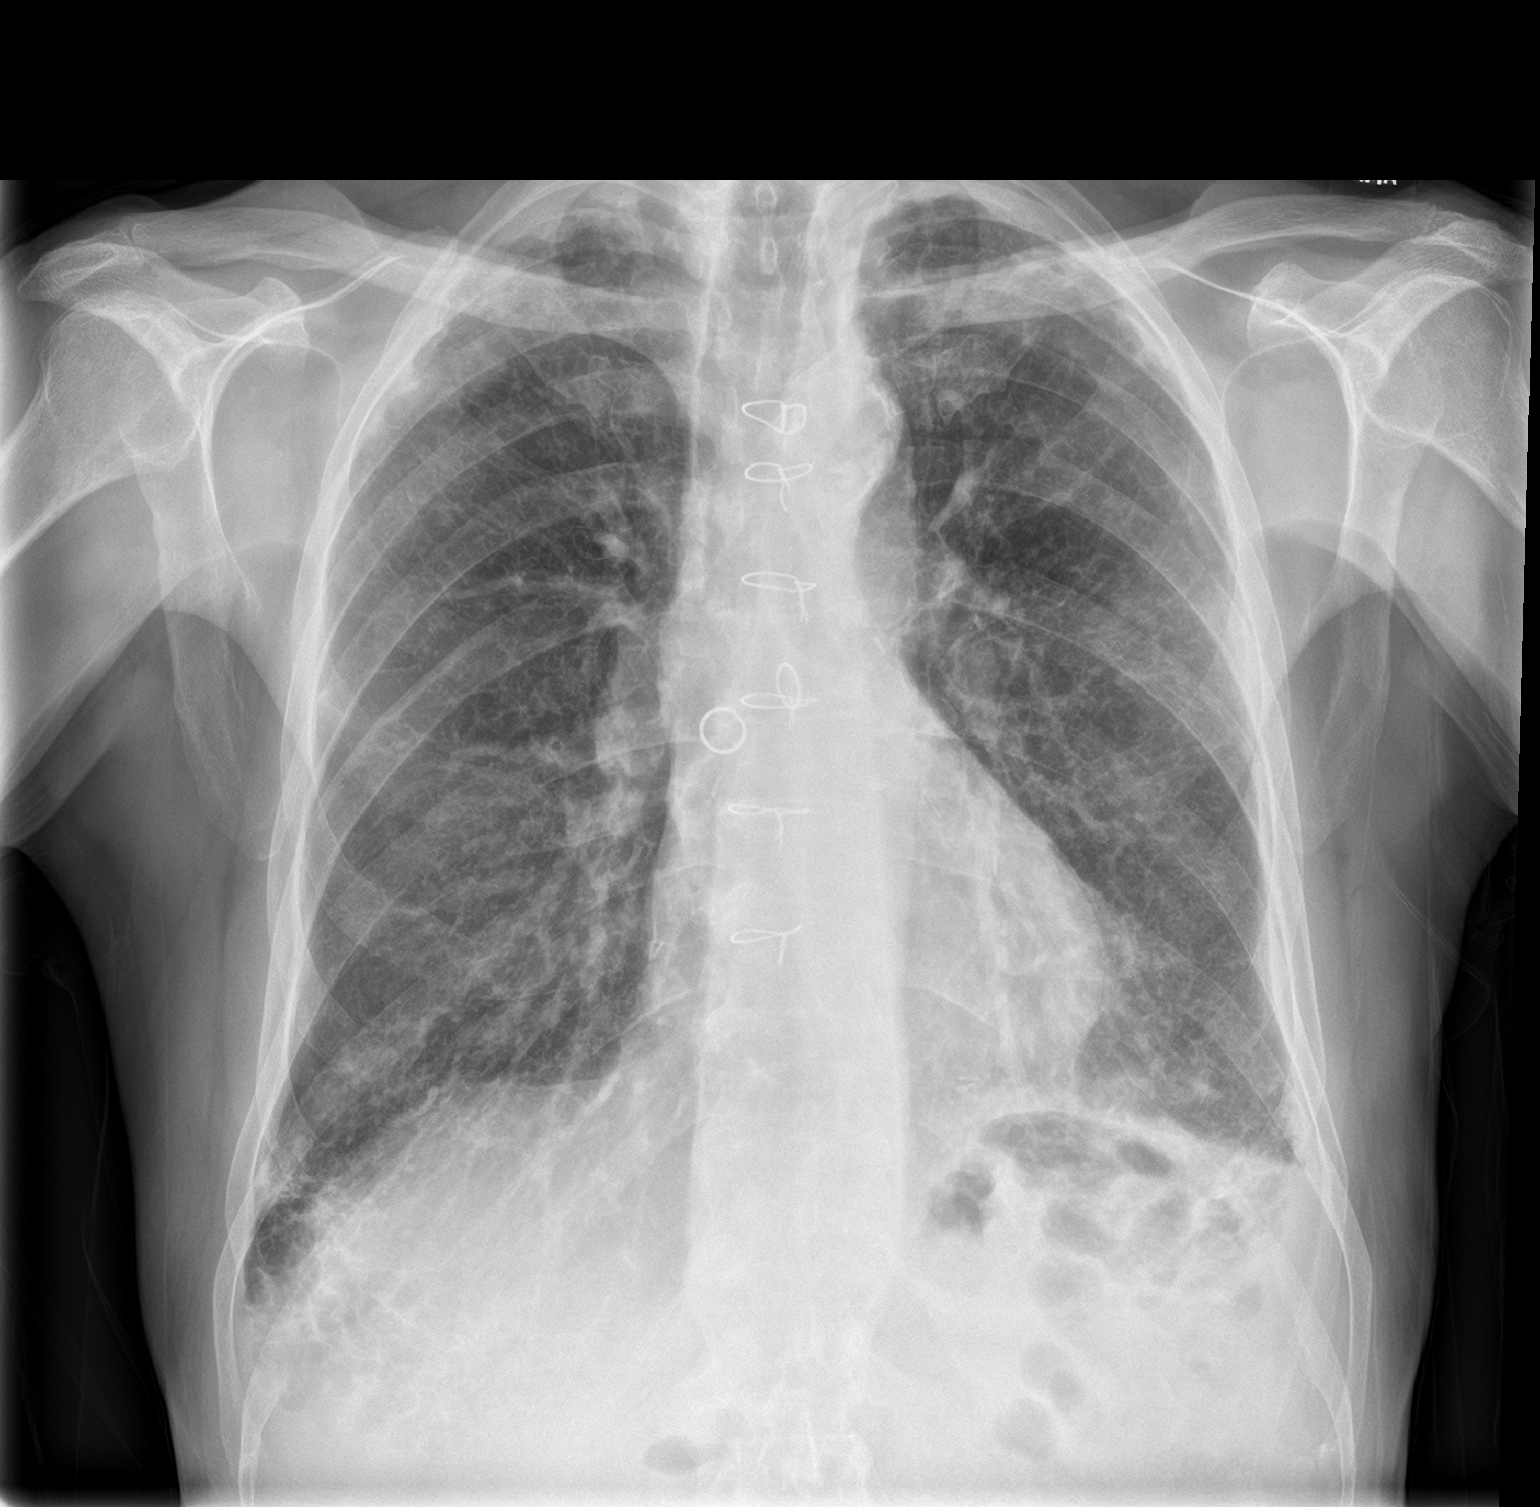

[chest lat]
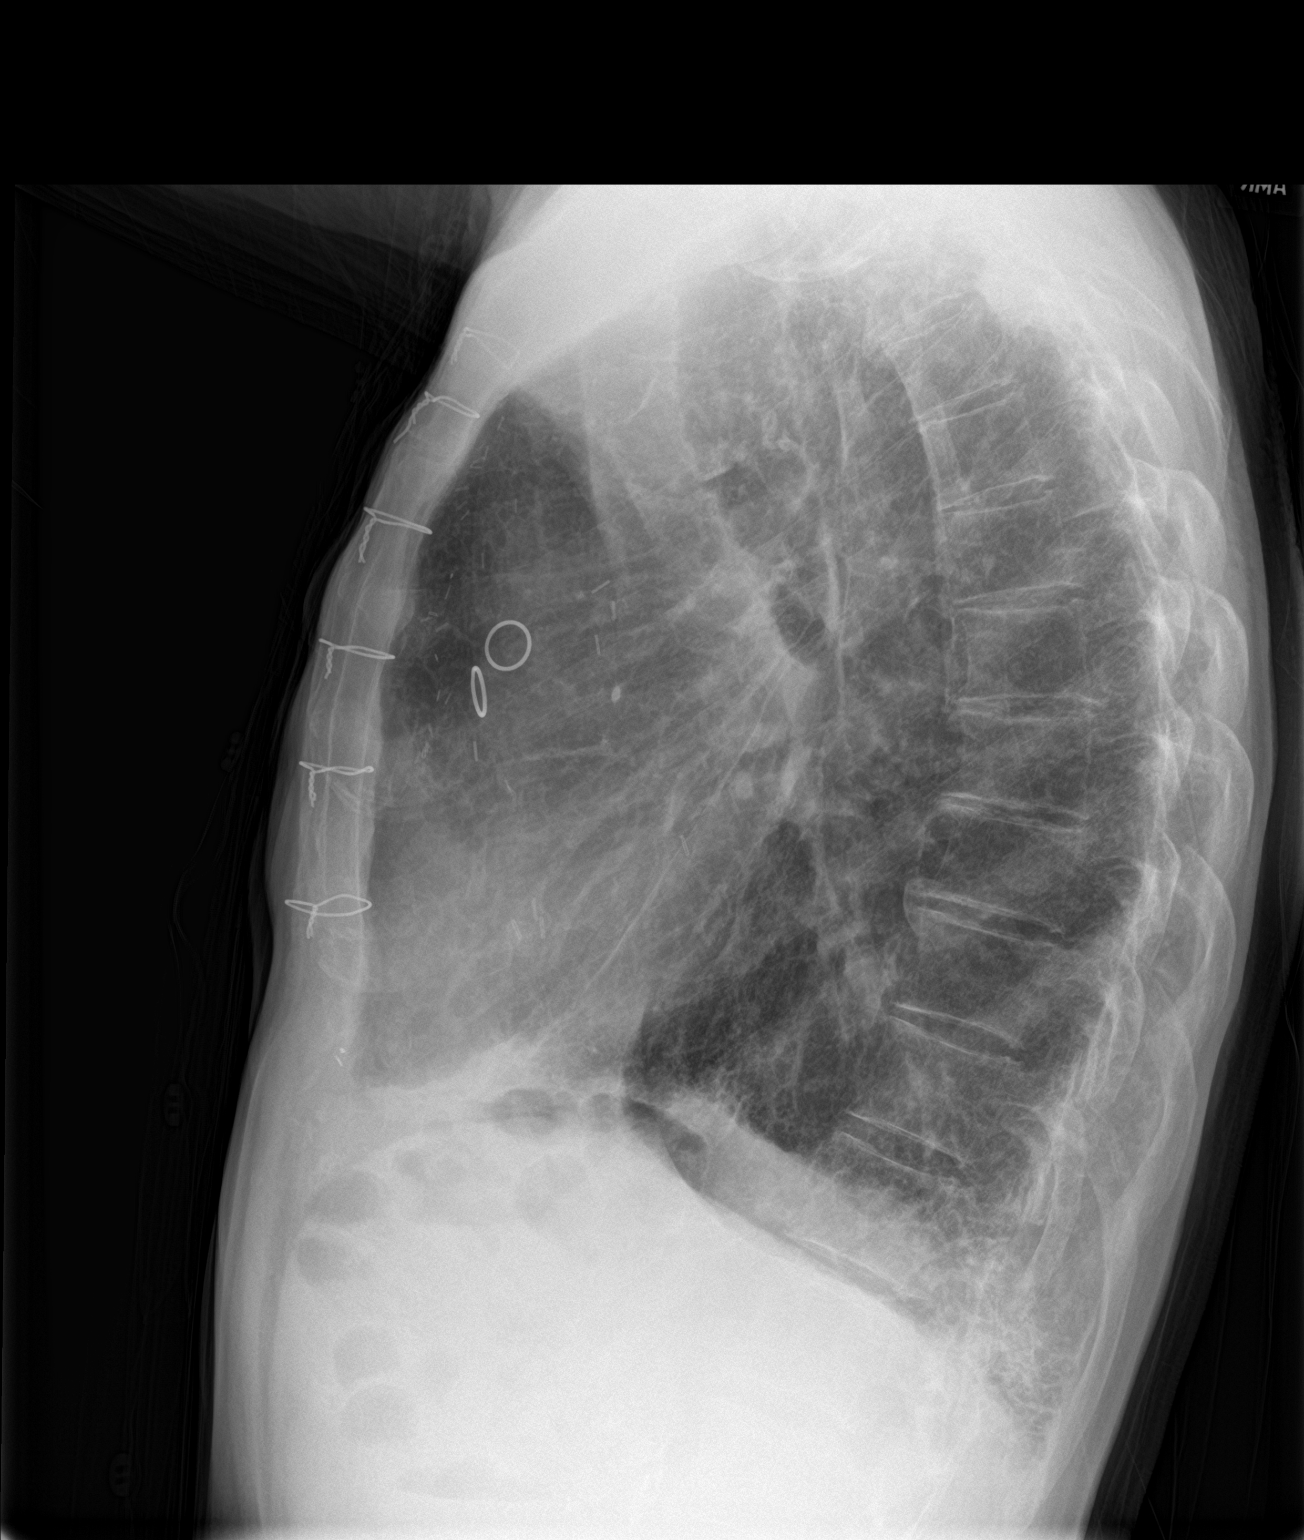

[2 of 2 positions shown; findings below may reference images not displayed]

FINDINGS: The heart size and mediastinal contours are within normal limits.
Status post coronary bypass graft. Stable biapical scarring is
noted. Diffuse interstitial densities are noted most consistent with
scarring. Acute superimposed inflammation cannot be excluded. The
visualized skeletal structures are unremarkable.
IMPRESSION: Stable diffuse interstitial densities are noted most consistent with
scarring or chronic interstitial lung disease. Acute superimposed
inflammation cannot be excluded.

Aortic Atherosclerosis ([id number]-[id number]).

## 2023-07-19 DIAGNOSIS — B351 Tinea unguium: Secondary | ICD-10-CM | POA: Diagnosis not present

## 2023-07-19 DIAGNOSIS — M79674 Pain in right toe(s): Secondary | ICD-10-CM | POA: Diagnosis not present

## 2023-07-19 DIAGNOSIS — M79675 Pain in left toe(s): Secondary | ICD-10-CM | POA: Diagnosis not present

## 2023-07-20 ENCOUNTER — Telehealth: Payer: Self-pay | Admitting: Family Medicine

## 2023-07-20 DIAGNOSIS — I10 Essential (primary) hypertension: Secondary | ICD-10-CM

## 2023-07-20 DIAGNOSIS — Z79899 Other long term (current) drug therapy: Secondary | ICD-10-CM

## 2023-07-20 DIAGNOSIS — E7849 Other hyperlipidemia: Secondary | ICD-10-CM

## 2023-07-20 NOTE — Telephone Encounter (Signed)
Will patient need labs done for appointment on 10/9. Please leave message with Daughter April

## 2023-07-21 IMAGING — DX DG CHEST 2V
2 series · 2 of 2 positions shown · non-contrast
Comparison: 08/12/2021, 09/16/2021

CLINICAL DATA: Cough

EXAM:
CHEST - 2 VIEW

[chest pa]
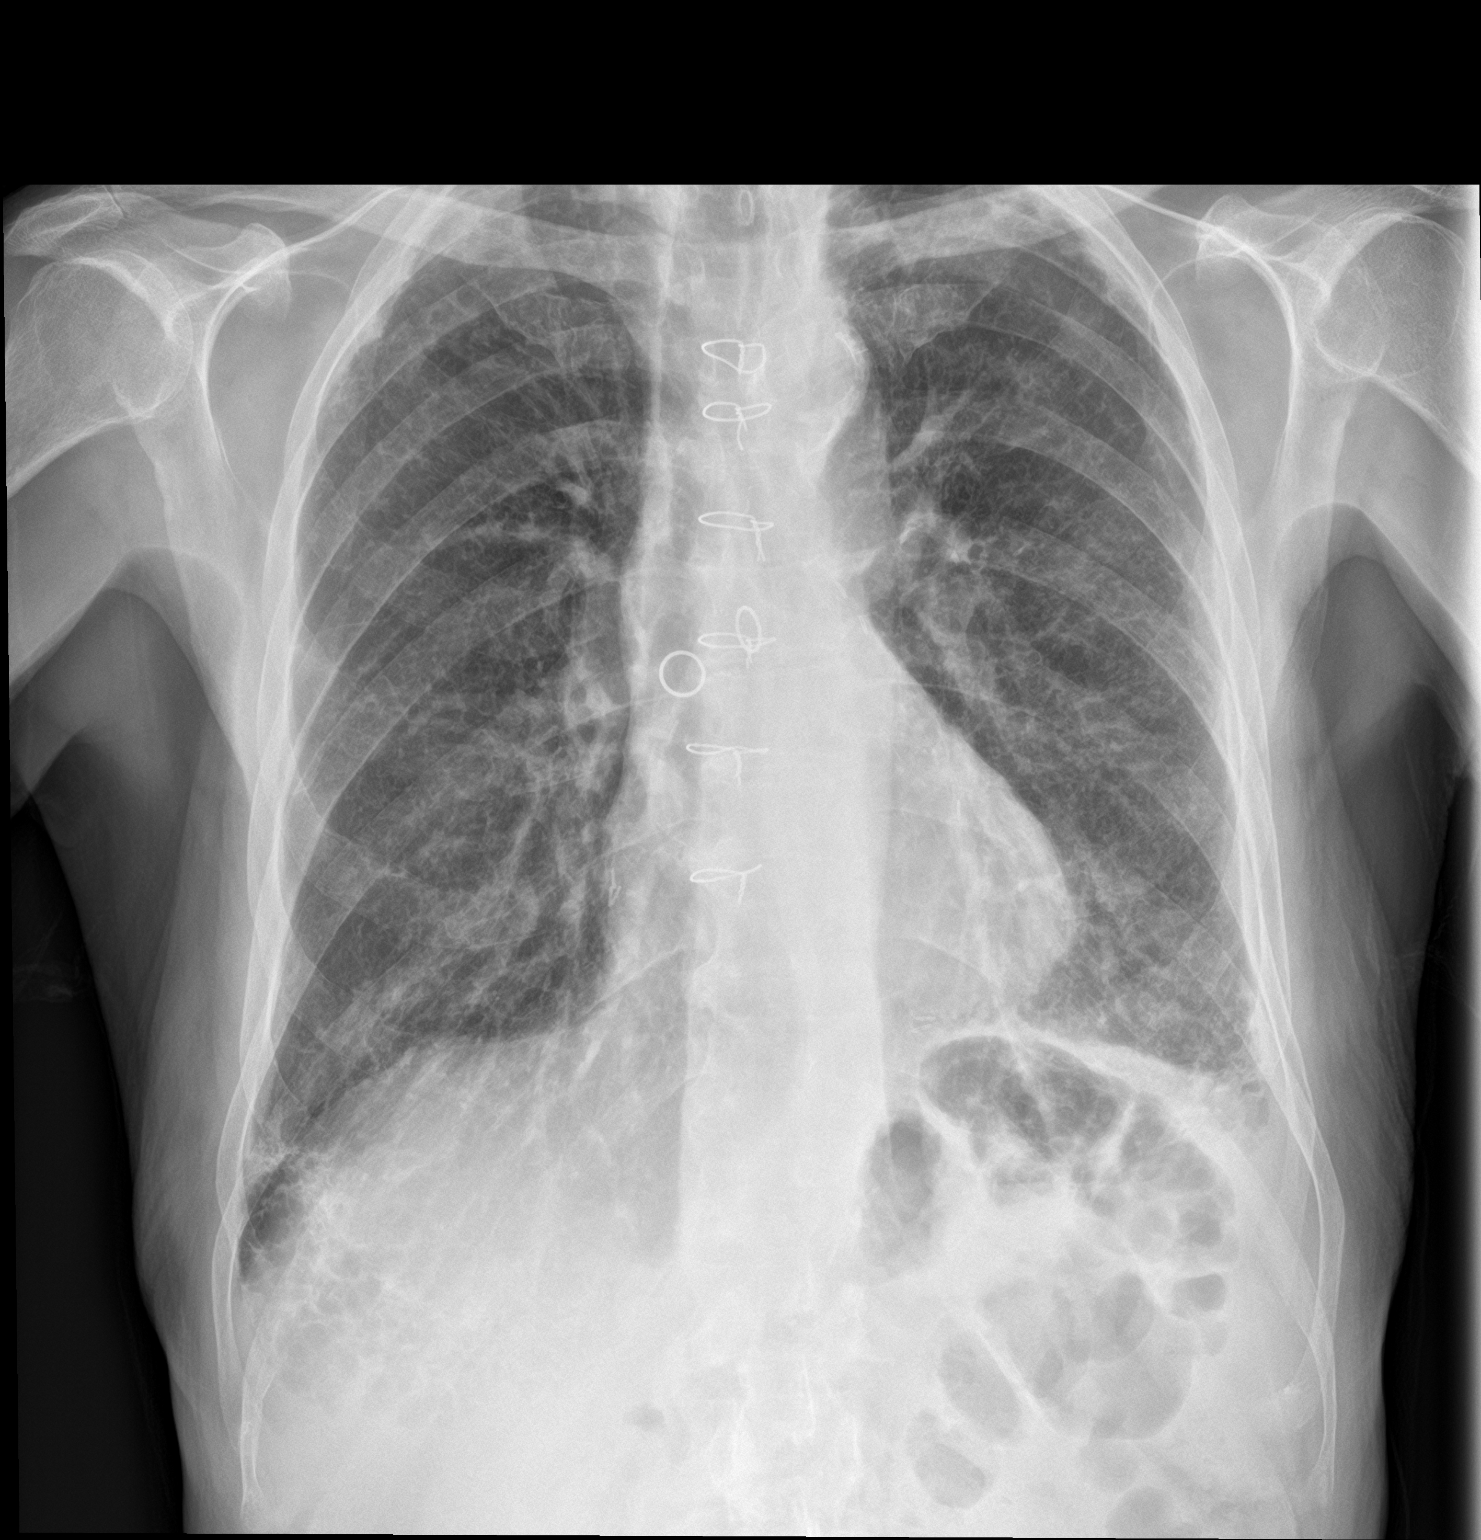

[chest lat]
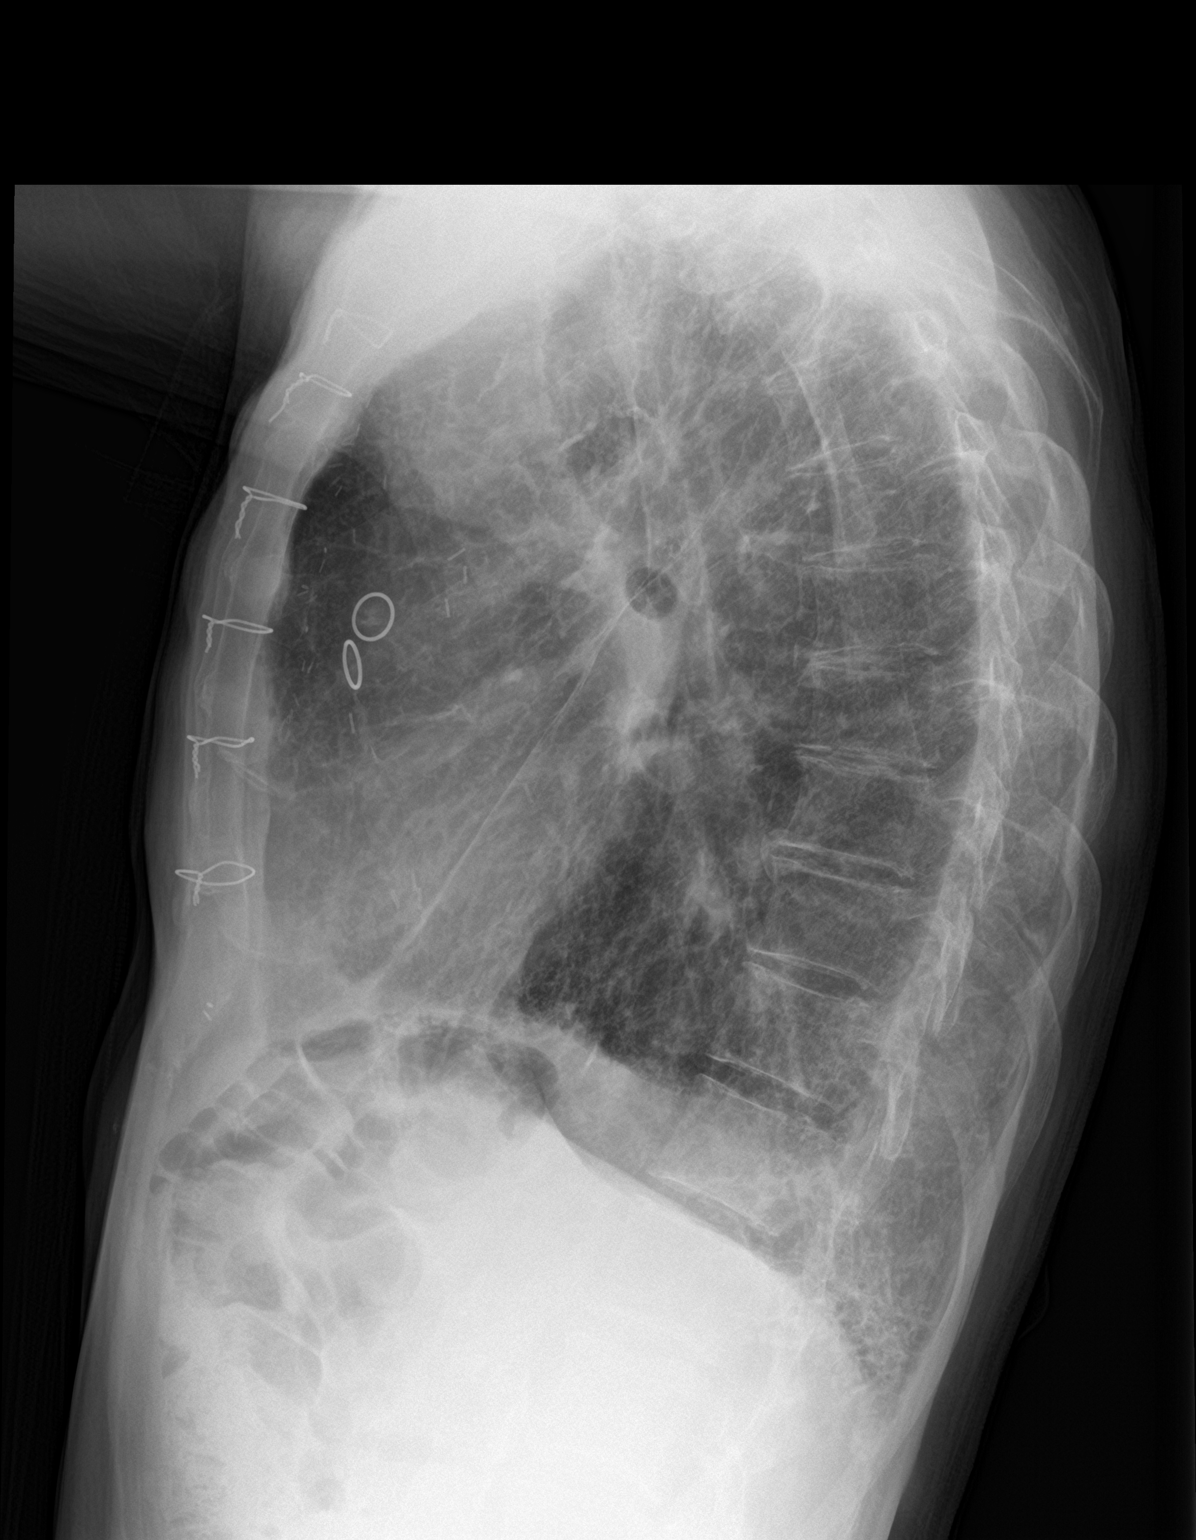

[2 of 2 positions shown; findings below may reference images not displayed]

FINDINGS: Hyperinflation with tubular lucencies at the apices and right
greater than left lung base, suspect bronchiectasis. Diffuse chronic
bronchitic changes. No acute consolidation, pleural effusion, or
pneumothorax. Post sternotomy changes. Apical scarring. Stable
cardiomediastinal silhouette with aortic atherosclerosis.
IMPRESSION: 1. Hyperinflation with chronic interstitial opacities and biapical
pleuroparenchymal scarring. Suspected areas of bronchiectasis and or
fibrosis at the bases and apices. No acute superimposed airspace
disease.

## 2023-07-21 NOTE — Telephone Encounter (Signed)
Nurses-please order metabolic 7, urine ACR, lipid, liver-hyperlipidemia, high risk medication, hypertension thank you

## 2023-07-21 NOTE — Telephone Encounter (Signed)
Patient notified that labs have been ordered.

## 2023-07-22 DIAGNOSIS — E7849 Other hyperlipidemia: Secondary | ICD-10-CM | POA: Diagnosis not present

## 2023-07-22 DIAGNOSIS — I10 Essential (primary) hypertension: Secondary | ICD-10-CM | POA: Diagnosis not present

## 2023-07-22 DIAGNOSIS — Z79899 Other long term (current) drug therapy: Secondary | ICD-10-CM | POA: Diagnosis not present

## 2023-07-23 LAB — HEPATIC FUNCTION PANEL
ALT: 12 [IU]/L (ref 0–44)
AST: 21 [IU]/L (ref 0–40)
Albumin: 4.3 g/dL (ref 3.7–4.7)
Alkaline Phosphatase: 95 [IU]/L (ref 44–121)
Bilirubin Total: 0.5 mg/dL (ref 0.0–1.2)
Bilirubin, Direct: 0.19 mg/dL (ref 0.00–0.40)
Total Protein: 7.5 g/dL (ref 6.0–8.5)

## 2023-07-23 LAB — MICROALBUMIN / CREATININE URINE RATIO
Creatinine, Urine: 127.2 mg/dL
Microalb/Creat Ratio: 24 mg/g{creat} (ref 0–29)
Microalbumin, Urine: 30.5 ug/mL

## 2023-07-23 LAB — LIPID PANEL
Chol/HDL Ratio: 3.1 {ratio} (ref 0.0–5.0)
Cholesterol, Total: 111 mg/dL (ref 100–199)
HDL: 36 mg/dL — ABNORMAL LOW (ref >39)
LDL Chol Calc (NIH): 58 mg/dL (ref 0–99)
Triglycerides: 88 mg/dL (ref 0–149)
VLDL Cholesterol Cal: 17 mg/dL (ref 5–40)

## 2023-07-23 LAB — BASIC METABOLIC PANEL (7)
BUN/Creatinine Ratio: 16 (ref 10–24)
BUN: 13 mg/dL (ref 8–27)
CO2: 26 mmol/L (ref 20–29)
Chloride: 99 mmol/L (ref 96–106)
Creatinine, Ser: 0.79 mg/dL (ref 0.76–1.27)
Glucose: 89 mg/dL (ref 70–99)
Potassium: 4.3 mmol/L (ref 3.5–5.2)
Sodium: 139 mmol/L (ref 134–144)
eGFR: 88 mL/min/{1.73_m2} (ref >59)

## 2023-07-27 ENCOUNTER — Encounter: Payer: Self-pay | Admitting: Family Medicine

## 2023-07-27 ENCOUNTER — Ambulatory Visit (INDEPENDENT_AMBULATORY_CARE_PROVIDER_SITE_OTHER): Payer: Medicare HMO | Admitting: Family Medicine

## 2023-07-27 VITALS — BP 146/64 | HR 80 | Wt 117.0 lb

## 2023-07-27 DIAGNOSIS — R002 Palpitations: Secondary | ICD-10-CM | POA: Diagnosis not present

## 2023-07-27 DIAGNOSIS — Z23 Encounter for immunization: Secondary | ICD-10-CM | POA: Diagnosis not present

## 2023-07-27 DIAGNOSIS — L570 Actinic keratosis: Secondary | ICD-10-CM

## 2023-07-27 DIAGNOSIS — J8417 Interstitial lung disease with progressive fibrotic phenotype in diseases classified elsewhere: Secondary | ICD-10-CM

## 2023-07-28 ENCOUNTER — Other Ambulatory Visit: Payer: Self-pay

## 2023-07-28 DIAGNOSIS — L570 Actinic keratosis: Secondary | ICD-10-CM

## 2023-07-28 NOTE — Progress Notes (Signed)
Subjective:    Patient ID: Jason Spence, male    DOB: 05-20-1941, 82 y.o.   MRN: [id number]  Discussed the use of AI scribe software for clinical note transcription with the patient, who gave verbal consent to proceed.  History of Present Illness   The patient, with a history of pulmonary fibrosis and vision loss, reports a stable health status with no new complaints. He notes a progressive decline in vision, describing it as a "fog" in front of his eyes, with peripheral vision being more reliable than central. This has led to some difficulties in daily activities, such as preparing meals, but he denies any falls or injuries.  The patient also reports occasional episodes of rapid heart rate, lasting approximately 10 minutes, with no associated symptoms such as chest pain, shortness of breath, or dizziness. These episodes are infrequent, occurring perhaps once every few months.  In terms of respiratory symptoms, the patient notes occasional wheezing, particularly when walking, but denies any significant shortness of breath. He is due for a follow-up with a specialist for his pulmonary fibrosis in the coming months.  The patient also mentions a persistent lesion on his forehead, which was previously treated by a dermatologist with cryotherapy but has remained sore and unchanged. He denies any other new skin changes or lesions.  In terms of daily activities and lifestyle, the patient remains active, enjoying outdoor walks and maintaining a regular diet with three meals a day. He also consumes protein drinks for additional nutritional support. Despite his vision loss, he has adapted to his environment and continues to live independently.         Review of Systems     Objective:    Physical Exam   VITALS: BP- 140/62 CHEST: Lungs clear to auscultation. CARDIOVASCULAR: Heart sounds normal. SKIN: Lesion on head with stem-like appearance, sore upon palpation.           Assessment &  Plan:  Assessment and Plan    Visual Impairment Progressive worsening of vision, described as a foggy blur in the central field. Peripheral vision remains functional. No reported falls or injuries, but patient has to be cautious while moving around. -Continue current management and safety precautions at home.  Pulmonary Fibrosis Patient reports occasional wheezing, especially during physical activity. No significant shortness of breath or other respiratory distress. Follow-up with specialist at [facility] scheduled for December. -Continue current management and monitor for any changes in respiratory symptoms.  Possible Cardiac Arrhythmia Patient reports occasional episodes of rapid heart rate lasting approximately 10 minutes, with no associated symptoms such as chest pain, shortness of breath, or dizziness. Episodes are infrequent, occurring approximately once every few months. -Perform baseline EKG in office today. -Discuss case with cardiologist for further guidance on whether to pursue additional monitoring.  Skin Lesion on Forehead Possible actinic keratosis or early skin cancer. Previous attempt at freezing the lesion was not successful. -Refer to Dr. Teresita Madura for further evaluation and possible treatment.  General Health Maintenance Patient received flu shot today and is up to date on pneumococcal vaccine. Received RSV vaccine last year, which is effective for two years. Patient has not yet received COVID booster. -Discuss benefits of COVID booster and encourage patient to consider receiving it.  Nutrition Patient is making efforts to maintain adequate caloric intake, including consuming protein drinks and eating three meals a day. -Encourage continued efforts to maintain adequate nutrition.

## 2023-07-31 ENCOUNTER — Other Ambulatory Visit: Payer: Self-pay | Admitting: Family Medicine

## 2023-08-03 IMAGING — DX DG CHEST 2V
2 series · 2 of 2 positions shown · non-contrast
Comparison: Chest radiographs 10/13/2021, CT chest 10/15/2021

CLINICAL DATA: Three months of productive cough. Recent history of
pneumonia.

EXAM:
CHEST - 2 VIEW

[chest pa]
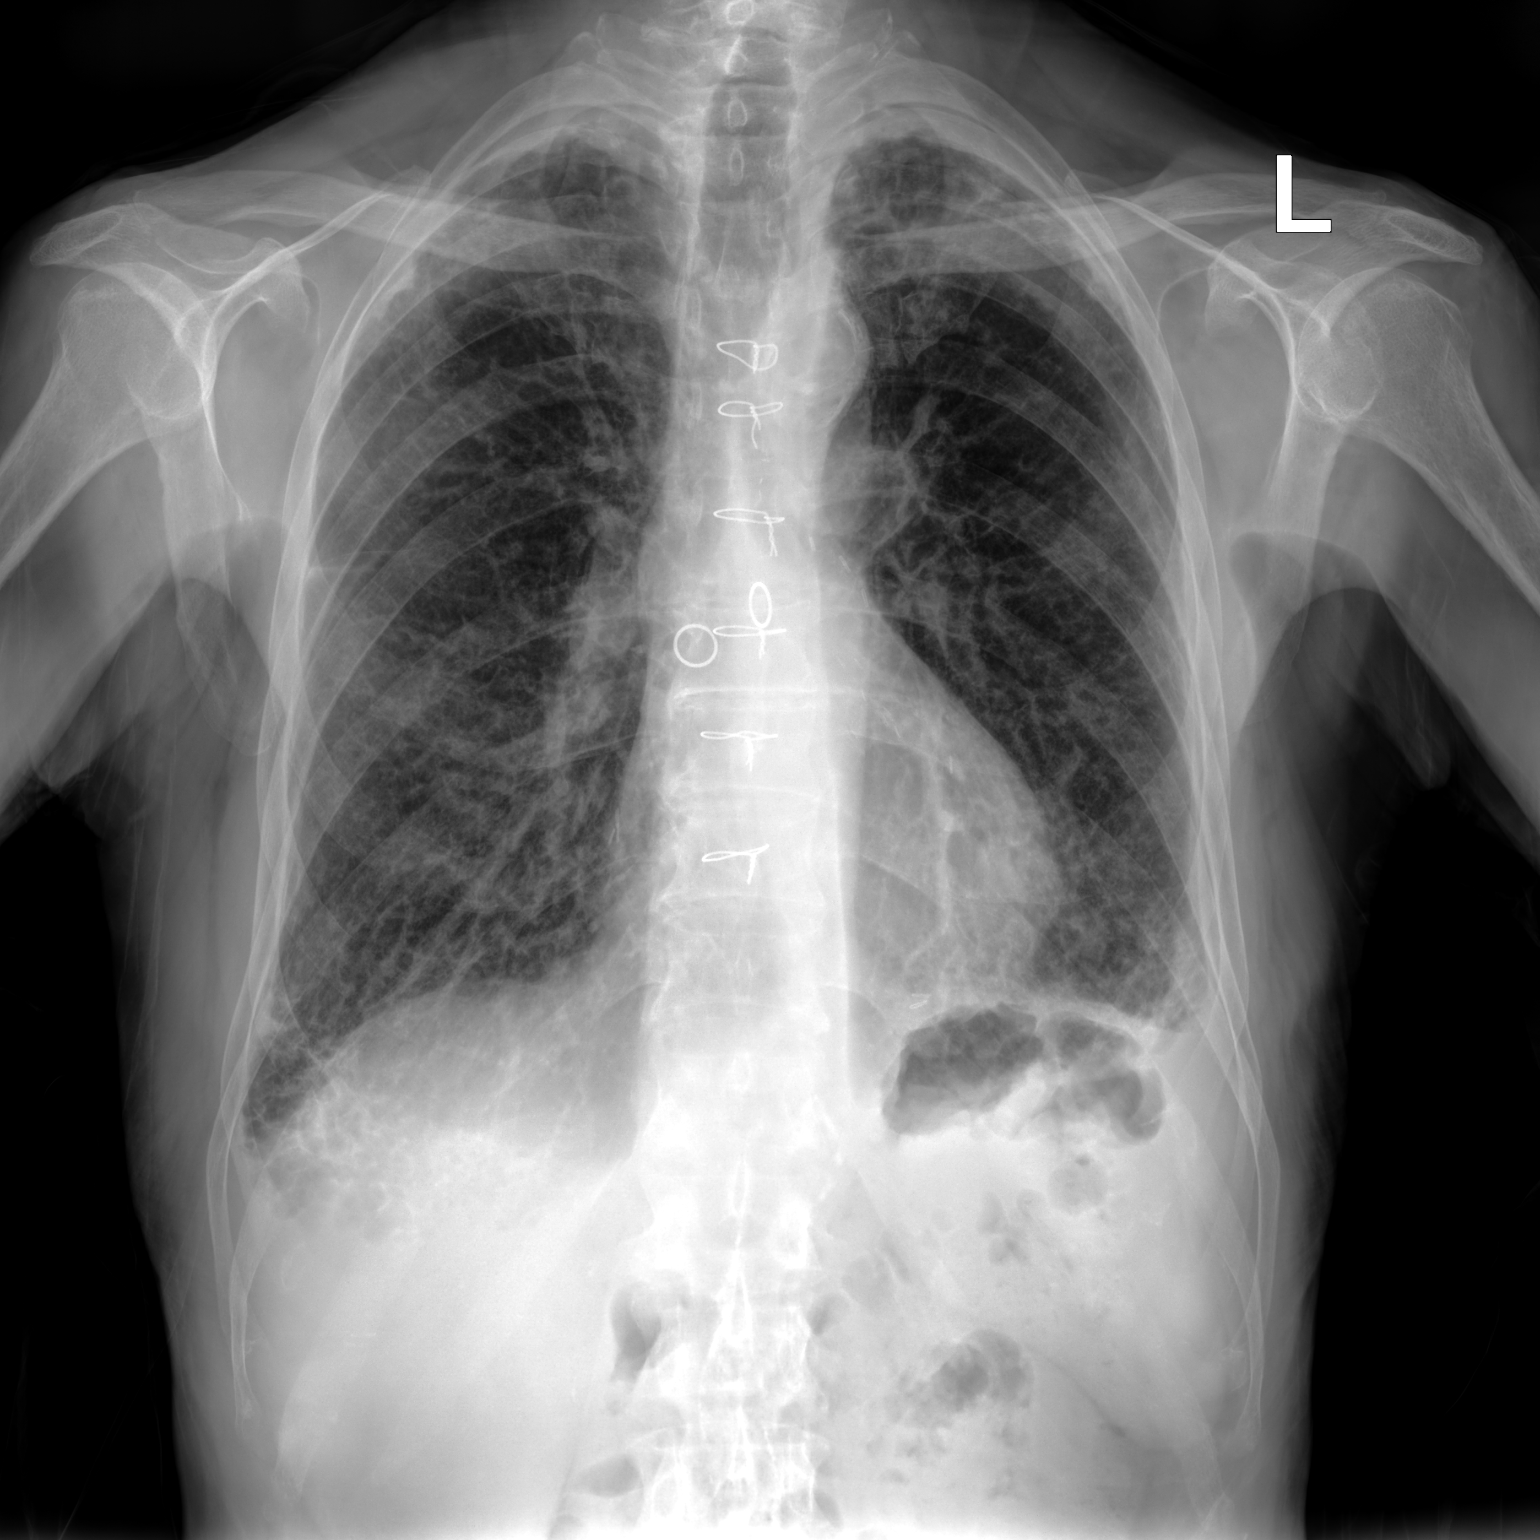

[chest lat]
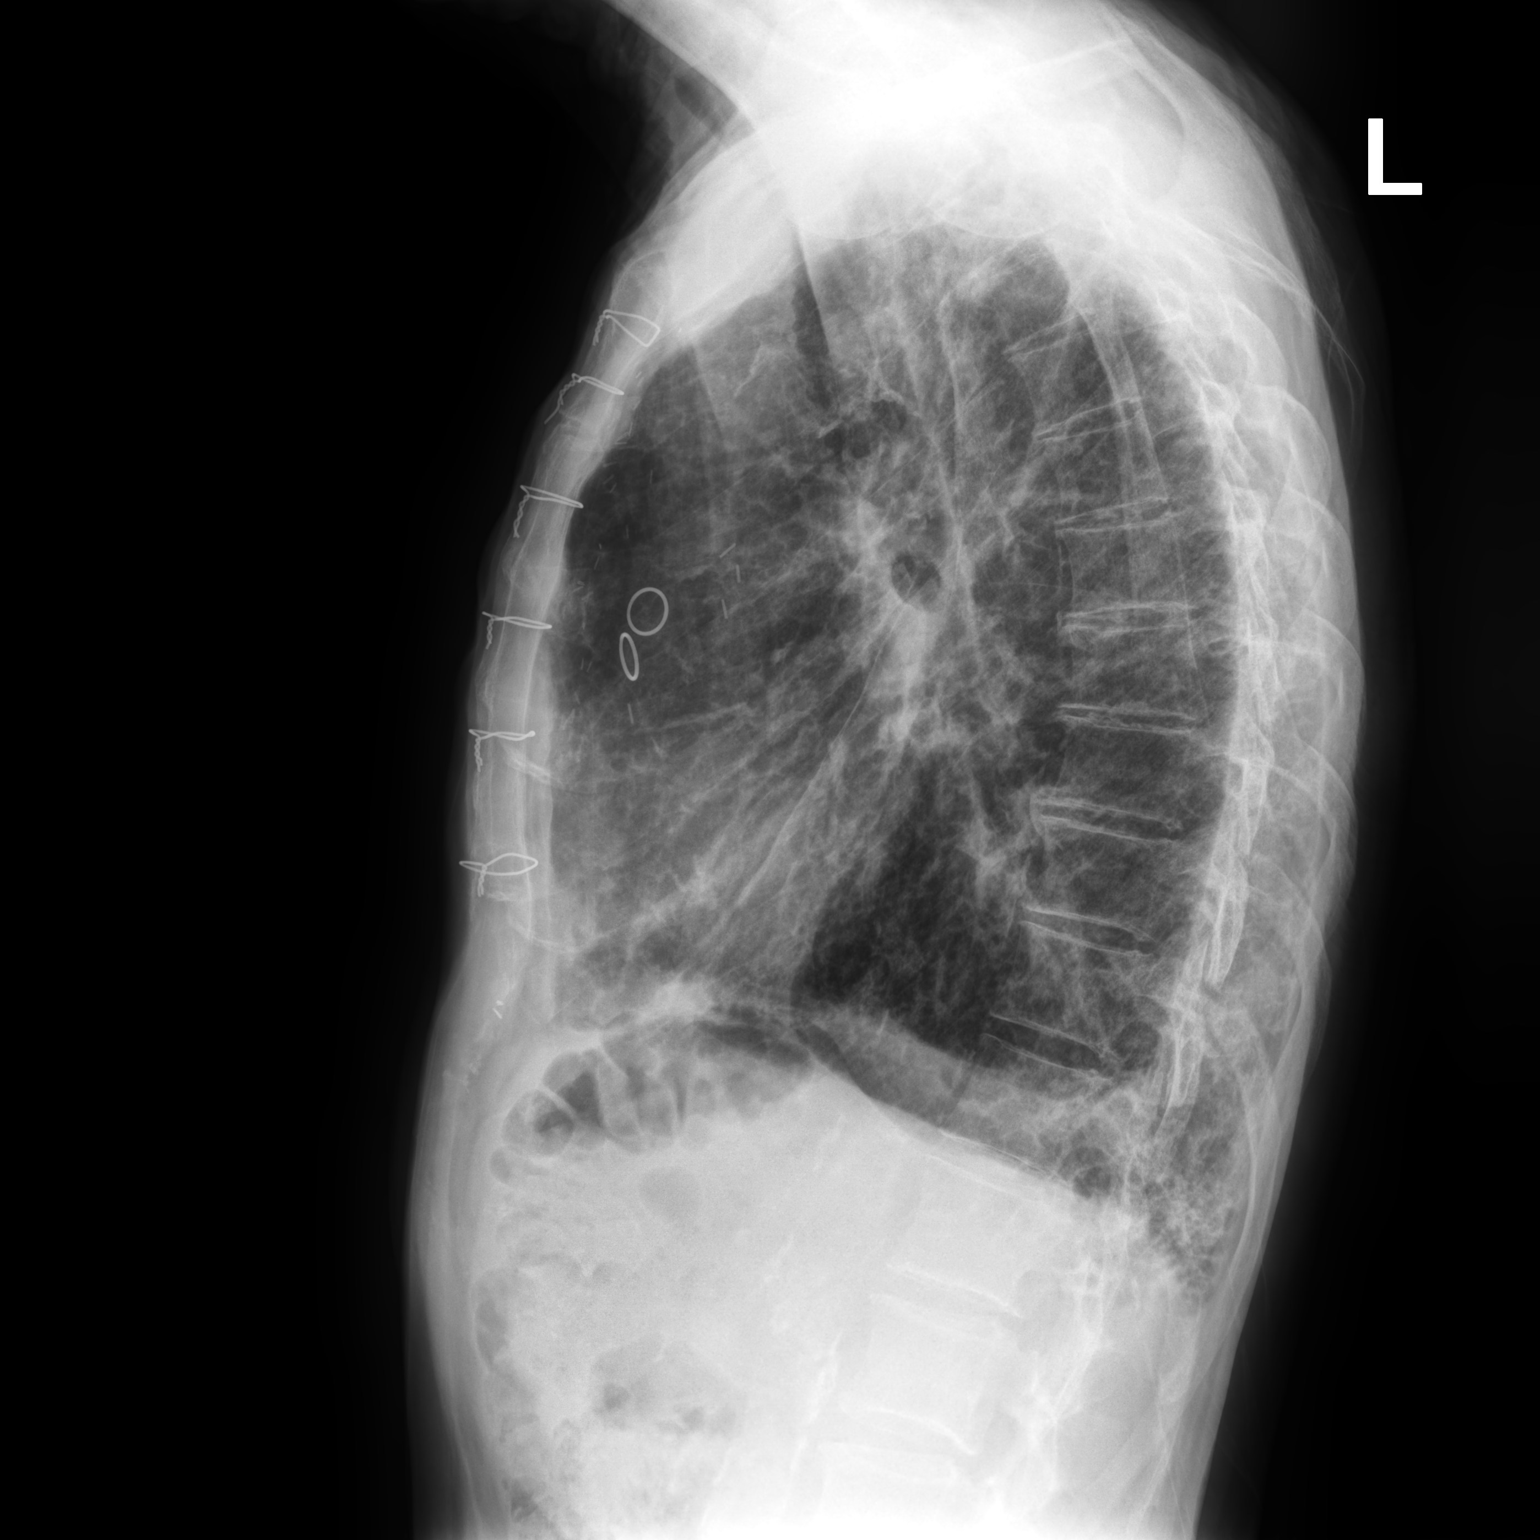

[2 of 2 positions shown; findings below may reference images not displayed]

FINDINGS: Findings cardiac silhouette and mediastinal contours are unchanged
and within normal limits. Status post median sternotomy and CABG.
Unchanged contours of mildly prominent bilateral main pulmonary
arteries.

There is again moderate bilateral interstitial thickening, likely
from chronic interstitial lung disease. There is again moderate
flattening of the diaphragms. There is again moderate biapical
pleural scarring.

There is blunting of left costophrenic angle likely related to the
small pleural effusion seen on 10/15/2021 chest CT.

No acute skeletal abnormality.
IMPRESSION: :
IMPRESSION: 1. No significant change compared to 10/13/2021.
2. Moderate chronic interstitial lung disease.
3. Small left pleural effusion.

## 2023-08-24 DIAGNOSIS — X32XXXA Exposure to sunlight, initial encounter: Secondary | ICD-10-CM | POA: Diagnosis not present

## 2023-08-24 DIAGNOSIS — L570 Actinic keratosis: Secondary | ICD-10-CM | POA: Diagnosis not present

## 2023-08-24 DIAGNOSIS — D225 Melanocytic nevi of trunk: Secondary | ICD-10-CM | POA: Diagnosis not present

## 2023-08-29 DIAGNOSIS — H353232 Exudative age-related macular degeneration, bilateral, with inactive choroidal neovascularization: Secondary | ICD-10-CM | POA: Diagnosis not present

## 2023-08-29 DIAGNOSIS — H35033 Hypertensive retinopathy, bilateral: Secondary | ICD-10-CM | POA: Diagnosis not present

## 2023-08-29 DIAGNOSIS — H43813 Vitreous degeneration, bilateral: Secondary | ICD-10-CM | POA: Diagnosis not present

## 2023-09-02 ENCOUNTER — Ambulatory Visit: Payer: Medicare HMO | Attending: Internal Medicine | Admitting: Internal Medicine

## 2023-09-02 ENCOUNTER — Encounter: Payer: Self-pay | Admitting: Internal Medicine

## 2023-09-02 ENCOUNTER — Telehealth: Payer: Self-pay | Admitting: Internal Medicine

## 2023-09-02 ENCOUNTER — Ambulatory Visit: Payer: Medicare HMO | Attending: Internal Medicine

## 2023-09-02 ENCOUNTER — Other Ambulatory Visit: Payer: Self-pay | Admitting: Internal Medicine

## 2023-09-02 VITALS — BP 144/60 | HR 80 | Ht 67.0 in | Wt 119.0 lb

## 2023-09-02 DIAGNOSIS — R002 Palpitations: Secondary | ICD-10-CM

## 2023-09-02 DIAGNOSIS — Z951 Presence of aortocoronary bypass graft: Secondary | ICD-10-CM | POA: Diagnosis not present

## 2023-09-02 NOTE — Progress Notes (Addendum)
Cardiology Office Note  Date: 09/02/2023   ID: Jason Spence, DOB 1940-11-02, MRN [id number]  PCP:  Babs Sciara, MD  Cardiologist:  Marjo Bicker, MD Electrophysiologist:  None   History of Present Illness: Jason Spence is a 82 y.o. male known to have CAD s/p CABG in 1996, HTN, HLD, pulmonary fibrosis not on home O2 was referred to cardiology clinic for evaluation of palpitations.  Ongoing palpitations, once a week, last for 1 to 2 minutes.  EKG today showed NSR, frequent PACs.  He walks for 1 mile a day.  No angina, DOE, orthopnea, PND, exertional dizziness.  No syncope and leg swelling.  No bleeding complications except for intermittent profuse bleeding from the nose, followed up with ENT last year.   Past Medical History:  Diagnosis Date   Anal stricture 10/18/2006   Arthritis    Hypercholesteremia    Hypertension    Myocardial infarct [facility])     Past Surgical History:  Procedure Laterality Date   BALLOON DILATION N/A 01/05/2013   Procedure: BALLOON DILATION;  Surgeon: Malissa Hippo, MD;  [facility];  Service: Endoscopy;  Laterality: N/A;   BIOPSY  02/27/2018   Procedure: BIOPSY;  Surgeon: Malissa Hippo, MD;  [facility];  Service: Endoscopy;;  esophagus   BIOPSY  08/05/2022   Procedure: BIOPSY;  Surgeon: Dolores Frame, MD;  [facility];  Service: Gastroenterology;;   COLONOSCOPY WITH ESOPHAGOGASTRODUODENOSCOPY (EGD) N/A 01/05/2013   Procedure: COLONOSCOPY WITH ESOPHAGOGASTRODUODENOSCOPY (EGD);  Surgeon: Malissa Hippo, MD;  [facility];  Service: Endoscopy;  Laterality: N/A;  100   COLONOSCOPY WITH PROPOFOL N/A 08/05/2022   Procedure: COLONOSCOPY WITH PROPOFOL;  Surgeon: Dolores Frame, MD;  [facility];  Service: Gastroenterology;  Laterality: N/A;  1030 ASA 3   CORONARY ARTERY BYPASS GRAFT     ESOPHAGEAL DILATION N/A 02/27/2018   Procedure: ESOPHAGEAL DILATION;   Surgeon: Malissa Hippo, MD;  [facility];  Service: Endoscopy;  Laterality: N/A;   ESOPHAGOGASTRODUODENOSCOPY N/A 02/27/2018   Procedure: ESOPHAGOGASTRODUODENOSCOPY (EGD);  Surgeon: Malissa Hippo, MD;  [facility];  Service: Endoscopy;  Laterality: N/A;  12:25   ESOPHAGOGASTRODUODENOSCOPY (EGD) WITH PROPOFOL N/A 08/05/2022   Procedure: ESOPHAGOGASTRODUODENOSCOPY (EGD) WITH PROPOFOL;  Surgeon: Dolores Frame, MD;  [facility];  Service: Gastroenterology;  Laterality: N/A;   GIVENS CAPSULE STUDY N/A 08/24/2022   Procedure: GIVENS CAPSULE STUDY;  Surgeon: Dolores Frame, MD;  [facility];  Service: Gastroenterology;  Laterality: N/A;  730 am   HERNIA REPAIR     POLYPECTOMY  08/05/2022   Procedure: POLYPECTOMY;  Surgeon: Marguerita Merles, Reuel Boom, MD;  [facility];  Service: Gastroenterology;;    Current Outpatient Medications  Medication Sig Dispense Refill   albuterol (VENTOLIN HFA) 108 (90 Base) MCG/ACT inhaler Inhale 2 puffs into the lungs every 4 (four) hours as needed for wheezing. 1 each 2   Ascorbic Acid (VITAMIN C) 1000 MG tablet Take 1,000 mg by mouth daily.     aspirin 81 MG tablet Take 1 tablet (81 mg total) by mouth daily. 30 tablet    budesonide-formoterol (BREYNA) 160-4.5 MCG/ACT inhaler INHALE 2 PUFFS BY MOUTH TWICE DAILY -  RINSE  MOUTH  WITH  WATER  AFTER  EACH  USE 11 g 5   cetirizine (ZYRTEC ALLERGY) 10 MG tablet Take 1 tablet (10 mg total) by mouth daily.  30 tablet 2   Cholecalciferol (VITAMIN D3 PO) Take 1 tablet by mouth daily.     ferrous sulfate 325 (65 FE) MG tablet Take 1 tablet (325 mg total) by mouth daily with breakfast. 90 tablet 3   fish oil-omega-3 fatty acids 1000 MG capsule Take 1 g by mouth daily.      ketoconazole (NIZORAL) 2 % cream APPLY CREAM TOPICALLY TO AFFECTED AREA TWICE DAILY AS NEEDED 60 g 0   meclizine (ANTIVERT) 25 MG tablet Take 1 tablet (25 mg total) by mouth 3  (three) times daily as needed for dizziness. 21 tablet 0   Multiple Vitamins-Minerals (PRESERVISION AREDS 2 PO) Take 1 capsule by mouth 2 (two) times daily.     pantoprazole (PROTONIX) 40 MG tablet TAKE 1 TABLET BY MOUTH ONCE DAILY. TAKE 30 MINUTES BEFORE BREAKFAST 90 tablet 1   pravastatin (PRAVACHOL) 40 MG tablet Take 1 tablet by mouth once daily 90 tablet 0   ramipril (ALTACE) 10 MG capsule Take 1 capsule by mouth once daily 90 capsule 0   triamcinolone cream (KENALOG) 0.1 % Apply 1 Application topically daily. (Patient taking differently: Apply 1 Application topically daily as needed (rash).) 80 g 3   No current facility-administered medications for this visit.   Allergies:  Patient has no known allergies.   Social History: The patient  reports that he quit smoking about 28 years ago. His smoking use included cigarettes. He has been exposed to tobacco smoke. He has never used smokeless tobacco. He reports that he does not currently use alcohol. He reports that he does not use drugs.   Family History: The patient's family history includes Hypertension in his sister.   ROS:  Please see the history of present illness. Otherwise, complete review of systems is positive for none  All other systems are reviewed and negative.   Physical Exam: VS:  BP (!) 144/60 (BP Location: Right Arm, Cuff Size: Normal)   Pulse 80   Ht 5\' 7"  (1.702 m)   Wt 119 lb (54 kg)   SpO2 97%   BMI 18.64 kg/m , BMI Body mass index is 18.64 kg/m.  Wt Readings from Last 3 Encounters:  09/02/23 119 lb (54 kg)  07/27/23 117 lb (53.1 kg)  05/26/23 120 lb 6.4 oz (54.6 kg)    General: Patient appears comfortable at rest. HEENT: Conjunctiva and lids normal, oropharynx clear with moist mucosa. Neck: Supple, no elevated JVP or carotid bruits, no thyromegaly. Lungs: Clear to auscultation, nonlabored breathing at rest. Cardiac: Regular rate and rhythm, no S3 or significant systolic murmur, no pericardial rub. Abdomen:  Soft, nontender, no hepatomegaly, bowel sounds present, no guarding or rebound. Extremities: No pitting edema, distal pulses 2+. Skin: Warm and dry. Musculoskeletal: No kyphosis. Neuropsychiatric: Alert and oriented x3, affect grossly appropriate.  Recent Labwork: 04/04/2023: Hemoglobin 13.3; Platelets 237 07/22/2023: ALT 12; AST 21; BUN 13; Creatinine, Ser 0.79; Potassium 4.3; Sodium 139     Component Value Date/Time   CHOL 111 07/22/2023 0847   TRIG 88 07/22/2023 0847   HDL 36 (L) 07/22/2023 0847   CHOLHDL 3.1 07/22/2023 0847   CHOLHDL 3.2 07/08/2014 0925   VLDL 31 07/08/2014 0925   LDLCALC 58 07/22/2023 0847     Assessment and Plan:   Palpitations CAD s/p CABG in 1996 HLD, at goal HTN, controlled Pulmonary fibrosis   -Ongoing palpitations, occurs once a week, lasting for 1-2 minutes. EKG today showed normal sinus rhythm, TWI in V3 and V4 and frequent PACs.  Obtain 2-week event monitor to rule out asymptomatic atrial arrhythmias (he probably might be at higher risk for atrial arrhythmias due to underlying pulmonary fibrosis). -No angina, DOE, asymptomatic. Walks for 1 mile a day. Continue Aspirin 81 mg once daily and pravastatin 40 mg nightly.  Goal LDL less than 70.  No bleeding complications except for intermittent profuse bleeding from the nose, follows up with ENT. ER precautions for chest pain provided. -Continue current antihypertensives, ramipril 10 mg once daily. -Follows up with pulmonary at [facility] for management of pulmonary fibrosis.  I spent a total duration of 45 minutes reviewing the prior medications, notes, EKG, discussion/counseling of his CAD, palpitations, HLD, HTN.  Ordered test and documented findings note.   Medication Adjustments/Labs and Tests Ordered: Current medicines are reviewed at length with the patient today.  Concerns regarding medicines are outlined above.    Disposition:  Follow up  1 year  Signed Kila Godina Verne Spurr, MD, 09/02/2023  10:12 AM    [facility] at [facility] [address] [place], [place], [place] [address]

## 2023-09-02 NOTE — Patient Instructions (Addendum)
Medication Instructions:  Your physician recommends that you continue on your current medications as directed. Please refer to the Current Medication list given to you today.  Labwork: none  Testing/Procedures: 14 Day ZIO XT heart monitor  Follow-Up: Your physician recommends that you schedule a follow-up appointment in: 1 year. You will receive a reminder call in about 10 months reminding you to schedule your appointment. If you don't receive this call, please contact our office.  Any Other Special Instructions Will Be Listed Below (If Applicable).  If you need a refill on your cardiac medications before your next appointment, please call your pharmacy.

## 2023-09-02 NOTE — Telephone Encounter (Signed)
Checking percert on the following   14 Day ZIO XT dx: palpitations

## 2023-09-09 ENCOUNTER — Other Ambulatory Visit: Payer: Self-pay | Admitting: Family Medicine

## 2023-09-24 DIAGNOSIS — R002 Palpitations: Secondary | ICD-10-CM | POA: Diagnosis not present

## 2023-09-27 DIAGNOSIS — M79674 Pain in right toe(s): Secondary | ICD-10-CM | POA: Diagnosis not present

## 2023-09-27 DIAGNOSIS — B351 Tinea unguium: Secondary | ICD-10-CM | POA: Diagnosis not present

## 2023-09-27 DIAGNOSIS — M79675 Pain in left toe(s): Secondary | ICD-10-CM | POA: Diagnosis not present

## 2023-09-30 ENCOUNTER — Other Ambulatory Visit: Payer: Self-pay | Admitting: Family Medicine

## 2023-10-20 ENCOUNTER — Telehealth: Payer: Self-pay

## 2023-10-20 MED ORDER — METOPROLOL TARTRATE 25 MG PO TABS: 25.0000 mg | ORAL_TABLET | Freq: Two times a day (BID) | ORAL | 3 refills | Status: DC

## 2023-10-20 NOTE — Telephone Encounter (Signed)
-----   Message from Vishnu P Mallipeddi sent at 09/30/2023 10:49 AM EST ----- 16.8% PAC burden, symptomatic and 8 runs of NSVT, longest lasting 6 beats.  Start metoprolol  tartrate 25 mg twice daily.  Schedule follow-up visit in 3 months or sooner if patient requests.

## 2023-10-20 NOTE — Telephone Encounter (Signed)
 Patient informed and verbalized understanding of plan.

## 2023-10-30 ENCOUNTER — Other Ambulatory Visit: Payer: Self-pay | Admitting: Family Medicine

## 2023-11-09 ENCOUNTER — Ambulatory Visit (INDEPENDENT_AMBULATORY_CARE_PROVIDER_SITE_OTHER): Payer: Medicare HMO | Admitting: Family Medicine

## 2023-11-09 ENCOUNTER — Encounter: Payer: Self-pay | Admitting: Family Medicine

## 2023-11-09 VITALS — BP 152/76 | HR 88 | Temp 99.8°F | Ht 67.0 in | Wt 124.0 lb

## 2023-11-09 DIAGNOSIS — J189 Pneumonia, unspecified organism: Secondary | ICD-10-CM | POA: Diagnosis not present

## 2023-11-09 MED ORDER — AMOXICILLIN-POT CLAVULANATE 875-125 MG PO TABS: 1.0000 | ORAL_TABLET | Freq: Two times a day (BID) | ORAL | 0 refills | Status: DC

## 2023-11-09 MED ORDER — DOXYCYCLINE HYCLATE 100 MG PO TABS: 100.0000 mg | ORAL_TABLET | Freq: Two times a day (BID) | ORAL | 0 refills | Status: DC

## 2023-11-09 NOTE — Progress Notes (Signed)
   Subjective:    Patient ID: Jason Spence, male    DOB: 07-09-1941, 83 y.o.   MRN: [id number]  Discussed the use of AI scribe software for clinical note transcription with the patient, who gave verbal consent to proceed.  History of Present Illness   The patient, with a history of interstitial lung disease, presented with a week-long history of cough and congestion, primarily located in the upper chest. The cough was productive, with the patient noting increased expectoration in the mornings. He also reported experiencing headaches, but denied any facial pain or fever. The patient described episodes of shivering, which he attributed to the cold weather rather than his current symptoms. He also noted a slight shortness of breath, which was exacerbated by deep inhalation and led to coughing.  The patient has been using an Albuterol inhaler, but found it difficult to determine its effectiveness. He also recently started on Metoprolol for premature atrial contractions identified on a heart monitor test. The patient did not report any noticeable changes in his heart rhythm since starting the medication.  In addition to these symptoms, the patient mentioned a recent dental procedure involving the extraction of a wisdom tooth, which subsequently developed a dry socket. However, he did not believe this event was related to his current respiratory symptoms.         Review of Systems     Objective:    Physical Exam   VITALS: BP- 134/60 CHEST: Lung auscultation reveals congestion.           Assessment & Plan:  Assessment and Plan    Respiratory Infection Symptoms of cough and congestion for 7-8 days. Underlying interstitial lung disease makes patient more prone to infections. Auscultation reveals congestion. -Start antibiotics. -Continue Albuterol as needed, every 3-4 hours. -Advise to stay indoors due to cold weather and potential exacerbation of symptoms. -Return for follow-up in one  week or sooner if symptoms worsen or high fevers develop.  Premature Atrial Contractions Noted on recent heart monitor test. Patient started on Metoprolol twice daily. -Continue Metoprolol. -No further action required at this time.  General Health Maintenance -Advise to wear a scarf over face when outside in cold weather to warm air before breathing in.     We will go ahead with antibiotics to cover for the possibility of a early pneumonia.  He does have crackles on the left base but he also has interstitial fibrosis.  I do not recommend a chest x-ray currently patient not toxic O2 saturation at a reasonable range We will go ahead with duly antibiotics to recheck him in less than 1 week and then follow-up sooner if any problems  Warning signs regarding when to go to the ER was discussed

## 2023-11-09 NOTE — Patient Instructions (Signed)
 Jason Spence

## 2023-11-14 ENCOUNTER — Other Ambulatory Visit: Payer: Self-pay

## 2023-11-14 ENCOUNTER — Encounter: Payer: Self-pay | Admitting: Physician Assistant

## 2023-11-14 ENCOUNTER — Emergency Department (HOSPITAL_COMMUNITY): Payer: Medicare HMO

## 2023-11-14 ENCOUNTER — Ambulatory Visit (INDEPENDENT_AMBULATORY_CARE_PROVIDER_SITE_OTHER): Payer: Medicare HMO | Admitting: Physician Assistant

## 2023-11-14 ENCOUNTER — Inpatient Hospital Stay (HOSPITAL_COMMUNITY)
Admission: EM | Admit: 2023-11-14 | Discharge: 2023-11-17 | DRG: 195 | Disposition: A | Discharge status: Home / Self Care | Payer: Medicare HMO | Attending: Family Medicine | Admitting: Family Medicine

## 2023-11-14 ENCOUNTER — Encounter (HOSPITAL_COMMUNITY): Payer: Self-pay

## 2023-11-14 ENCOUNTER — Telehealth: Payer: Self-pay | Admitting: Family Medicine

## 2023-11-14 ENCOUNTER — Ambulatory Visit (HOSPITAL_COMMUNITY)
Admission: RE | Admit: 2023-11-14 | Discharge: 2023-11-14 | Disposition: A | Discharge status: Home / Self Care | Payer: Medicare HMO | Source: Ambulatory Visit | Attending: Family Medicine | Admitting: Family Medicine

## 2023-11-14 VITALS — BP 133/84 | Temp 97.6°F | Ht 67.0 in | Wt 120.2 lb

## 2023-11-14 DIAGNOSIS — R079 Chest pain, unspecified: Secondary | ICD-10-CM | POA: Diagnosis not present

## 2023-11-14 DIAGNOSIS — Z951 Presence of aortocoronary bypass graft: Secondary | ICD-10-CM | POA: Diagnosis not present

## 2023-11-14 DIAGNOSIS — J181 Lobar pneumonia, unspecified organism: Principal | ICD-10-CM | POA: Insufficient documentation

## 2023-11-14 DIAGNOSIS — I252 Old myocardial infarction: Secondary | ICD-10-CM | POA: Diagnosis not present

## 2023-11-14 DIAGNOSIS — I10 Essential (primary) hypertension: Secondary | ICD-10-CM | POA: Diagnosis present

## 2023-11-14 DIAGNOSIS — Z1152 Encounter for screening for COVID-19: Secondary | ICD-10-CM

## 2023-11-14 DIAGNOSIS — K219 Gastro-esophageal reflux disease without esophagitis: Secondary | ICD-10-CM | POA: Diagnosis not present

## 2023-11-14 DIAGNOSIS — Z87891 Personal history of nicotine dependence: Secondary | ICD-10-CM | POA: Diagnosis not present

## 2023-11-14 DIAGNOSIS — Z8249 Family history of ischemic heart disease and other diseases of the circulatory system: Secondary | ICD-10-CM

## 2023-11-14 DIAGNOSIS — J90 Pleural effusion, not elsewhere classified: Secondary | ICD-10-CM | POA: Diagnosis not present

## 2023-11-14 DIAGNOSIS — J189 Pneumonia, unspecified organism: Secondary | ICD-10-CM | POA: Diagnosis not present

## 2023-11-14 DIAGNOSIS — Z79899 Other long term (current) drug therapy: Secondary | ICD-10-CM

## 2023-11-14 DIAGNOSIS — J8417 Interstitial lung disease with progressive fibrotic phenotype in diseases classified elsewhere: Secondary | ICD-10-CM | POA: Diagnosis not present

## 2023-11-14 DIAGNOSIS — R591 Generalized enlarged lymph nodes: Secondary | ICD-10-CM | POA: Diagnosis not present

## 2023-11-14 DIAGNOSIS — R0602 Shortness of breath: Secondary | ICD-10-CM | POA: Diagnosis not present

## 2023-11-14 DIAGNOSIS — Z7982 Long term (current) use of aspirin: Secondary | ICD-10-CM

## 2023-11-14 DIAGNOSIS — I2510 Atherosclerotic heart disease of native coronary artery without angina pectoris: Secondary | ICD-10-CM | POA: Diagnosis present

## 2023-11-14 DIAGNOSIS — J8410 Pulmonary fibrosis, unspecified: Secondary | ICD-10-CM | POA: Diagnosis present

## 2023-11-14 DIAGNOSIS — E782 Mixed hyperlipidemia: Secondary | ICD-10-CM | POA: Diagnosis present

## 2023-11-14 DIAGNOSIS — Z789 Other specified health status: Secondary | ICD-10-CM

## 2023-11-14 DIAGNOSIS — R918 Other nonspecific abnormal finding of lung field: Secondary | ICD-10-CM | POA: Diagnosis not present

## 2023-11-14 LAB — COMPREHENSIVE METABOLIC PANEL
ALT: 44 U/L (ref 0–44)
AST: 48 U/L — ABNORMAL HIGH (ref 15–41)
Albumin: 3.8 g/dL (ref 3.5–5.0)
Alkaline Phosphatase: 83 U/L (ref 38–126)
Anion gap: 13 (ref 5–15)
BUN: 17 mg/dL (ref 8–23)
CO2: 24 mmol/L (ref 22–32)
Calcium: 9.5 mg/dL (ref 8.9–10.3)
Chloride: 99 mmol/L (ref 98–111)
Creatinine, Ser: 0.69 mg/dL (ref 0.61–1.24)
GFR, Estimated: >60 mL/min (ref >60)
Glucose, Bld: 106 mg/dL — ABNORMAL HIGH (ref 70–99)
Potassium: 3.9 mmol/L (ref 3.5–5.1)
Sodium: 136 mmol/L (ref 135–145)
Total Bilirubin: 1 mg/dL (ref 0.0–1.2)
Total Protein: 8.9 g/dL — ABNORMAL HIGH (ref 6.5–8.1)

## 2023-11-14 LAB — CBC WITH DIFFERENTIAL/PLATELET
Abs Immature Granulocytes: 0.05 10*3/uL (ref 0.00–0.07)
Basophils Absolute: 0.1 10*3/uL (ref 0.0–0.1)
Basophils Relative: 1 %
Eosinophils Absolute: 0.2 10*3/uL (ref 0.0–0.5)
Eosinophils Relative: 1 %
HCT: 38 % — ABNORMAL LOW (ref 39.0–52.0)
Hemoglobin: 12.3 g/dL — ABNORMAL LOW (ref 13.0–17.0)
Immature Granulocytes: 0 %
Lymphocytes Relative: 12 %
Lymphs Abs: 1.8 10*3/uL (ref 0.7–4.0)
MCH: 29.5 pg (ref 26.0–34.0)
MCHC: 32.4 g/dL (ref 30.0–36.0)
MCV: 91.1 fL (ref 80.0–100.0)
Monocytes Absolute: 1.3 10*3/uL — ABNORMAL HIGH (ref 0.1–1.0)
Monocytes Relative: 8 %
Neutro Abs: 12 10*3/uL — ABNORMAL HIGH (ref 1.7–7.7)
Neutrophils Relative %: 78 %
Platelets: 364 10*3/uL (ref 150–400)
RBC: 4.17 MIL/uL — ABNORMAL LOW (ref 4.22–5.81)
RDW: 13.1 % (ref 11.5–15.5)
WBC: 15.4 10*3/uL — ABNORMAL HIGH (ref 4.0–10.5)
nRBC: 0 % (ref 0.0–0.2)

## 2023-11-14 LAB — PROTIME-INR
INR: 1.3 — ABNORMAL HIGH (ref 0.8–1.2)
Prothrombin Time: 15.9 s — ABNORMAL HIGH (ref 11.4–15.2)

## 2023-11-14 LAB — RESP PANEL BY RT-PCR (RSV, FLU A&B, COVID)  RVPGX2
Influenza A by PCR: NEGATIVE
Influenza B by PCR: NEGATIVE
Resp Syncytial Virus by PCR: NEGATIVE
SARS Coronavirus 2 by RT PCR: NEGATIVE

## 2023-11-14 LAB — LACTIC ACID, PLASMA: Lactic Acid, Venous: 1.2 mmol/L (ref 0.5–1.9)

## 2023-11-14 LAB — BRAIN NATRIURETIC PEPTIDE: B Natriuretic Peptide: 383 pg/mL — ABNORMAL HIGH (ref 0.0–100.0)

## 2023-11-14 LAB — MAGNESIUM: Magnesium: 2.1 mg/dL (ref 1.7–2.4)

## 2023-11-14 MED ORDER — SODIUM CHLORIDE 0.9 % IV SOLN
1.0000 g | Freq: Once | INTRAVENOUS | Status: AC
  Administered 2023-11-14: 1 g via INTRAVENOUS
  Filled 2023-11-14: qty 10

## 2023-11-14 MED ORDER — METHYLPREDNISOLONE SODIUM SUCC 125 MG IJ SOLR
125.0000 mg | Freq: Once | INTRAMUSCULAR | Status: AC
  Administered 2023-11-14: 125 mg via INTRAVENOUS
  Filled 2023-11-14: qty 2

## 2023-11-14 MED ORDER — SODIUM CHLORIDE 0.9 % IV SOLN
500.0000 mg | Freq: Once | INTRAVENOUS | Status: AC
  Administered 2023-11-14: 500 mg via INTRAVENOUS
  Filled 2023-11-14: qty 5

## 2023-11-14 MED ORDER — IPRATROPIUM-ALBUTEROL 0.5-2.5 (3) MG/3ML IN SOLN
3.0000 mL | Freq: Once | RESPIRATORY_TRACT | Status: AC
  Administered 2023-11-14: 3 mL via RESPIRATORY_TRACT
  Filled 2023-11-14: qty 3

## 2023-11-14 MED ORDER — PROMETHAZINE-DM 6.25-15 MG/5ML PO SYRP: 5.0000 mL | ORAL_SOLUTION | Freq: Four times a day (QID) | ORAL | 0 refills | Status: DC | PRN

## 2023-11-14 MED ORDER — LACTATED RINGERS IV BOLUS
500.0000 mL | Freq: Once | INTRAVENOUS | Status: AC
  Administered 2023-11-15: 500 mL via INTRAVENOUS

## 2023-11-14 MED ORDER — PREDNISONE 20 MG PO TABS: ORAL_TABLET | ORAL | 0 refills | Status: AC

## 2023-11-14 MED ORDER — IOHEXOL 350 MG/ML SOLN
75.0000 mL | Freq: Once | INTRAVENOUS | Status: AC | PRN
  Administered 2023-11-14: 75 mL via INTRAVENOUS

## 2023-11-14 NOTE — Telephone Encounter (Signed)
I spoke with the patient by phone regarding his chest x-ray results. I also spoke with his daughter-in-law  Patient has underlying pulmonary fibrosis Patient was seen on 11/09/2023 with respiratory illness felt to be early pneumonia was treated with Augmentin and doxycycline  Seen by the PA for recheck earlier today initially prednisone was prescribed patient has not taken any  Chest x-ray came back showing bilateral pneumonia with some pleural effusions  When talking with the patient on the phone it was noted that he was having frequent coughing with short breaths in between his sentences  Given his advanced age underlying illness and progressive bilateral pneumonia patient needs to be evaluated in the ER for probable admission  ER doctor was spoken with Triage spoken with Family will be taking him to the ER for further evaluation

## 2023-11-14 NOTE — ED Triage Notes (Addendum)
Pt sent via PCP for pneumonia. Pt has been on 2 antibiotic since last week and has gotten worse. Got repeat scan today and pneumonia is worse.

## 2023-11-14 NOTE — ED Provider Notes (Addendum)
[place] EMERGENCY DEPARTMENT AT [facility] Provider Note   CSN: [id number] Arrival date & time: 11/14/23  2016     History  No chief complaint on file.   Jason Spence is a 83 y.o. male.  HPI Patient presents for cough and shortness of breath.  Medical history includes HTN, CAD, pulmonary fibrosis, GERD, arthritis, anemia.  He is currently not on any chronic treatment for his pulmonary fibrosis.  He was seen by his PCP 5 days ago for productive cough.  There was concern for developing pneumonia.  He was started on Augmentin and doxycycline.  He returned to PCPs office today with worsening symptoms.  His chest x-ray showed concern of multifocal pneumonia.  He was sent to the ED for further evaluation.  Patient endorses cough adductive of white sputum, shortness of breath, and fatigue.    Home Medications Prior to Admission medications   Medication Sig Start Date End Date Taking? Authorizing Provider  albuterol (VENTOLIN HFA) 108 (90 Base) MCG/ACT inhaler Inhale 2 puffs into the lungs every 4 (four) hours as needed for wheezing. 08/13/21   Babs Sciara, MD  amoxicillin-clavulanate (AUGMENTIN) 875-125 MG tablet Take 1 tablet by mouth 2 (two) times daily. 11/09/23   Babs Sciara, MD  Ascorbic Acid (VITAMIN C) 1000 MG tablet Take 1,000 mg by mouth daily.    [provider]  aspirin 81 MG tablet Take 1 tablet (81 mg total) by mouth daily. 02/28/18   Rehman, Joline Maxcy, MD  budesonide-formoterol (BREYNA) 160-4.5 MCG/ACT inhaler INHALE 2 PUFFS BY MOUTH TWICE DAILY -  RINSE  MOUTH  WITH  WATER  AFTER  EACH  USE 01/04/23   Babs Sciara, MD  cetirizine (ZYRTEC ALLERGY) 10 MG tablet Take 1 tablet (10 mg total) by mouth daily. 10/26/21   Particia Nearing, PA-C  Cholecalciferol (VITAMIN D3 PO) Take 1 tablet by mouth daily.    [provider]  doxycycline (VIBRA-TABS) 100 MG tablet Take 1 tablet (100 mg total) by mouth 2 (two) times daily. 11/09/23   Babs Sciara, MD  ferrous sulfate 325 (65 FE) MG tablet Take 1 tablet (325 mg total) by mouth daily with breakfast. 05/26/23   Carlan, Chelsea L, NP  fish oil-omega-3 fatty acids 1000 MG capsule Take 1 g by mouth daily.     [provider]  ketoconazole (NIZORAL) 2 % cream APPLY CREAM TOPICALLY TO AFFECTED AREA TWICE DAILY AS NEEDED 08/01/23   Babs Sciara, MD  meclizine (ANTIVERT) 25 MG tablet Take 1 tablet (25 mg total) by mouth 3 (three) times daily as needed for dizziness. 09/19/21   Tegeler, Canary Brim, MD  metoprolol tartrate (LOPRESSOR) 25 MG tablet Take 1 tablet (25 mg total) by mouth 2 (two) times daily. 10/20/23   Mallipeddi, Vishnu P, MD  Multiple Vitamins-Minerals (PRESERVISION AREDS 2 PO) Take 1 capsule by mouth 2 (two) times daily.    [provider]  pantoprazole (PROTONIX) 40 MG tablet TAKE 1 TABLET BY MOUTH ONCE DAILY 30 MINUTES BEFORE BREAKFAST 09/09/23   Babs Sciara, MD  pravastatin (PRAVACHOL) 40 MG tablet Take 1 tablet by mouth once daily 09/30/23   [name], Jonna Coup, MD  predniSONE (DELTASONE) 20 MG tablet Take 2 tablets (40 mg total) by mouth daily with breakfast for 2 days, THEN 1 tablet (20 mg total) daily with breakfast for 2 days, THEN 0.5 tablets (10 mg total) daily with breakfast for 2 days. 11/14/23 11/20/23  Grooms, [place],  PA-C  promethazine-dextromethorphan (PROMETHAZINE-DM) 6.25-15 MG/5ML syrup Take 5 mLs by mouth 4 (four) times daily as needed for cough. 11/14/23   Grooms, Courtney, PA-C  ramipril (ALTACE) 10 MG capsule Take 1 capsule by mouth once daily 10/31/23   Babs Sciara, MD  triamcinolone cream (KENALOG) 0.1 % Apply 1 Application topically daily. Patient taking differently: Apply 1 Application topically daily as needed (rash). 06/15/22   Babs Sciara, MD      Allergies    Patient has no known allergies.    Review of Systems   Review of Systems  Constitutional:  Positive for fatigue.  HENT:  Positive for congestion.   Respiratory:   Positive for cough, chest tightness and shortness of breath.   All other systems reviewed and are negative.   Physical Exam Updated Vital Signs BP (!) 145/51   Pulse 81   Temp 99.6 F (37.6 C)   Resp 15   Wt 54.4 kg   SpO2 95%   BMI 18.79 kg/m  Physical Exam Vitals and nursing note reviewed.  Constitutional:      General: He is not in acute distress.    Appearance: Normal appearance. He is well-developed and underweight. He is not ill-appearing, toxic-appearing or diaphoretic.  HENT:     Head: Normocephalic and atraumatic.     Right Ear: External ear normal.     Left Ear: External ear normal.     Nose: Nose normal.     Mouth/Throat:     Mouth: Mucous membranes are moist.  Eyes:     Extraocular Movements: Extraocular movements intact.     Conjunctiva/sclera: Conjunctivae normal.  Cardiovascular:     Rate and Rhythm: Normal rate and regular rhythm.     Heart sounds: No murmur heard. Pulmonary:     Effort: Pulmonary effort is normal. No tachypnea, accessory muscle usage or respiratory distress.     Breath sounds: Decreased breath sounds and rales present.  Abdominal:     General: There is no distension.     Palpations: Abdomen is soft.     Tenderness: There is no abdominal tenderness.  Musculoskeletal:        General: No swelling. Normal range of motion.     Cervical back: Normal range of motion and neck supple.  Skin:    General: Skin is warm and dry.     Coloration: Skin is not jaundiced or pale.  Neurological:     General: No focal deficit present.     Mental Status: He is alert and oriented to person, place, and time.  Psychiatric:        Mood and Affect: Mood normal.        Behavior: Behavior normal.     ED Results / Procedures / Treatments   Labs (all labs ordered are listed, but only abnormal results are displayed) Labs Reviewed  COMPREHENSIVE METABOLIC PANEL - Abnormal; Notable for the following components:      Result Value   Glucose, Bld 106 (*)     Total Protein 8.9 (*)    AST 48 (*)    All other components within normal limits  CBC WITH DIFFERENTIAL/PLATELET - Abnormal; Notable for the following components:   WBC 15.4 (*)    RBC 4.17 (*)    Hemoglobin 12.3 (*)    HCT 38.0 (*)    Neutro Abs 12.0 (*)    Monocytes Absolute 1.3 (*)    All other components within normal limits  PROTIME-INR - Abnormal; Notable  for the following components:   Prothrombin Time 15.9 (*)    INR 1.3 (*)    All other components within normal limits  BRAIN NATRIURETIC PEPTIDE - Abnormal; Notable for the following components:   B Natriuretic Peptide 383.0 (*)    All other components within normal limits  CULTURE, BLOOD (ROUTINE X 2)  CULTURE, BLOOD (ROUTINE X 2)  RESP PANEL BY RT-PCR (RSV, FLU A&B, COVID)  RVPGX2  LACTIC ACID, PLASMA  MAGNESIUM    EKG None  Radiology DG Chest 2 View Result Date: 11/14/2023 CLINICAL DATA:  Cough and chest pain.  Pneumonia for 1 week. EXAM: CHEST - 2 VIEW COMPARISON:  10/26/2021 and 02/25/2023 FINDINGS: Status post median sternotomy and CABG procedure. Heart size is normal. Small bilateral pleural effusions identified. Chronic diffuse reticular interstitial opacities compatible with fibrotic interstitial lung disease. Bilateral upper lobe airspace consolidation is identified, new from the previous exam. Visualized osseous structures appear intact. IMPRESSION: 1. Bilateral upper lobe airspace consolidation compatible with pneumonia. 2. Small bilateral pleural effusions. 3. Chronic fibrotic interstitial lung disease. Electronically Signed   By: Signa Kell M.D.   On: 11/14/2023 17:35    Procedures Procedures    Medications Ordered in ED Medications  azithromycin (ZITHROMAX) 500 mg in sodium chloride 0.9 % 250 mL IVPB (500 mg Intravenous New Bag/Given 11/14/23 2259)  lactated ringers bolus 500 mL (has no administration in time range)  cefTRIAXone (ROCEPHIN) 1 g in sodium chloride 0.9 % 100 mL IVPB (1 g Intravenous New  Bag/Given 11/14/23 2142)  methylPREDNISolone sodium succinate (SOLU-MEDROL) 125 mg/2 mL injection 125 mg (125 mg Intravenous Given 11/14/23 2140)  ipratropium-albuterol (DUONEB) 0.5-2.5 (3) MG/3ML nebulizer solution 3 mL (3 mLs Nebulization Given 11/14/23 2140)  iohexol (OMNIPAQUE) 350 MG/ML injection 75 mL (75 mLs Intravenous Contrast Given 11/14/23 2249)    ED Course/ Medical Decision Making/ A&P                                 Medical Decision Making Amount and/or Complexity of Data Reviewed Labs: ordered. Radiology: ordered.  Risk Prescription drug management.   This patient presents to the ED for concern of cough and shortness of breath, this involves an extensive number of treatment options, and is a complaint that carries with it a high risk of complications and morbidity.  The differential diagnosis includes pneumonia, neoplasm, exacerbation of interstitial lung disease, CHF   Co morbidities that complicate the patient evaluation  HTN, CAD, pulmonary fibrosis, GERD, arthritis, anemia   Additional history obtained:  Additional history obtained from patient's PCP External records from outside source obtained and reviewed including our   Lab Tests:  I Ordered, and personally interpreted labs.  The pertinent results include: Normal kidney function, normal electrolytes, moderately elevated BNP.  Leukocytosis suggestive of infection.  Anemia is baseline.   Imaging Studies ordered:  I ordered imaging studies including CTA chest I independently visualized and interpreted imaging which showed (pending at time of signout) I agree with the radiologist interpretation   Cardiac Monitoring: / EKG:  The patient was maintained on a cardiac monitor.  I personally viewed and interpreted the cardiac monitored which showed an underlying rhythm of: Sinus rhythm  Problem List / ED Course / Critical interventions / Medication management  Patient presenting for worsening pneumonia,  despite outpatient antibiotics for the past 5 days.  On arrival in the ED, he is found to have a low-grade fever.  On exam, current breathing is unlabored.  He is able to speak in complete sentences.  He has a diminished breath sounds in crackles on lung auscultation.  IV antibiotics were ordered.  Patient to be treated with Solu-Medrol and DuoNeb given history of interstitial lung disease.  Workup was initiated.  Patient did feel improvement after breathing treatment.  He remained hemodynamically stable with normal SpO2 on room air.  CTA showed redemonstration of known multifocal pneumonia.  Patient to be admitted for further management. I ordered medication including IV fluids for hydration; DuoNeb and Solu-Medrol for exacerbation of chronic lung disease; ceftriaxone and azithromycin for multifocal pneumonia Reevaluation of the patient after these medicines showed that the patient improved I have reviewed the patients home medicines and have made adjustments as needed   Social Determinants of Health:  Has PCP        Final Clinical Impression(s) / ED Diagnoses Final diagnoses:  Multifocal pneumonia  Failure of outpatient treatment    Rx / DC Orders ED Discharge Orders     None         Gloris Manchester, MD 11/14/23 2305    Gloris Manchester, MD 11/14/23 2337

## 2023-11-14 NOTE — ED Provider Notes (Signed)
I have discussed the case with Dr. Thomes Dinning of [facility], who agrees to admit the patient.   Dione Booze, MD 11/14/23 [phone]

## 2023-11-14 NOTE — Progress Notes (Addendum)
Acute Office Visit  Subjective:     Patient ID: Jason Spence, male    DOB: 1941/06/10, 83 y.o.   MRN: [id number]   HPI Patient is in today for Pneumonia: Patient here for follow up on pneumonia. Patient describes symptoms of cough and congestion. Patient denies fevers, O2 sats stable . Symptoms began 2 weeks ago and are unchanged since last visit. Patient denies dyspnea or chest pain. Treatment thus far includes OTC analgesics/antipyretics: not very effective and oral antibiotics per medication orders: not very effective Past pulmonary history is significant for  interstitial fibrosis.   Review of Systems  Constitutional:  Negative for chills, fever and weight loss.  HENT:  Positive for congestion. Negative for ear pain and sore throat.   Respiratory:  Positive for cough. Negative for shortness of breath and wheezing.   Cardiovascular:  Negative for chest pain and palpitations.  Musculoskeletal:  Negative for myalgias.  Neurological:  Negative for headaches.        Objective:     BP 133/84   Temp 97.6 F (36.4 C) (Oral)   Ht 5\' 7"  (1.702 m)   Wt 120 lb 3.2 oz (54.5 kg)   SpO2 97%   BMI 18.83 kg/m   Physical Exam Vitals reviewed.  Constitutional:      General: He is not in acute distress.    Appearance: Normal appearance.  HENT:     Nose: Congestion present.     Mouth/Throat:     Mouth: Mucous membranes are moist.     Pharynx: Oropharynx is clear.  Eyes:     Extraocular Movements: Extraocular movements intact.     Conjunctiva/sclera: Conjunctivae normal.  Cardiovascular:     Rate and Rhythm: Normal rate and regular rhythm.     Heart sounds: No murmur heard.    No friction rub. No gallop.  Pulmonary:     Effort: Pulmonary effort is normal.     Breath sounds: No stridor. Wheezing and rales present. No rhonchi.  Musculoskeletal:        General: Normal range of motion.  Lymphadenopathy:     Cervical: No cervical adenopathy.  Skin:    General: Skin is warm  and dry.     Capillary Refill: Capillary refill takes less than 2 seconds.  Neurological:     General: No focal deficit present.     Mental Status: He is alert and oriented to person, place, and time.  Psychiatric:        Mood and Affect: Mood normal.        Behavior: Behavior normal.     No results found for any visits on 11/14/23.      Assessment & Plan:  Community acquired pneumonia of left lower lobe of lung -     DG Chest 2 View -     predniSONE; Take 2 tablets (40 mg total) by mouth daily with breakfast for 2 days, THEN 1 tablet (20 mg total) daily with breakfast for 2 days, THEN 0.5 tablets (10 mg total) daily with breakfast for 2 days.  Dispense: 7 tablet; Refill: 0 -     Promethazine-DM; Take 5 mLs by mouth 4 (four) times daily as needed for cough.  Dispense: 118 mL; Refill: 0  Patient appears stable today. Normal work of breathing, 97% O2 on room air. Some wheezing and rales heard on lungs, hard to distinguish infection from baseline of pulmonary fibrosis. Patient advised to continue antibiotics previously prescribed. Chest x-ray today to rule out  worsening acute lung pathology. Prednisone 6 day taper for lingering inflammation. Cough syrup for symptomatic relief, patient advised bedtime use. Patient has scheduled follow up next week with Dr. Lorin Picket.   Return for previously schedueled visit.  Toni Amend Inaki Vantine, PA-C

## 2023-11-15 DIAGNOSIS — J8417 Interstitial lung disease with progressive fibrotic phenotype in diseases classified elsewhere: Secondary | ICD-10-CM | POA: Diagnosis not present

## 2023-11-15 DIAGNOSIS — J181 Lobar pneumonia, unspecified organism: Secondary | ICD-10-CM | POA: Diagnosis not present

## 2023-11-15 DIAGNOSIS — J8410 Pulmonary fibrosis, unspecified: Secondary | ICD-10-CM

## 2023-11-15 LAB — RESPIRATORY PANEL BY PCR

## 2023-11-15 LAB — PROCALCITONIN: Procalcitonin: <0.1 ng/mL

## 2023-11-15 LAB — MRSA NEXT GEN BY PCR, NASAL: MRSA by PCR Next Gen: NOT DETECTED

## 2023-11-15 MED ORDER — IPRATROPIUM-ALBUTEROL 0.5-2.5 (3) MG/3ML IN SOLN
3.0000 mL | Freq: Two times a day (BID) | RESPIRATORY_TRACT | Status: DC
  Administered 2023-11-16 – 2023-11-17 (×3): 3 mL via RESPIRATORY_TRACT
  Filled 2023-11-15 (×3): qty 3

## 2023-11-15 MED ORDER — PANTOPRAZOLE SODIUM 40 MG PO TBEC
40.0000 mg | DELAYED_RELEASE_TABLET | Freq: Two times a day (BID) | ORAL | Status: DC
  Administered 2023-11-15 – 2023-11-16 (×4): 40 mg via ORAL
  Filled 2023-11-15 (×5): qty 1

## 2023-11-15 MED ORDER — AZITHROMYCIN 250 MG PO TABS
500.0000 mg | ORAL_TABLET | Freq: Every day | ORAL | Status: DC
  Administered 2023-11-15 – 2023-11-16 (×2): 500 mg via ORAL
  Filled 2023-11-15 (×3): qty 2

## 2023-11-15 MED ORDER — LORATADINE 10 MG PO TABS
10.0000 mg | ORAL_TABLET | Freq: Every day | ORAL | Status: DC
  Administered 2023-11-15 – 2023-11-17 (×3): 10 mg via ORAL
  Filled 2023-11-15 (×3): qty 1

## 2023-11-15 MED ORDER — VITAMIN C 500 MG PO TABS
1000.0000 mg | ORAL_TABLET | Freq: Every day | ORAL | Status: DC
  Administered 2023-11-15 – 2023-11-17 (×3): 1000 mg via ORAL
  Filled 2023-11-15 (×3): qty 2

## 2023-11-15 MED ORDER — PRAVASTATIN SODIUM 40 MG PO TABS
40.0000 mg | ORAL_TABLET | Freq: Every day | ORAL | Status: DC
  Administered 2023-11-15: 40 mg via ORAL
  Filled 2023-11-15: qty 1

## 2023-11-15 MED ORDER — BUDESONIDE 0.5 MG/2ML IN SUSP
0.5000 mg | Freq: Two times a day (BID) | RESPIRATORY_TRACT | Status: DC
  Administered 2023-11-15 – 2023-11-17 (×5): 0.5 mg via RESPIRATORY_TRACT
  Filled 2023-11-15 (×5): qty 2

## 2023-11-15 MED ORDER — ENOXAPARIN SODIUM 40 MG/0.4ML IJ SOSY
40.0000 mg | PREFILLED_SYRINGE | INTRAMUSCULAR | Status: DC
  Administered 2023-11-15 – 2023-11-16 (×2): 40 mg via SUBCUTANEOUS
  Filled 2023-11-15 (×2): qty 0.4

## 2023-11-15 MED ORDER — ACETAMINOPHEN 650 MG RE SUPP: 650.0000 mg | Freq: Four times a day (QID) | RECTAL | Status: DC | PRN

## 2023-11-15 MED ORDER — ONDANSETRON HCL 4 MG/2ML IJ SOLN: 4.0000 mg | Freq: Four times a day (QID) | INTRAMUSCULAR | Status: DC | PRN

## 2023-11-15 MED ORDER — OMEGA-3-ACID ETHYL ESTERS 1 G PO CAPS
1.0000 g | ORAL_CAPSULE | Freq: Every day | ORAL | Status: DC
  Administered 2023-11-15 – 2023-11-16 (×2): 1 g via ORAL
  Filled 2023-11-15 (×3): qty 1

## 2023-11-15 MED ORDER — ARFORMOTEROL TARTRATE 15 MCG/2ML IN NEBU
15.0000 ug | INHALATION_SOLUTION | Freq: Two times a day (BID) | RESPIRATORY_TRACT | Status: DC
  Administered 2023-11-15 – 2023-11-17 (×5): 15 ug via RESPIRATORY_TRACT
  Filled 2023-11-15 (×5): qty 2

## 2023-11-15 MED ORDER — FERROUS SULFATE 325 (65 FE) MG PO TABS
325.0000 mg | ORAL_TABLET | Freq: Every day | ORAL | Status: DC
  Administered 2023-11-16 – 2023-11-17 (×2): 325 mg via ORAL
  Filled 2023-11-15 (×2): qty 1

## 2023-11-15 MED ORDER — IPRATROPIUM-ALBUTEROL 0.5-2.5 (3) MG/3ML IN SOLN
3.0000 mL | Freq: Four times a day (QID) | RESPIRATORY_TRACT | Status: DC
  Administered 2023-11-15 (×3): 3 mL via RESPIRATORY_TRACT
  Filled 2023-11-15 (×3): qty 3

## 2023-11-15 MED ORDER — SODIUM CHLORIDE 0.9 % IV SOLN
1.0000 g | INTRAVENOUS | Status: DC
  Administered 2023-11-15 – 2023-11-16 (×2): 1 g via INTRAVENOUS
  Filled 2023-11-15 (×2): qty 10

## 2023-11-15 MED ORDER — ACETAMINOPHEN 325 MG PO TABS: 650.0000 mg | ORAL_TABLET | Freq: Four times a day (QID) | ORAL | Status: DC | PRN

## 2023-11-15 MED ORDER — ASPIRIN 81 MG PO CHEW
81.0000 mg | CHEWABLE_TABLET | Freq: Every day | ORAL | Status: DC
  Administered 2023-11-15 – 2023-11-17 (×3): 81 mg via ORAL
  Filled 2023-11-15 (×3): qty 1

## 2023-11-15 MED ORDER — ONDANSETRON HCL 4 MG PO TABS: 4.0000 mg | ORAL_TABLET | Freq: Four times a day (QID) | ORAL | Status: DC | PRN

## 2023-11-15 MED ORDER — METHYLPREDNISOLONE SODIUM SUCC 40 MG IJ SOLR
40.0000 mg | Freq: Two times a day (BID) | INTRAMUSCULAR | Status: DC
  Administered 2023-11-15 – 2023-11-16 (×3): 40 mg via INTRAVENOUS
  Filled 2023-11-15 (×3): qty 1

## 2023-11-15 MED ORDER — OCUVITE-LUTEIN PO CAPS
1.0000 | ORAL_CAPSULE | Freq: Two times a day (BID) | ORAL | Status: DC
  Administered 2023-11-15 – 2023-11-17 (×5): 1 via ORAL
  Filled 2023-11-15 (×5): qty 1

## 2023-11-15 NOTE — TOC CM/SW Note (Signed)
Transition of Care [facility]) - Inpatient Brief Assessment   Patient Details  Name: Jason Spence MRN: [id number] Date of Birth: 03/02/1941  Transition of Care [facility]) CM/SW Contact:    Villa Herb, LCSWA Phone Number: 11/15/2023, 10:48 AM   Clinical Narrative: Tranisition of Care Department [facility]) has reviewed patient and no TOC needs have been ifentified at this time. We will continue to monitor patient advancement through interdisciplinary progression rounds. If new patient transition needs arise, please place a TOC consult.   Transition of Care Asessment: Insurance and Status: Insurance coverage has been reviewed Patient has primary care physician: Yes Home environment has been reviewed: From home Prior level of function:: Independent Prior/Current Home Services: No current home services Social Drivers of Health Review: SDOH reviewed no interventions necessary Readmission risk has been reviewed: Yes Transition of care needs: no transition of care needs at this time

## 2023-11-15 NOTE — Hospital Course (Signed)
Jason Spence is a  83 year old male with a history of hypertension, coronary disease, pulmonary fibrosis, GERD, hyperlipidemia presenting with nearly 2-week history of coughing, shortness breath, and chest congestion.  The patient has some subjective fevers and chills at home.  He went to see his PCP on 11/08/2022, and the patient was placed on Augmentin and doxycycline.  He followed up with his PCP on 11/14/2023 because he was not feeling much better.  He was directed to come to the hospital for further evaluation and treatment.  The patient states that his cough is largely nonproductive.  She denies any hemoptysis.  He did have 2 episodes of nausea and vomiting on 11/14/2023.  He denies any diarrhea, abdominal pain, hematochezia, melena.  He denies any chest pain or abdominal pain.  There is no dysuria or hematuria.  The patient denies any other new medications. In the ED, the patient was febrile up to 100.4 F.  He was hemodynamically stable with oxygen saturation 95-96% room air.  WBC 15.4, hemoglobin 12.3, platelets 364.  Sodium 136, potassium 3.9, bicarbonate 24, serum creatinine 0.69.  AST 48, ALT 44, alk phosphatase 83, total bilirubin 1.0.  COVID-19 PCR is negative.  BNP 383.  EKG shows sinus rhythm with no ST ST wave changes.  CTA chest was negative for pulmonary embolus.  There is multifocal areas of consolidation in the bilateral upper lobes.  There are stable fibrotic changes.  The patient was started on ceftriaxone and azithromycin.     Assessment & Plan:   Principal Problem:   CAP (community acquired pneumonia) Active Problems:   Essential hypertension, benign   Pulmonary fibrosis (HCC)   Interstitial lung disease with progressive fibrotic phenotype in diseases classified elsewhere (HCC)   Lobar pneumonia (HCC)  Lobar pneumonia -Continues to have shortness with exertion, -Afebrile, worsening leukocytosis  15.4>> 20.2 -Procalcitonin <1.10 -Continue ceftriaxone and  azithromycin -11/14/2023 CTA chest--negative PE, multi focal areas of consolidation in the upper lobes, stable fibrotic changes -Viral respiratory panel negative -COVID-19 PCR/influenza/RSV--negative   Exacerbation of pulmonary fibrosis -Start IV Solu-Medrol --wean off to p.o. prednisone -Start DuoNebs -Start Pulmicort   Essential hypertension -Restarting metoprolol -was Holding metoprolol and ramipril secondary to soft blood pressure   Coronary artery disease -No chest pain presently -Continue aspirin -Resuming metoprolol -Continue statin   Mixed hyperlipidemia -Continue statin   GERD -Continue pantoprazole

## 2023-11-15 NOTE — H&P (Signed)
History and Physical    Patient: Jason Spence WGN:[id number] DOB: [birth year] DOA: 11/14/2023 DOS: the patient was seen and examined on 11/15/2023 PCP: Babs Sciara, MD  Patient coming from: Home  Chief Complaint: cough sob  HPI: Jason Spence is a 83 year old male with a history of hypertension, coronary disease, pulmonary fibrosis, GERD, hyperlipidemia presenting with nearly 2-week history of coughing, shortness breath, and chest congestion.  The patient has some subjective fevers and chills at home.  He went to see his PCP on 11/08/2022, and the patient was placed on Augmentin and doxycycline.  He followed up with his PCP on 11/14/2023 because he was not feeling much better.  He was directed to come to the hospital for further evaluation and treatment.  The patient states that his cough is largely nonproductive.  She denies any hemoptysis.  He did have 2 episodes of nausea and vomiting on 11/14/2023.  He denies any diarrhea, abdominal pain, hematochezia, melena.  He denies any chest pain or abdominal pain.  There is no dysuria or hematuria.  The patient denies any other new medications. In the ED, the patient was febrile up to 100.4 F.  He was hemodynamically stable with oxygen saturation 95-96% room air.  WBC 15.4, hemoglobin 12.3, platelets 364.  Sodium 136, potassium 3.9, bicarbonate 24, serum creatinine 0.69.  AST 48, ALT 44, alk phosphatase 83, total bilirubin 1.0.  COVID-19 PCR is negative.  BNP 383.  EKG shows sinus rhythm with no ST ST wave changes.  CTA chest was negative for pulmonary embolus.  There is multifocal areas of consolidation in the bilateral upper lobes.  There are stable fibrotic changes.  The patient was started on ceftriaxone and azithromycin.  Review of Systems: As mentioned in the history of present illness. All other systems reviewed and are negative. Past Medical History:  Diagnosis Date   Anal stricture 10/18/2006   Arthritis    Hypercholesteremia     Hypertension    Myocardial infarct [facility])    Past Surgical History:  Procedure Laterality Date   BALLOON DILATION N/A 01/05/2013   Procedure: BALLOON DILATION;  Surgeon: Malissa Hippo, MD;  [facility];  Service: Endoscopy;  Laterality: N/A;   BIOPSY  02/27/2018   Procedure: BIOPSY;  Surgeon: Malissa Hippo, MD;  [facility];  Service: Endoscopy;;  esophagus   BIOPSY  08/05/2022   Procedure: BIOPSY;  Surgeon: Dolores Frame, MD;  [facility];  Service: Gastroenterology;;   COLONOSCOPY WITH ESOPHAGOGASTRODUODENOSCOPY (EGD) N/A 01/05/2013   Procedure: COLONOSCOPY WITH ESOPHAGOGASTRODUODENOSCOPY (EGD);  Surgeon: Malissa Hippo, MD;  [facility];  Service: Endoscopy;  Laterality: N/A;  100   COLONOSCOPY WITH PROPOFOL N/A 08/05/2022   Procedure: COLONOSCOPY WITH PROPOFOL;  Surgeon: Dolores Frame, MD;  [facility];  Service: Gastroenterology;  Laterality: N/A;  1030 ASA 3   CORONARY ARTERY BYPASS GRAFT     ESOPHAGEAL DILATION N/A 02/27/2018   Procedure: ESOPHAGEAL DILATION;  Surgeon: Malissa Hippo, MD;  [facility];  Service: Endoscopy;  Laterality: N/A;   ESOPHAGOGASTRODUODENOSCOPY N/A 02/27/2018   Procedure: ESOPHAGOGASTRODUODENOSCOPY (EGD);  Surgeon: Malissa Hippo, MD;  [facility];  Service: Endoscopy;  Laterality: N/A;  12:25   ESOPHAGOGASTRODUODENOSCOPY (EGD) WITH PROPOFOL N/A 08/05/2022   Procedure: ESOPHAGOGASTRODUODENOSCOPY (EGD) WITH PROPOFOL;  Surgeon: Dolores Frame, MD;  [facility];  Service: Gastroenterology;  Laterality: N/A;   GIVENS CAPSULE STUDY N/A 08/24/2022  Procedure: GIVENS CAPSULE STUDY;  Surgeon: Dolores Frame, MD;  [facility];  Service: Gastroenterology;  Laterality: N/A;  730 am   HERNIA REPAIR     POLYPECTOMY  08/05/2022   Procedure: POLYPECTOMY;  Surgeon: Dolores Frame, MD;  [facility];  Service: Gastroenterology;;   Social History:  reports that he quit smoking about 28 years ago. His smoking use included cigarettes. He has been exposed to tobacco smoke. He has never used smokeless tobacco. He reports that he does not currently use alcohol. He reports that he does not use drugs.  No Known Allergies  Family History  Problem Relation Age of Onset   Hypertension Sister     Prior to Admission medications   Medication Sig Start Date End Date Taking? Authorizing Provider  albuterol (VENTOLIN HFA) 108 (90 Base) MCG/ACT inhaler Inhale 2 puffs into the lungs every 4 (four) hours as needed for wheezing. 08/13/21  Yes Babs Sciara, MD  amoxicillin-clavulanate (AUGMENTIN) 875-125 MG tablet Take 1 tablet by mouth 2 (two) times daily. 11/09/23  Yes Babs Sciara, MD  Ascorbic Acid (VITAMIN C) 1000 MG tablet Take 1,000 mg by mouth daily.   Yes [provider]  aspirin 81 MG tablet Take 1 tablet (81 mg total) by mouth daily. 02/28/18  Yes Rehman, Joline Maxcy, MD  budesonide-formoterol (BREYNA) 160-4.5 MCG/ACT inhaler INHALE 2 PUFFS BY MOUTH TWICE DAILY -  RINSE  MOUTH  WITH  WATER  AFTER  EACH  USE 01/04/23  Yes [name], Jonna Coup, MD  cetirizine (ZYRTEC ALLERGY) 10 MG tablet Take 1 tablet (10 mg total) by mouth daily. 10/26/21  Yes Particia Nearing, PA-C  Cholecalciferol (VITAMIN D3 PO) Take 1 tablet by mouth daily.   Yes [provider]  doxycycline (VIBRA-TABS) 100 MG tablet Take 1 tablet (100 mg total) by mouth 2 (two) times daily. 11/09/23  Yes Babs Sciara, MD  ferrous sulfate 325 (65 FE) MG tablet Take 1 tablet (325 mg total) by mouth daily with breakfast. 05/26/23  Yes Carlan, Chelsea L, NP  fish oil-omega-3 fatty acids 1000 MG capsule Take 1 g by mouth daily.    Yes [provider]  ketoconazole (NIZORAL) 2 % cream APPLY CREAM TOPICALLY TO AFFECTED AREA TWICE DAILY AS NEEDED 08/01/23  Yes [name], Jonna Coup, MD  meclizine (ANTIVERT) 25 MG tablet Take  1 tablet (25 mg total) by mouth 3 (three) times daily as needed for dizziness. 09/19/21  Yes Tegeler, Canary Brim, MD  metoprolol tartrate (LOPRESSOR) 25 MG tablet Take 1 tablet (25 mg total) by mouth 2 (two) times daily. 10/20/23  Yes Mallipeddi, Vishnu P, MD  Multiple Vitamins-Minerals (PRESERVISION AREDS 2 PO) Take 1 capsule by mouth 2 (two) times daily.   Yes [provider]  pantoprazole (PROTONIX) 40 MG tablet TAKE 1 TABLET BY MOUTH ONCE DAILY 30 MINUTES BEFORE BREAKFAST 09/09/23  Yes Babs Sciara, MD  pravastatin (PRAVACHOL) 40 MG tablet Take 1 tablet by mouth once daily 09/30/23  Yes [name], Jonna Coup, MD  predniSONE (DELTASONE) 20 MG tablet Take 2 tablets (40 mg total) by mouth daily with breakfast for 2 days, THEN 1 tablet (20 mg total) daily with breakfast for 2 days, THEN 0.5 tablets (10 mg total) daily with breakfast for 2 days. 11/14/23 11/20/23 Yes Grooms, Courtney, PA-C  promethazine-dextromethorphan (PROMETHAZINE-DM) 6.25-15 MG/5ML syrup Take 5 mLs by mouth 4 (four) times daily as needed for cough. 11/14/23  Yes Grooms, [place]ton, [place]  ramipril (ALTACE) 10 MG capsule Take 1 capsule by mouth once daily 10/31/23  Yes [name], [name] A, MD  triamcinolone cream (KENALOG) 0.1 % Apply 1 Application topically daily. Patient not taking: Reported on 11/15/2023 06/15/22   Babs Sciara, MD    Physical Exam: Vitals:   11/15/23 0058 11/15/23 0059 11/15/23 0400 11/15/23 0410  BP:   (!) 128/58 (!) 113/46  Pulse: 73  73 65  Resp:   18 18  Temp: 98.2 F (36.8 C)  98.2 F (36.8 C) 97.6 F (36.4 C)  TempSrc: Oral  Oral Axillary  SpO2:    96%  Weight:  53.6 kg 53.6 kg   Height:   5' 7.01" (1.702 m)    GENERAL:  A&O x 3, NAD, well developed, cooperative, follows commands HEENT: [place]/AT, No thrush, No icterus, No oral ulcers Neck:  No neck mass, No meningismus, soft, supple CV: RRR, no S3, no S4, no rub, no JVD Lungs:  bilateral rales. No wheeze Abd: soft/NT +BS, nondistended Ext: No  edema, no lymphangitis, no cyanosis, no rashes Neuro:  CN II-XII intact, strength 4/5 in RUE, RLE, strength 4/5 LUE, LLE; sensation intact bilateral; no dysmetria; babinski equivocal  Data Reviewed: Data reviewed above in history  Assessment and Plan: Lobar pneumonia -Check MRSA screen -Check PCT -Continue ceftriaxone and azithromycin -11/14/2023 CTA chest--negative PE, multi focal areas of consolidation in the upper lobes, stable fibrotic changes -Viral respiratory panel -COVID-19 PCR/influenza/RSV--negative  Exacerbation of pulmonary fibrosis -Start IV Solu-Medrol -Start DuoNebs -Start Pulmicort  Essential hypertension -Holding metoprolol and ramipril secondary to soft blood pressure  Coronary artery disease -No chest pain presently -Continue aspirin -Holding metoprolol temporarily -Continue statin  Mixed hyperlipidemia -Continue statin  GERD -Continue pantoprazole    Advance Care Planning: FULL  Consults: none  Family Communication: none present  Severity of Illness: The appropriate patient status for this patient is INPATIENT. Inpatient status is judged to be reasonable and necessary in order to provide the required intensity of service to ensure the patient's safety. The patient's presenting symptoms, physical exam findings, and initial radiographic and laboratory data in the context of their chronic comorbidities is felt to place them at high risk for further clinical deterioration. Furthermore, it is not anticipated that the patient will be medically stable for discharge from the hospital within 2 midnights of admission.   * I certify that at the point of admission it is my clinical judgment that the patient will require inpatient hospital care spanning beyond 2 midnights from the point of admission due to high intensity of service, high risk for further deterioration and high frequency of surveillance required.*  Author: Catarina Hartshorn, MD 11/15/2023 9:38 AM  For  on call review [email or web address].

## 2023-11-15 NOTE — Plan of Care (Signed)
  Problem: Clinical Measurements: Goal: Will remain free from infection Outcome: Progressing   Problem: Pain Managment: Goal: General experience of comfort will improve and/or be controlled Outcome: Progressing

## 2023-11-16 DIAGNOSIS — J189 Pneumonia, unspecified organism: Secondary | ICD-10-CM

## 2023-11-16 LAB — COMPREHENSIVE METABOLIC PANEL
ALT: 51 U/L — ABNORMAL HIGH (ref 0–44)
AST: 51 U/L — ABNORMAL HIGH (ref 15–41)
Albumin: 2.4 g/dL — ABNORMAL LOW (ref 3.5–5.0)
Alkaline Phosphatase: 61 U/L (ref 38–126)
Anion gap: 7 (ref 5–15)
BUN: 21 mg/dL (ref 8–23)
CO2: 26 mmol/L (ref 22–32)
Calcium: 8.8 mg/dL — ABNORMAL LOW (ref 8.9–10.3)
Chloride: 102 mmol/L (ref 98–111)
Creatinine, Ser: 0.7 mg/dL (ref 0.61–1.24)
GFR, Estimated: >60 mL/min (ref >60)
Glucose, Bld: 175 mg/dL — ABNORMAL HIGH (ref 70–99)
Potassium: 3.7 mmol/L (ref 3.5–5.1)
Sodium: 135 mmol/L (ref 135–145)
Total Bilirubin: 0.4 mg/dL (ref 0.0–1.2)
Total Protein: 6.2 g/dL — ABNORMAL LOW (ref 6.5–8.1)

## 2023-11-16 LAB — CBC
HCT: 30.5 % — ABNORMAL LOW (ref 39.0–52.0)
Hemoglobin: 9.9 g/dL — ABNORMAL LOW (ref 13.0–17.0)
MCH: 28.9 pg (ref 26.0–34.0)
MCHC: 32.5 g/dL (ref 30.0–36.0)
MCV: 88.9 fL (ref 80.0–100.0)
Platelets: 297 10*3/uL (ref 150–400)
RBC: 3.43 MIL/uL — ABNORMAL LOW (ref 4.22–5.81)
RDW: 12.8 % (ref 11.5–15.5)
WBC: 20.2 10*3/uL — ABNORMAL HIGH (ref 4.0–10.5)
nRBC: 0 % (ref 0.0–0.2)

## 2023-11-16 MED ORDER — PRAVASTATIN SODIUM 40 MG PO TABS: 40.0000 mg | ORAL_TABLET | Freq: Every day | ORAL | Status: DC

## 2023-11-16 MED ORDER — METOPROLOL TARTRATE 25 MG PO TABS
12.5000 mg | ORAL_TABLET | Freq: Two times a day (BID) | ORAL | Status: DC
  Administered 2023-11-16 – 2023-11-17 (×3): 12.5 mg via ORAL
  Filled 2023-11-16 (×3): qty 1

## 2023-11-16 NOTE — Progress Notes (Signed)
Patient has rested well. No acute events overnight.  Vitals have been stable.

## 2023-11-16 NOTE — Plan of Care (Signed)
  Problem: Clinical Measurements: Goal: Will remain free from infection Outcome: Progressing   Problem: Pain Managment: Goal: General experience of comfort will improve and/or be controlled Outcome: Progressing

## 2023-11-16 NOTE — Progress Notes (Signed)
PROGRESS NOTE    Patient: Jason Spence                            PCP: Babs Sciara, MD                    DOB: 1941/02/02            DOA: 11/14/2023 MVH:[id number]             DOS: 11/16/2023, 12:31 PM   LOS: 2 days   Date of Service: The patient was seen and examined on 11/16/2023  Subjective:   The patient was seen and examined this morning. Hemodynamically stable.  Mild shortness of breath with exertion No issues overnight .  Brief Narrative:   Jason Spence is a  83 year old male with a history of hypertension, coronary disease, pulmonary fibrosis, GERD, hyperlipidemia presenting with nearly 2-week history of coughing, shortness breath, and chest congestion.  The patient has some subjective fevers and chills at home.  He went to see his PCP on 11/08/2022, and the patient was placed on Augmentin and doxycycline.  He followed up with his PCP on 11/14/2023 because he was not feeling much better.  He was directed to come to the hospital for further evaluation and treatment.  The patient states that his cough is largely nonproductive.  She denies any hemoptysis.  He did have 2 episodes of nausea and vomiting on 11/14/2023.  He denies any diarrhea, abdominal pain, hematochezia, melena.  He denies any chest pain or abdominal pain.  There is no dysuria or hematuria.  The patient denies any other new medications. In the ED, the patient was febrile up to 100.4 F.  He was hemodynamically stable with oxygen saturation 95-96% room air.  WBC 15.4, hemoglobin 12.3, platelets 364.  Sodium 136, potassium 3.9, bicarbonate 24, serum creatinine 0.69.  AST 48, ALT 44, alk phosphatase 83, total bilirubin 1.0.  COVID-19 PCR is negative.  BNP 383.  EKG shows sinus rhythm with no ST ST wave changes.  CTA chest was negative for pulmonary embolus.  There is multifocal areas of consolidation in the bilateral upper lobes.  There are stable fibrotic changes.  The patient was started on ceftriaxone and  azithromycin.     Assessment & Plan:   Principal Problem:   CAP (community acquired pneumonia) Active Problems:   Essential hypertension, benign   Pulmonary fibrosis (HCC)   Interstitial lung disease with progressive fibrotic phenotype in diseases classified elsewhere (HCC)   Lobar pneumonia (HCC)  Lobar pneumonia -Continues to have shortness with exertion, -Afebrile, worsening leukocytosis  15.4>> 20.2 -Procalcitonin <1.10 -Continue ceftriaxone and azithromycin -11/14/2023 CTA chest--negative PE, multi focal areas of consolidation in the upper lobes, stable fibrotic changes -Viral respiratory panel negative -COVID-19 PCR/influenza/RSV--negative   Exacerbation of pulmonary fibrosis -Start IV Solu-Medrol --wean off to p.o. prednisone -Start DuoNebs -Start Pulmicort   Essential hypertension -Restarting metoprolol -was Holding metoprolol and ramipril secondary to soft blood pressure   Coronary artery disease -No chest pain presently -Continue aspirin -Resuming metoprolol -Continue statin   Mixed hyperlipidemia -Continue statin   GERD -Continue pantoprazole     ----------------------------------------------------------------------------------------------------------------------------------------------- Nutritional status:  The patient's BMI is: Body mass index is 18.5 kg/m. I agree with the assessment and plan as outlined b--------------------------------------------------------------------------------------------------------------------------- Cultures; Blood Cultures x 2 >> Sputum culture-was not obtained   ------------------------------------------------------------------------------------------------------------------------------------------------  DVT prophylaxis:  enoxaparin (LOVENOX) injection 40 mg Start: 11/15/23 2200  Code Status:   Code Status: Full Code  Family Communication: Son present at bedside-updated -Advance care planning has been  discussed.   Admission status:   Status is: Inpatient Remains inpatient appropriate because: Needing IV antibiotics   Disposition: From  - home             Planning for discharge in 1-2 days: to   Procedures:   No admission procedures for hospital encounter.   Antimicrobials:  Anti-infectives (From admission, onward)    Start     Dose/Rate Route Frequency Ordered Stop   11/15/23 2200  azithromycin (ZITHROMAX) tablet 500 mg        500 mg Oral Daily 11/15/23 0954     11/15/23 2000  cefTRIAXone (ROCEPHIN) 1 g in sodium chloride 0.9 % 100 mL IVPB        1 g 200 mL/hr over 30 Minutes Intravenous Every 24 hours 11/15/23 0954     11/14/23 2045  cefTRIAXone (ROCEPHIN) 1 g in sodium chloride 0.9 % 100 mL IVPB        1 g 200 mL/hr over 30 Minutes Intravenous  Once 11/14/23 2036 11/15/23 0000   11/14/23 2045  azithromycin (ZITHROMAX) 500 mg in sodium chloride 0.9 % 250 mL IVPB        500 mg 250 mL/hr over 60 Minutes Intravenous  Once 11/14/23 2036 11/15/23 0000        Medication:   arformoterol  15 mcg Nebulization BID   vitamin C  1,000 mg Oral Daily   aspirin  81 mg Oral Daily   azithromycin  500 mg Oral Daily   budesonide (PULMICORT) nebulizer solution  0.5 mg Nebulization BID   enoxaparin (LOVENOX) injection  40 mg Subcutaneous Q24H   ferrous sulfate  325 mg Oral Q breakfast   ipratropium-albuterol  3 mL Nebulization BID   loratadine  10 mg Oral Daily   methylPREDNISolone (SOLU-MEDROL) injection  40 mg Intravenous Q12H   multivitamin-lutein  1 capsule Oral BID   omega-3 acid ethyl esters  1 g Oral Daily   pantoprazole  40 mg Oral BID AC   pravastatin  40 mg Oral q1800    acetaminophen **OR** acetaminophen, ondansetron **OR** ondansetron (ZOFRAN) IV   Objective:   Vitals:   11/16/23 0803 11/16/23 0804 11/16/23 0809 11/16/23 0833  BP:    (!) 125/58  Pulse:    81  Resp:      Temp:      TempSrc:      SpO2: 96% 99% 100% 100%  Weight:      Height:         Intake/Output Summary (Last 24 hours) at 11/16/2023 1231 Last data filed at 11/16/2023 0900 Gross per 24 hour  Intake 1020 ml  Output --  Net 1020 ml   Filed Weights   11/15/23 0038 11/15/23 0059 11/15/23 0400  Weight: 53.6 kg 53.6 kg 53.6 kg         General:  AAO x 3,  cooperative, no distress;   HEENT:  Normocephalic, PERRL, otherwise with in Normal limits   Neuro:  CNII-XII intact. , normal motor and sensation, reflexes intact   Lungs:   Clear to auscultation BL, Respirations unlabored,  No wheezes / crackles  Cardio:    S1/S2, RRR, No murmure, No Rubs or Gallops   Abdomen:  Soft, non-tender, bowel sounds active all four quadrants, no guarding or peritoneal signs.  Muscular  skeletal:  Limited exam -global generalized weaknesses - in bed, able  to move all 4 extremities,   2+ pulses,  symmetric, No pitting edema  Skin:  Dry, warm to touch, negative for any Rashes,  Wounds: Please see nursing documentation         ------------------------------------------------------------------------------------------------------------------------------------------    LABs:     Latest Ref Rng & Units 11/16/2023    4:27 AM 11/14/2023    9:08 PM 04/04/2023    9:17 AM  CBC  WBC 4.0 - 10.5 K/uL 20.2  15.4  7.7   Hemoglobin 13.0 - 17.0 g/dL 9.9  63.8  75.6   Hematocrit 39.0 - 52.0 % 30.5  38.0  40.0   Platelets 150 - 400 K/uL 297  364  237       Latest Ref Rng & Units 11/16/2023    4:27 AM 11/14/2023    9:08 PM 07/22/2023    8:47 AM  CMP  Glucose 70 - 99 mg/dL 433  295  89   BUN 8 - 23 mg/dL 21  17  13    Creatinine 0.61 - 1.24 mg/dL 1.88  4.16  6.06   Sodium 135 - 145 mmol/L 135  136  139   Potassium 3.5 - 5.1 mmol/L 3.7  3.9  4.3   Chloride 98 - 111 mmol/L 102  99  99   CO2 22 - 32 mmol/L 26  24  26    Calcium 8.9 - 10.3 mg/dL 8.8  9.5    Total Protein 6.5 - 8.1 g/dL 6.2  8.9  7.5   Total Bilirubin 0.0 - 1.2 mg/dL 0.4  1.0  0.5   Alkaline Phos 38 - 126 U/L 61  83  95    AST 15 - 41 U/L 51  48  21   ALT 0 - 44 U/L 51  44  12        Micro Results Recent Results (from the past 240 hours)  Blood Culture (routine x 2)     Status: None (Preliminary result)   Collection Time: 11/14/23  9:08 PM   Specimen: BLOOD  Result Value Ref Range Status   Specimen Description BLOOD BLOOD LEFT ARM  Final   Special Requests   Final    BOTTLES DRAWN AEROBIC AND ANAEROBIC Blood Culture adequate volume   Culture   Final    NO GROWTH 2 DAYS Performed at [facility], [address] [place], [place] [address]    Report Status PENDING  Incomplete  Blood Culture (routine x 2)     Status: None (Preliminary result)   Collection Time: 11/14/23  9:43 PM   Specimen: BLOOD  Result Value Ref Range Status   Specimen Description BLOOD BLOOD RIGHT ARM  Final   Special Requests   Final    BOTTLES DRAWN AEROBIC AND ANAEROBIC Blood Culture adequate volume   Culture   Final    NO GROWTH 2 DAYS Performed at [facility], [address] [place], [place] [address]    Report Status PENDING  Incomplete  Resp panel by RT-PCR (RSV, Flu A&B, Covid) Anterior Nasal Swab     Status: None   Collection Time: 11/14/23  9:43 PM   Specimen: Anterior Nasal Swab  Result Value Ref Range Status   SARS Coronavirus 2 by RT PCR NEGATIVE NEGATIVE Final    Comment: (NOTE) SARS-CoV-2 target nucleic acids are NOT DETECTED.  The SARS-CoV-2 RNA is generally detectable in upper respiratory specimens during the acute phase of infection. The lowest concentration of SARS-CoV-2 viral copies this assay can detect  is 138 copies/mL. A negative result does not preclude SARS-Cov-2 infection and should not be used as the sole basis for treatment or other patient management decisions. A negative result may occur with  improper specimen collection/handling, submission of specimen other than nasopharyngeal swab, presence of viral mutation(s) within the areas targeted by this assay, and inadequate number of  viral copies(<138 copies/mL). A negative result must be combined with clinical observations, patient history, and epidemiological information. The expected result is Negative.  Fact Sheet for Patients:  BloggerCourse.com  Fact Sheet for Healthcare Providers:  SeriousBroker.it  This test is no t yet approved or cleared by the [place] FDA and  has been authorized for detection and/or diagnosis of SARS-CoV-2 by FDA under an Emergency Use Authorization (EUA). This EUA will remain  in effect (meaning this test can be used) for the duration of the COVID-19 declaration under Section 564(b)(1) of the Act, 21 U.S.C.section 360bbb-3(b)(1), unless the authorization is terminated  or revoked sooner.       Influenza A by PCR NEGATIVE NEGATIVE Final   Influenza B by PCR NEGATIVE NEGATIVE Final    Comment: (NOTE) The Xpert Xpress SARS-CoV-2/FLU/RSV plus assay is intended as an aid in the diagnosis of influenza from Nasopharyngeal swab specimens and should not be used as a sole basis for treatment. Nasal washings and aspirates are unacceptable for Xpert Xpress SARS-CoV-2/FLU/RSV testing.  Fact Sheet for Patients: BloggerCourse.com  Fact Sheet for Healthcare Providers: SeriousBroker.it  This test is not yet approved or cleared by the [place] FDA and has been authorized for detection and/or diagnosis of SARS-CoV-2 by FDA under an Emergency Use Authorization (EUA). This EUA will remain in effect (meaning this test can be used) for the duration of the COVID-19 declaration under Section 564(b)(1) of the Act, 21 U.S.C. section 360bbb-3(b)(1), unless the authorization is terminated or revoked.     Resp Syncytial Virus by PCR NEGATIVE NEGATIVE Final    Comment: (NOTE) Fact Sheet for Patients: BloggerCourse.com  Fact Sheet for Healthcare  Providers: SeriousBroker.it  This test is not yet approved or cleared by the [place] FDA and has been authorized for detection and/or diagnosis of SARS-CoV-2 by FDA under an Emergency Use Authorization (EUA). This EUA will remain in effect (meaning this test can be used) for the duration of the COVID-19 declaration under Section 564(b)(1) of the Act, 21 U.S.C. section 360bbb-3(b)(1), unless the authorization is terminated or revoked.  Performed at [facility], [address], [place], [place] [address]   MRSA Next Gen by PCR, Nasal     Status: None   Collection Time: 11/15/23  9:54 AM   Specimen: Nasal Mucosa; Nasal Swab  Result Value Ref Range Status   MRSA by PCR Next Gen NOT DETECTED NOT DETECTED Final    Comment: (NOTE) The GeneXpert MRSA Assay (FDA approved for NASAL specimens only), is one component of a comprehensive MRSA colonization surveillance program. It is not intended to diagnose MRSA infection nor to guide or monitor treatment for MRSA infections. Test performance is not FDA approved in patients less than 73 years old. Performed at [facility], [address] [place], [place] [address]   Respiratory (~20 pathogens) panel by PCR     Status: None   Collection Time: 11/15/23  9:54 AM   Specimen: Nasopharyngeal Swab; Respiratory  Result Value Ref Range Status   Adenovirus NOT DETECTED NOT DETECTED Final   Coronavirus 229E NOT DETECTED NOT DETECTED Final    Comment: (NOTE) The Coronavirus  on the Respiratory Panel, DOES NOT test for the novel  Coronavirus (2019 nCoV)    Coronavirus HKU1 NOT DETECTED NOT DETECTED Final   Coronavirus NL63 NOT DETECTED NOT DETECTED Final   Coronavirus OC43 NOT DETECTED NOT DETECTED Final   Metapneumovirus NOT DETECTED NOT DETECTED Final   Rhinovirus / Enterovirus NOT DETECTED NOT DETECTED Final   Influenza A NOT DETECTED NOT DETECTED Final   Influenza B NOT DETECTED NOT DETECTED Final   Parainfluenza  Virus 1 NOT DETECTED NOT DETECTED Final   Parainfluenza Virus 2 NOT DETECTED NOT DETECTED Final   Parainfluenza Virus 3 NOT DETECTED NOT DETECTED Final   Parainfluenza Virus 4 NOT DETECTED NOT DETECTED Final   Respiratory Syncytial Virus NOT DETECTED NOT DETECTED Final   Bordetella pertussis NOT DETECTED NOT DETECTED Final   Bordetella Parapertussis NOT DETECTED NOT DETECTED Final   Chlamydophila pneumoniae NOT DETECTED NOT DETECTED Final   Mycoplasma pneumoniae NOT DETECTED NOT DETECTED Final    Comment: Performed at [facility], [address] [place], [place] [address]    Radiology Reports No results found.  SIGNED: Kendell Bane, MD, FHM. FAAFP. [facility] - [facility] Time spent - 55 min.  In seeing, evaluating and examining the patient. Reviewing medical records, labs, drawn plan of care. [facility],  Pager (please use [email or web address] to page/ text) Please use Epic Secure Chat for non-urgent communication (7AM-7PM)  If 7PM-7AM, please contact night-coverage [email or web address], 11/16/2023, 12:31 PM

## 2023-11-17 DIAGNOSIS — J189 Pneumonia, unspecified organism: Secondary | ICD-10-CM | POA: Diagnosis not present

## 2023-11-17 LAB — CBC WITH DIFFERENTIAL/PLATELET
Abs Immature Granulocytes: 0.13 10*3/uL — ABNORMAL HIGH (ref 0.00–0.07)
Basophils Absolute: 0 10*3/uL (ref 0.0–0.1)
Basophils Relative: 0 %
Eosinophils Absolute: 0 10*3/uL (ref 0.0–0.5)
Eosinophils Relative: 0 %
HCT: 34 % — ABNORMAL LOW (ref 39.0–52.0)
Hemoglobin: 10.7 g/dL — ABNORMAL LOW (ref 13.0–17.0)
Immature Granulocytes: 1 %
Lymphocytes Relative: 9 %
Lymphs Abs: 1.7 10*3/uL (ref 0.7–4.0)
MCH: 28.8 pg (ref 26.0–34.0)
MCHC: 31.5 g/dL (ref 30.0–36.0)
MCV: 91.4 fL (ref 80.0–100.0)
Monocytes Absolute: 1.2 10*3/uL — ABNORMAL HIGH (ref 0.1–1.0)
Monocytes Relative: 7 %
Neutro Abs: 14.6 10*3/uL — ABNORMAL HIGH (ref 1.7–7.7)
Neutrophils Relative %: 83 %
Platelets: 335 10*3/uL (ref 150–400)
RBC: 3.72 MIL/uL — ABNORMAL LOW (ref 4.22–5.81)
RDW: 13.2 % (ref 11.5–15.5)
WBC: 17.6 10*3/uL — ABNORMAL HIGH (ref 4.0–10.5)
nRBC: 0 % (ref 0.0–0.2)

## 2023-11-17 MED ORDER — LEVOFLOXACIN 500 MG PO TABS
750.0000 mg | ORAL_TABLET | Freq: Every day | ORAL | Status: DC
  Administered 2023-11-17: 750 mg via ORAL
  Filled 2023-11-17: qty 2

## 2023-11-17 MED ORDER — LEVOFLOXACIN 750 MG PO TABS: 750.0000 mg | ORAL_TABLET | Freq: Every day | ORAL | 0 refills | Status: DC

## 2023-11-17 MED ORDER — PANTOPRAZOLE SODIUM 40 MG PO TBEC
40.0000 mg | DELAYED_RELEASE_TABLET | Freq: Every day | ORAL | Status: DC
  Administered 2023-11-17: 40 mg via ORAL
  Filled 2023-11-17: qty 1

## 2023-11-17 MED ORDER — RISAQUAD PO CAPS: 2.0000 | ORAL_CAPSULE | Freq: Three times a day (TID) | ORAL | 0 refills | Status: DC

## 2023-11-17 MED ORDER — RISAQUAD PO CAPS
2.0000 | ORAL_CAPSULE | Freq: Three times a day (TID) | ORAL | Status: DC
  Administered 2023-11-17: 2 via ORAL
  Filled 2023-11-17: qty 2

## 2023-11-17 NOTE — Care Management Important Message (Signed)
Important Message  Patient Details  Name: Jason Spence MRN: [id number] Date of Birth: 1940-12-27   Important Message Given:  N/A - LOS <3 / Initial given by admissions     Corey Harold 11/17/2023, 10:25 AM

## 2023-11-17 NOTE — Discharge Summary (Signed)
Physician Discharge Summary   Patient: Jason Spence MRN: [id number] DOB: Oct 22, 1940  Admit date:     11/14/2023  Discharge date: 11/17/23  Discharge Physician: Kendell Bane   PCP: Babs Sciara, MD   Recommendations at discharge:  Follow-up with PCP in 2-4 weeks Continue and complete current course of antibiotics Current home medication subject to change by PCP  Discharge Diagnoses: Principal Problem:   CAP (community acquired pneumonia) Active Problems:   Essential hypertension, benign   Pulmonary fibrosis (HCC)   Interstitial lung disease with progressive fibrotic phenotype in diseases classified elsewhere (HCC)   Lobar pneumonia (HCC)  Resolved Problems:   * No resolved hospital problems. *  Hospital Course: Jason Spence is a  83 year old male with a history of hypertension, coronary disease, pulmonary fibrosis, GERD, hyperlipidemia presenting with nearly 2-week history of coughing, shortness breath, and chest congestion.  The patient has some subjective fevers and chills at home.  He went to see his PCP on 11/08/2022, and the patient was placed on Augmentin and doxycycline.  He followed up with his PCP on 11/14/2023 because he was not feeling much better.  He was directed to come to the hospital for further evaluation and treatment.  The patient states that his cough is largely nonproductive.  She denies any hemoptysis.  He did have 2 episodes of nausea and vomiting on 11/14/2023.  He denies any diarrhea, abdominal pain, hematochezia, melena.  He denies any chest pain or abdominal pain.  There is no dysuria or hematuria.  The patient denies any other new medications. In the ED, the patient was febrile up to 100.4 F.  He was hemodynamically stable with oxygen saturation 95-96% room air.  WBC 15.4, hemoglobin 12.3, platelets 364.  Sodium 136, potassium 3.9, bicarbonate 24, serum creatinine 0.69.  AST 48, ALT 44, alk phosphatase 83, total bilirubin 1.0.  COVID-19 PCR is  negative.  BNP 383.  EKG shows sinus rhythm with no ST ST wave changes.  CTA chest was negative for pulmonary embolus.  There is multifocal areas of consolidation in the bilateral upper lobes.  There are stable fibrotic changes.  The patient was started on ceftriaxone and azithromycin.   Lobar pneumonia -Continues to have shortness with exertion, -Afebrile, worsening leukocytosis  15.4>> 20.2 >>> 17.6 -Procalcitonin <1.10,  <0.10  -Was on IV antibiotics of ceftriaxone and azithromycin>> switch to p.o. Levaquin Cultures-no growth to date -11/14/2023 CTA chest--negative PE, multi focal areas of consolidation in the upper lobes, stable fibrotic changes -Viral respiratory panel negative -COVID-19 PCR/influenza/RSV--negative   Exacerbation of pulmonary fibrosis -Start IV Solu-Medrol --wean off to p.o. prednisone -Start DuoNebs -Start Pulmicort   Essential hypertension -Restarting metoprolol -was Holding metoprolol and ramipril secondary to soft blood pressure   Coronary artery disease -No chest pain presently -Continue aspirin -Resuming metoprolol -Continue statin   Mixed hyperlipidemia -Continue statin   GERD -Continue pantoprazole  Consultants: None Procedures performed: None Disposition: Home Diet recommendation:  Discharge Diet Orders (From admission, onward)     Start     Ordered   11/17/23 0000  Diet - low sodium heart healthy        11/17/23 1107           Cardiac diet DISCHARGE MEDICATION: Allergies as of 11/17/2023   No Known Allergies      Medication List     STOP taking these medications    amoxicillin-clavulanate 875-125 MG tablet Commonly known as: AUGMENTIN   doxycycline 100 MG tablet Commonly  known as: VIBRA-TABS   triamcinolone cream 0.1 % Commonly known as: KENALOG       TAKE these medications    acidophilus Caps capsule Take 2 capsules by mouth 3 (three) times daily.   albuterol 108 (90 Base) MCG/ACT inhaler Commonly known as:  VENTOLIN HFA Inhale 2 puffs into the lungs every 4 (four) hours as needed for wheezing.   aspirin 81 MG tablet Take 1 tablet (81 mg total) by mouth daily.   Breyna 160-4.5 MCG/ACT inhaler Generic drug: budesonide-formoterol INHALE 2 PUFFS BY MOUTH TWICE DAILY -  RINSE  MOUTH  WITH  WATER  AFTER  EACH  USE   cetirizine 10 MG tablet Commonly known as: ZyrTEC Allergy Take 1 tablet (10 mg total) by mouth daily.   ferrous sulfate 325 (65 FE) MG tablet Take 1 tablet (325 mg total) by mouth daily with breakfast.   fish oil-omega-3 fatty acids 1000 MG capsule Take 1 g by mouth daily.   ketoconazole 2 % cream Commonly known as: NIZORAL APPLY CREAM TOPICALLY TO AFFECTED AREA TWICE DAILY AS NEEDED   levofloxacin 750 MG tablet Commonly known as: LEVAQUIN Take 1 tablet (750 mg total) by mouth daily. Start taking on: November 18, 2023   meclizine 25 MG tablet Commonly known as: ANTIVERT Take 1 tablet (25 mg total) by mouth 3 (three) times daily as needed for dizziness.   metoprolol tartrate 25 MG tablet Commonly known as: LOPRESSOR Take 1 tablet (25 mg total) by mouth 2 (two) times daily.   pantoprazole 40 MG tablet Commonly known as: PROTONIX TAKE 1 TABLET BY MOUTH ONCE DAILY 30 MINUTES BEFORE BREAKFAST   pravastatin 40 MG tablet Commonly known as: PRAVACHOL Take 1 tablet by mouth once daily   predniSONE 20 MG tablet Commonly known as: DELTASONE Take 2 tablets (40 mg total) by mouth daily with breakfast for 2 days, THEN 1 tablet (20 mg total) daily with breakfast for 2 days, THEN 0.5 tablets (10 mg total) daily with breakfast for 2 days. Start taking on: November 14, 2023   PRESERVISION AREDS 2 PO Take 1 capsule by mouth 2 (two) times daily.   promethazine-dextromethorphan 6.25-15 MG/5ML syrup Commonly known as: PROMETHAZINE-DM Take 5 mLs by mouth 4 (four) times daily as needed for cough.   ramipril 10 MG capsule Commonly known as: ALTACE Take 1 capsule by mouth once  daily   vitamin C 1000 MG tablet Take 1,000 mg by mouth daily.   VITAMIN D3 PO Take 1 tablet by mouth daily.        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   Imaging Studies: CT Angio Chest PE W and/or Wo Contrast Result Date: 11/14/2023 CLINICAL DATA:  High probability for PE.  Patient had pneumonia. EXAM: CT ANGIOGRAPHY CHEST WITH CONTRAST TECHNIQUE: Multidetector CT imaging of the chest was performed using the standard protocol during bolus administration of intravenous contrast. Multiplanar CT image reconstructions and MIPs were obtained to evaluate the vascular anatomy. RADIATION DOSE REDUCTION: This exam was performed according to the departmental dose-optimization program which includes automated exposure control, adjustment of the mA and/or kV according to patient size and/or use of iterative reconstruction technique. CONTRAST:  75mL OMNIPAQUE IOHEXOL 350 MG/ML SOLN COMPARISON:  CT chest 02/25/2023 FINDINGS: Cardiovascular: Heart is enlarged. Aorta is normal in size. There are atherosclerotic calcifications of the aorta and coronary arteries. Patient is status post cardiac surgery. There is adequate opacification of the pulmonary arteries to the segmental level. There is no evidence for pulmonary embolism. Mediastinum/Nodes: There are enlarged right hilar lymph nodes measuring up to 16 mm which have increased from prior. There are enlarged left hilar lymph nodes measuring up to 11 mm which have increased from prior. Enlarged subcarinal lymph node measures 12 mm, increased from prior. The thyroid gland and esophagus are within normal limits. Lungs/Pleura:  There are new areas of multifocal airspace consolidation within the bilateral upper lobes. Fibrotic changes persists peripherally in both lungs, predominantly in the lung bases. Pleural-parenchymal scarring is unchanged in the lung apices. There is a stable pulmonary nodule in the left lower lobe measuring 12 mm. This is low attenuation compatible with benign hamartoma unchanged. Upper Abdomen: No acute abnormality. Musculoskeletal: No acute fractures are seen. Sternotomy wires are present. Review of the MIP images confirms the above findings. IMPRESSION: 1. No evidence for pulmonary embolism. 2. New areas of multifocal airspace consolidation in the bilateral upper lobes compatible with pneumonia. 3. Stable fibrotic changes in the lungs. 4. 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 5. Cardiomegaly. 6. Aortic atherosclerosis. Aortic Atherosclerosis (ICD10-I70.0). Electronically Signed   By: Darliss Cheney M.D.   On: 11/14/2023 23:31   DG Chest 2 View Result Date: 11/14/2023 CLINICAL DATA:  Cough and chest pain.  Pneumonia for 1 week. EXAM: CHEST - 2 VIEW COMPARISON:  10/26/2021 and 02/25/2023 FINDINGS: Status post median sternotomy and CABG procedure. Heart size is normal. Small bilateral pleural effusions identified. Chronic diffuse reticular interstitial opacities compatible with fibrotic interstitial lung disease. Bilateral upper lobe airspace consolidation is identified, new from the previous exam. Visualized osseous structures appear intact. IMPRESSION: 1. Bilateral upper lobe airspace consolidation compatible with pneumonia. 2. Small bilateral pleural effusions. 3. Chronic fibrotic interstitial lung disease. Electronically Signed   By: Signa Kell M.D.   On: 11/14/2023 17:35    Microbiology: Results for orders placed or performed during the hospital encounter of 11/14/23  Blood Culture (routine x 2)     Status: None (Preliminary result)   Collection Time: 11/14/23  9:08 PM   Specimen: BLOOD   Result Value Ref Range Status   Specimen Description BLOOD BLOOD LEFT ARM  Final   Special Requests   Final    BOTTLES DRAWN AEROBIC AND ANAEROBIC Blood Culture adequate volume   Culture   Final    NO GROWTH 3 DAYS Performed at [facility], [address], [place], [place] [address]    Report Status PENDING  Incomplete  Blood Culture (routine x 2)     Status: None (Preliminary result)   Collection Time: 11/14/23  9:43 PM   Specimen: BLOOD  Result Value Ref Range Status   Specimen Description BLOOD BLOOD RIGHT ARM  Final   Special Requests   Final    BOTTLES DRAWN AEROBIC AND ANAEROBIC Blood Culture adequate volume   Culture   Final    NO GROWTH 3 DAYS Performed at [facility], [address] [place], [place] [address]    Report Status PENDING  Incomplete  Resp panel by RT-PCR (RSV, Flu A&B, Covid) Anterior Nasal Swab     Status: None   Collection Time: 11/14/23  9:43 PM   Specimen: Anterior Nasal Swab  Result  Value Ref Range Status   SARS Coronavirus 2 by RT PCR NEGATIVE NEGATIVE Final    Comment: (NOTE) SARS-CoV-2 target nucleic acids are NOT DETECTED.  The SARS-CoV-2 RNA is generally detectable in upper respiratory specimens during the acute phase of infection. The lowest concentration of SARS-CoV-2 viral copies this assay can detect is 138 copies/mL. A negative result does not preclude SARS-Cov-2 infection and should not be used as the sole basis for treatment or other patient management decisions. A negative result may occur with  improper specimen collection/handling, submission of specimen other than nasopharyngeal swab, presence of viral mutation(s) within the areas targeted by this assay, and inadequate number of viral copies(<138 copies/mL). A negative result must be combined with clinical observations, patient history, and epidemiological information. The expected result is Negative.  Fact Sheet for Patients:  BloggerCourse.com  Fact  Sheet for Healthcare Providers:  SeriousBroker.it  This test is no t yet approved or cleared by the [place] FDA and  has been authorized for detection and/or diagnosis of SARS-CoV-2 by FDA under an Emergency Use Authorization (EUA). This EUA will remain  in effect (meaning this test can be used) for the duration of the COVID-19 declaration under Section 564(b)(1) of the Act, 21 U.S.C.section 360bbb-3(b)(1), unless the authorization is terminated  or revoked sooner.       Influenza A by PCR NEGATIVE NEGATIVE Final   Influenza B by PCR NEGATIVE NEGATIVE Final    Comment: (NOTE) The Xpert Xpress SARS-CoV-2/FLU/RSV plus assay is intended as an aid in the diagnosis of influenza from Nasopharyngeal swab specimens and should not be used as a sole basis for treatment. Nasal washings and aspirates are unacceptable for Xpert Xpress SARS-CoV-2/FLU/RSV testing.  Fact Sheet for Patients: BloggerCourse.com  Fact Sheet for Healthcare Providers: SeriousBroker.it  This test is not yet approved or cleared by the [place] FDA and has been authorized for detection and/or diagnosis of SARS-CoV-2 by FDA under an Emergency Use Authorization (EUA). This EUA will remain in effect (meaning this test can be used) for the duration of the COVID-19 declaration under Section 564(b)(1) of the Act, 21 U.S.C. section 360bbb-3(b)(1), unless the authorization is terminated or revoked.     Resp Syncytial Virus by PCR NEGATIVE NEGATIVE Final    Comment: (NOTE) Fact Sheet for Patients: BloggerCourse.com  Fact Sheet for Healthcare Providers: SeriousBroker.it  This test is not yet approved or cleared by the [place] FDA and has been authorized for detection and/or diagnosis of SARS-CoV-2 by FDA under an Emergency Use Authorization (EUA). This EUA will remain in effect  (meaning this test can be used) for the duration of the COVID-19 declaration under Section 564(b)(1) of the Act, 21 U.S.C. section 360bbb-3(b)(1), unless the authorization is terminated or revoked.  Performed at [facility], [address], [place], [place] [address]   MRSA Next Gen by PCR, Nasal     Status: None   Collection Time: 11/15/23  9:54 AM   Specimen: Nasal Mucosa; Nasal Swab  Result Value Ref Range Status   MRSA by PCR Next Gen NOT DETECTED NOT DETECTED Final    Comment: (NOTE) The GeneXpert MRSA Assay (FDA approved for NASAL specimens only), is one component of a comprehensive MRSA colonization surveillance program. It is not intended to diagnose MRSA infection nor to guide or monitor treatment for MRSA infections. Test performance is not FDA approved in patients less than 75 years old. Performed at [facility], [address] [place], [place] [address]   Respiratory (~  20 pathogens) panel by PCR     Status: None   Collection Time: 11/15/23  9:54 AM   Specimen: Nasopharyngeal Swab; Respiratory  Result Value Ref Range Status   Adenovirus NOT DETECTED NOT DETECTED Final   Coronavirus 229E NOT DETECTED NOT DETECTED Final    Comment: (NOTE) The Coronavirus on the Respiratory Panel, DOES NOT test for the novel  Coronavirus (2019 nCoV)    Coronavirus HKU1 NOT DETECTED NOT DETECTED Final   Coronavirus NL63 NOT DETECTED NOT DETECTED Final   Coronavirus OC43 NOT DETECTED NOT DETECTED Final   Metapneumovirus NOT DETECTED NOT DETECTED Final   Rhinovirus / Enterovirus NOT DETECTED NOT DETECTED Final   Influenza A NOT DETECTED NOT DETECTED Final   Influenza B NOT DETECTED NOT DETECTED Final   Parainfluenza Virus 1 NOT DETECTED NOT DETECTED Final   Parainfluenza Virus 2 NOT DETECTED NOT DETECTED Final   Parainfluenza Virus 3 NOT DETECTED NOT DETECTED Final   Parainfluenza Virus 4 NOT DETECTED NOT DETECTED Final   Respiratory Syncytial Virus NOT DETECTED NOT DETECTED Final    Bordetella pertussis NOT DETECTED NOT DETECTED Final   Bordetella Parapertussis NOT DETECTED NOT DETECTED Final   Chlamydophila pneumoniae NOT DETECTED NOT DETECTED Final   Mycoplasma pneumoniae NOT DETECTED NOT DETECTED Final    Comment: Performed at [facility], [address], [place], [place] [address]    Labs: CBC: Recent Labs  Lab 11/14/23 2108 11/16/23 0427 11/17/23 0804  WBC 15.4* 20.2* 17.6*  NEUTROABS 12.0*  --  14.6*  HGB 12.3* 9.9* 10.7*  HCT 38.0* 30.5* 34.0*  MCV 91.1 88.9 91.4  PLT 364 297 335   Basic Metabolic Panel: Recent Labs  Lab 11/14/23 2108 11/16/23 0427  NA 136 135  K 3.9 3.7  CL 99 102  CO2 24 26  GLUCOSE 106* 175*  BUN 17 21  CREATININE 0.69 0.70  CALCIUM 9.5 8.8*  MG 2.1  --    Liver Function Tests: Recent Labs  Lab 11/14/23 2108 11/16/23 0427  AST 48* 51*  ALT 44 51*  ALKPHOS 83 61  BILITOT 1.0 0.4  PROT 8.9* 6.2*  ALBUMIN 3.8 2.4*   CBG: No results for input(s): "GLUCAP" in the last 168 hours.  Discharge time spent: greater than 45 minutes.  Signed: Kendell Bane, MD [facility] 11/17/2023

## 2023-11-17 NOTE — Plan of Care (Signed)
  Problem: Clinical Measurements: Goal: Will remain free from infection Outcome: Progressing   Problem: Pain Managment: Goal: General experience of comfort will improve and/or be controlled Outcome: Progressing

## 2023-11-18 ENCOUNTER — Telehealth: Payer: Self-pay | Admitting: *Deleted

## 2023-11-18 NOTE — Transitions of Care (Post Inpatient/ED Visit) (Signed)
   11/18/2023  Name: KHAIDEN SEGRETO MRN: [id number] DOB: 1941-07-05  Today's TOC FU Call Status: Today's TOC FU Call Status:: Unsuccessful Call (1st Attempt) Unsuccessful Call (1st Attempt) Date: 11/18/23  Attempted to reach the patient regarding the most recent Inpatient/ED visit.  Follow Up Plan: Additional outreach attempts will be made to reach the patient to complete the Transitions of Care (Post Inpatient/ED visit) call.   Irving Shows [facility], BSN RN Care Manager/ Transition of Care [place]/ [facility] [phone]

## 2023-11-19 LAB — CULTURE, BLOOD (ROUTINE X 2)
Culture: NO GROWTH
Culture: NO GROWTH
Special Requests: ADEQUATE
Special Requests: ADEQUATE

## 2023-11-21 ENCOUNTER — Telehealth: Payer: Self-pay | Admitting: *Deleted

## 2023-11-21 NOTE — Transitions of Care (Post Inpatient/ED Visit) (Signed)
11/21/2023  Name: Jason Spence MRN: [id number] DOB: July 05, 1941  Today's TOC FU Call Status: Today's TOC FU Call Status:: Successful TOC FU Call Completed TOC FU Call Complete Date: 11/21/23 Patient's Name and Date of Birth confirmed.  Transition Care Management Follow-up Telephone Call Date of Discharge: 11/17/23 [facility] Type of Discharge: Inpatient Admission Primary Inpatient Discharge Diagnosis:: CAP (Community acquired pneumonia) How have you been since you were released from the hospital?:  (pt states eating well, has been using dulcolax to have BM's, had BM today and plans to use miralax going forward, pt ambulating well, drinking adequate fluids, still has cough and using cough syrup and states " this helps") Any questions or concerns?: No  Items Reviewed: Did you receive and understand the discharge instructions provided?: Yes Medications obtained,verified, and reconciled?: Yes (Medications Reviewed) Any new allergies since your discharge?: No Dietary orders reviewed?: Yes Type of Diet Ordered:: low sodium   heart healthy Do you have support at home?: Yes People in Home: child(ren), adult Name of Support/Comfort Primary Source: lives with adult son and daughter in law Reviewed signs/ symptoms infection, pneumonia, reportable signs/ symptoms Reviewed drinking adequate fluids, high fiber diet, ambulating to prevent constipation Reviewed all  upcoming scheduled appointments  Medications Reviewed Today: Medications Reviewed Today     Reviewed by Audrie Gallus, RN (Registered Nurse) on 11/21/23 at 1413  Med List Status: <None>   Medication Order Taking? Sig Documenting Provider Last Dose Status Informant  acidophilus (RISAQUAD) CAPS capsule [id number] No Take 2 capsules by mouth 3 (three) times daily.  Patient not taking: Reported on 11/21/2023   Kendell Bane, MD Not Taking Active   albuterol (VENTOLIN HFA) 108 (90 Base) MCG/ACT inhaler  [id number] Yes Inhale 2 puffs into the lungs every 4 (four) hours as needed for wheezing. Babs Sciara, MD Taking Active Self  Ascorbic Acid (VITAMIN C) 1000 MG tablet [id number] Yes Take 1,000 mg by mouth daily. [provider] Taking Active Self  aspirin 81 MG tablet [id number] Yes Take 1 tablet (81 mg total) by mouth daily. Malissa Hippo, MD Taking Active Self  budesonide-formoterol [facility]) 160-4.5 MCG/ACT inhaler [id number] Yes INHALE 2 PUFFS BY MOUTH TWICE DAILY -  RINSE  MOUTH  WITH  WATER  AFTER  EACH  USE Babs Sciara, MD Taking Active Self  cetirizine (ZYRTEC ALLERGY) 10 MG tablet [id number] Yes Take 1 tablet (10 mg total) by mouth daily. Particia Nearing, [place] Taking Active Self  Cholecalciferol (VITAMIN D3 PO) [id number] Yes Take 1 tablet by mouth daily. [provider] Taking Active Self  ferrous sulfate 325 (65 FE) MG tablet [id number] Yes Take 1 tablet (325 mg total) by mouth daily with breakfast. Carlan, Chelsea L, NP Taking Active Self  fish oil-omega-3 fatty acids 1000 MG capsule [id number] Yes Take 1 g by mouth daily.  [provider] Taking Active Self  ketoconazole (NIZORAL) 2 % cream [id number] Yes APPLY CREAM TOPICALLY TO AFFECTED AREA TWICE DAILY AS NEEDED [name], Jonna Coup, MD Taking Active Self  levofloxacin (LEVAQUIN) 750 MG tablet [id number] Yes Take 1 tablet (750 mg total) by mouth daily. Kendell Bane, MD Taking Active   meclizine (ANTIVERT) 25 MG tablet [id number] Yes Take 1 tablet (25 mg total) by mouth 3 (three) times daily as needed for dizziness. Tegeler, Canary Brim, MD Taking Active Self  metoprolol tartrate (LOPRESSOR) 25 MG tablet [id number] Yes Take 1 tablet (25 mg total) by mouth 2 (two) times daily.  Mallipeddi, Orion Modest, MD Taking Active Self  Multiple Vitamins-Minerals (PRESERVISION AREDS 2 PO) [id number] Yes Take 1 capsule by mouth 2 (two) times daily. [provider] Taking Active Self  pantoprazole (PROTONIX) 40 MG  tablet [id number] Yes TAKE 1 TABLET BY MOUTH ONCE DAILY 30 MINUTES BEFORE BREAKFAST [name], [name] A, MD Taking Active Self  pravastatin (PRAVACHOL) 40 MG tablet [id number] Yes Take 1 tablet by mouth once daily [name], Jonna Coup, MD Taking Active Self  promethazine-dextromethorphan (PROMETHAZINE-DM) 6.25-15 MG/5ML syrup [id number] Yes Take 5 mLs by mouth 4 (four) times daily as needed for cough. Grooms, [place], [place] Taking Active Self  ramipril (ALTACE) 10 MG capsule [id number] Yes Take 1 capsule by mouth once daily [name], Jonna Coup, MD Taking Active Self            Home Care and Equipment/Supplies: Were Home Health Services Ordered?: No Any new equipment or medical supplies ordered?: No  Functional Questionnaire: Do you need assistance with bathing/showering or dressing?: No Do you need assistance with meal preparation?: No Do you need assistance with eating?: No Do you have difficulty maintaining continence: No Do you need assistance with getting out of bed/getting out of a chair/moving?: No Do you have difficulty managing or taking your medications?: Yes (son and dtr in law provide oversight)  Follow up appointments reviewed: PCP Follow-up appointment confirmed?: Yes Date of PCP follow-up appointment?: 11/23/23 Follow-up Provider: Dr. Lilyan Punt  @ 330 pm Specialist Hospital Follow-up appointment confirmed?: No Reason Specialist Follow-Up Not Confirmed: Patient has Specialist Provider Number and will Call for Appointment Do you need transportation to your follow-up appointment?: No Do you understand care options if your condition(s) worsen?: Yes-patient verbalized understanding  SDOH Interventions Today    Flowsheet Row Most Recent Value  SDOH Interventions   Food Insecurity Interventions Intervention Not Indicated  Housing Interventions Intervention Not Indicated  Transportation Interventions Intervention Not Indicated  Utilities Interventions Intervention Not Indicated        Irving Shows [facility], BSN RN Care Manager/ Transition of Care [place]/ [facility] Population Health [phone]

## 2023-11-23 ENCOUNTER — Ambulatory Visit (INDEPENDENT_AMBULATORY_CARE_PROVIDER_SITE_OTHER): Payer: Medicare HMO | Admitting: Family Medicine

## 2023-11-23 VITALS — BP 122/69 | HR 72 | Temp 97.9°F | Ht 67.0 in | Wt 112.0 lb

## 2023-11-23 DIAGNOSIS — J8417 Interstitial lung disease with progressive fibrotic phenotype in diseases classified elsewhere: Secondary | ICD-10-CM | POA: Diagnosis not present

## 2023-11-23 DIAGNOSIS — R7401 Elevation of levels of liver transaminase levels: Secondary | ICD-10-CM

## 2023-11-23 DIAGNOSIS — D509 Iron deficiency anemia, unspecified: Secondary | ICD-10-CM | POA: Diagnosis not present

## 2023-11-23 DIAGNOSIS — J189 Pneumonia, unspecified organism: Secondary | ICD-10-CM

## 2023-11-23 NOTE — Progress Notes (Addendum)
   Subjective:    Patient ID: Jason Spence, male    DOB: 10-Apr-1941, 83 y.o.   MRN: [id number]  Discussed the use of AI scribe software for clinical note transcription with the patient, who gave verbal consent to proceed. Hospitalization follow-up Transition of care phone call had been placed  History of Present Illness   Jason Spence is an 83 year old male who presents with ongoing fatigue and shortness of breath following recent pneumonia.  He was recently hospitalized for pneumonia and continues to experience lingering symptoms. He feels fatigued and run down, with variability in symptom severity. No recent fevers have been noted, but there was a brief episode of vomiting, possibly related to eating soup before his hospital admission.  His breathing has improved since hospitalization but has not returned to baseline. He experiences shortness of breath and struggles to use the incentive spirometer effectively. He sleeps with two pillows, which is his usual practice, and manages some walking around the house but lacks energy.  His appetite is poor, although he managed to eat a small portion of scrambled eggs with cheese and toast, which made him feel slightly better. He mentions that his dog, Roscoe, ate a significant portion of the meal.  He is concerned about his recovery, noting that he thought he was getting better but feels it is a slow process. He feels worse in the mornings and has considered returning to the hospital. He also notes that his eyes seem to have worsened, possibly due to lack of nutrition.         Review of Systems     Objective:    Physical Exam   CHEST: Air movement present bilaterally, no wheezing detected. Air movement audible in lower lung fields, indicating no fluid accumulation.      Gen-NAD not toxic TMS-normal bilateral T- normal no redness Chest-CTA respiratory rate normal no crackles CV RRR no murmur Skin-warm dry Neuro-grossly  normal      Assessment & Plan:    We will go ahead and check lab work to make sure he is not having iron deficient anemia issues because hemoglobin did go down toward the end of his hospitalization Also to with elevated transaminase repeat liver profile In addition to this chest x-ray and a follow-up office visit in several weeks if progressive symptoms are worse follow-up sooner  Patient does have high blood pressure.  Continue current measures  Hyperlipidemia-importance of diet, weight control, activity, compliance with medications discussed.   Recent labs reviewed.   Any additional labs or refills ordered.   Importance of keeping under good control discussed. Regular follow-up visits discussed  Interstitial lung disease recent infection pneumonia follow-up chest x-ray  Anemia on lab work ordered await results  Recent hospitalization follow-up chest x-ray in several weeks  Follow-up within 6 weeks

## 2023-12-14 ENCOUNTER — Ambulatory Visit (HOSPITAL_COMMUNITY)
Admission: RE | Admit: 2023-12-14 | Discharge: 2023-12-14 | Disposition: A | Discharge status: Home / Self Care | Payer: Medicare HMO | Source: Ambulatory Visit | Attending: Family Medicine | Admitting: Family Medicine

## 2023-12-14 DIAGNOSIS — R918 Other nonspecific abnormal finding of lung field: Secondary | ICD-10-CM | POA: Diagnosis not present

## 2023-12-14 DIAGNOSIS — I700 Atherosclerosis of aorta: Secondary | ICD-10-CM | POA: Diagnosis not present

## 2023-12-14 DIAGNOSIS — J849 Interstitial pulmonary disease, unspecified: Secondary | ICD-10-CM | POA: Insufficient documentation

## 2023-12-14 DIAGNOSIS — Z09 Encounter for follow-up examination after completed treatment for conditions other than malignant neoplasm: Secondary | ICD-10-CM | POA: Insufficient documentation

## 2023-12-14 DIAGNOSIS — J8417 Interstitial lung disease with progressive fibrotic phenotype in diseases classified elsewhere: Secondary | ICD-10-CM | POA: Insufficient documentation

## 2023-12-14 DIAGNOSIS — D509 Iron deficiency anemia, unspecified: Secondary | ICD-10-CM | POA: Diagnosis not present

## 2023-12-14 DIAGNOSIS — J984 Other disorders of lung: Secondary | ICD-10-CM | POA: Diagnosis not present

## 2023-12-14 DIAGNOSIS — R7401 Elevation of levels of liver transaminase levels: Secondary | ICD-10-CM | POA: Diagnosis not present

## 2023-12-15 DIAGNOSIS — B351 Tinea unguium: Secondary | ICD-10-CM | POA: Diagnosis not present

## 2023-12-15 DIAGNOSIS — M79674 Pain in right toe(s): Secondary | ICD-10-CM | POA: Diagnosis not present

## 2023-12-15 DIAGNOSIS — M79675 Pain in left toe(s): Secondary | ICD-10-CM | POA: Diagnosis not present

## 2023-12-15 LAB — IRON,TIBC AND FERRITIN PANEL
Ferritin: 104 ng/mL (ref 30–400)
Iron Saturation: 23 % (ref 15–55)
Iron: 62 ug/dL (ref 38–169)
Total Iron Binding Capacity: 270 ug/dL (ref 250–450)
UIBC: 208 ug/dL (ref 111–343)

## 2023-12-15 LAB — BASIC METABOLIC PANEL
BUN/Creatinine Ratio: 17 (ref 10–24)
BUN: 14 mg/dL (ref 8–27)
CO2: 26 mmol/L (ref 20–29)
Calcium: 9.3 mg/dL (ref 8.6–10.2)
Chloride: 101 mmol/L (ref 96–106)
Creatinine, Ser: 0.84 mg/dL (ref 0.76–1.27)
Glucose: 92 mg/dL (ref 70–99)
Potassium: 4.5 mmol/L (ref 3.5–5.2)
Sodium: 140 mmol/L (ref 134–144)
eGFR: 87 mL/min/{1.73_m2} (ref >59)

## 2023-12-15 LAB — CBC WITH DIFFERENTIAL/PLATELET
Basophils Absolute: 0.1 10*3/uL (ref 0.0–0.2)
Basos: 1 %
EOS (ABSOLUTE): 1 10*3/uL — ABNORMAL HIGH (ref 0.0–0.4)
Eos: 13 %
Hematocrit: 39.4 % (ref 37.5–51.0)
Hemoglobin: 12.7 g/dL — ABNORMAL LOW (ref 13.0–17.7)
Immature Grans (Abs): 0 10*3/uL (ref 0.0–0.1)
Immature Granulocytes: 0 %
Lymphocytes Absolute: 1.3 10*3/uL (ref 0.7–3.1)
Lymphs: 16 %
MCH: 28.6 pg (ref 26.6–33.0)
MCHC: 32.2 g/dL (ref 31.5–35.7)
MCV: 89 fL (ref 79–97)
Monocytes Absolute: 0.7 10*3/uL (ref 0.1–0.9)
Monocytes: 9 %
Neutrophils Absolute: 4.9 10*3/uL (ref 1.4–7.0)
Neutrophils: 61 %
Platelets: 216 10*3/uL (ref 150–450)
RBC: 4.44 x10E6/uL (ref 4.14–5.80)
RDW: 13.3 % (ref 11.6–15.4)
WBC: 8 10*3/uL (ref 3.4–10.8)

## 2023-12-15 LAB — HEPATIC FUNCTION PANEL
ALT: 13 IU/L (ref 0–44)
AST: 23 IU/L (ref 0–40)
Albumin: 4.1 g/dL (ref 3.7–4.7)
Alkaline Phosphatase: 100 IU/L (ref 44–121)
Bilirubin Total: 0.6 mg/dL (ref 0.0–1.2)
Bilirubin, Direct: 0.23 mg/dL (ref 0.00–0.40)
Total Protein: 6.9 g/dL (ref 6.0–8.5)

## 2023-12-16 ENCOUNTER — Ambulatory Visit (INDEPENDENT_AMBULATORY_CARE_PROVIDER_SITE_OTHER): Payer: Medicare HMO

## 2023-12-16 VITALS — Ht 68.0 in | Wt 115.0 lb

## 2023-12-16 DIAGNOSIS — Z0000 Encounter for general adult medical examination without abnormal findings: Secondary | ICD-10-CM

## 2023-12-16 NOTE — Patient Instructions (Signed)
 Jason Spence , Thank you for taking time to come for your Medicare Wellness Visit. I appreciate your ongoing commitment to your health goals. Please review the following plan we discussed and let me know if I can assist you in the future.   Referrals/Orders/Follow-Ups/Clinician Recommendations:  Next Medicare Annual Wellness Visit:   December 21, 2024 at 10:00 am telephone visit.      This is a list of the screening recommended for you and due dates:  Health Maintenance  Topic Date Due   Zoster (Shingles) Vaccine (1 of 2) 01/17/1959   COVID-19 Vaccine (3 - Moderna risk series) 12/27/2019   DTaP/Tdap/Td vaccine (2 - Tdap) 10/18/2021   Medicare Annual Wellness Visit  12/15/2024   Pneumonia Vaccine  Completed   Flu Shot  Completed   HPV Vaccine  Aged Out    Advanced directives: (Provided) Advance directive discussed with you today. I have provided a copy for you to complete at home and have notarized. Once this is complete, please bring a copy in to our office so we can scan it into your chart.   Next Medicare Annual Wellness Visit scheduled for next year: yes  Understanding Your Risk for Falls Millions of people have serious injuries from falls each year. It is important to understand your risk of falling. Talk with your health care provider about your risk and what you can do to lower it. If you do have a serious fall, make sure to tell your provider. Falling once raises your risk of falling again. How can falls affect me? Serious injuries from falls are common. These include: Broken bones, such as hip fractures. Head injuries, such as traumatic brain injuries (TBI) or concussions. A fear of falling can cause you to avoid activities and stay at home. This can make your muscles weaker and raise your risk for a fall. What can increase my risk? There are a number of risk factors that increase your risk for falling. The more risk factors you have, the higher your risk of falling. Serious  injuries from a fall happen most often to people who are older than 84 years old. Teenagers and young adults ages 40-29 are also at higher risk. Common risk factors include: Weakness in the lower body. Being generally weak or confused due to long-term (chronic) illness. Dizziness or balance problems. Poor vision. Medicines that cause dizziness or drowsiness. These may include: Medicines for your blood pressure, heart, anxiety, insomnia, or swelling (edema). Pain medicines. Muscle relaxants. Other risk factors include: Drinking alcohol. Having had a fall in the past. Having foot pain or wearing improper footwear. Working at a dangerous job. Having any of the following in your home: Tripping hazards, such as floor clutter or loose rugs. Poor lighting. Pets. Having dementia or memory loss. What actions can I take to lower my risk of falling?     Physical activity Stay physically fit. Do strength and balance exercises. Consider taking a regular class to build strength and balance. Yoga and tai chi are good options. Vision Have your eyes checked every year and your prescription for glasses or contacts updated as needed. Shoes and walking aids Wear non-skid shoes. Wear shoes that have rubber soles and low heels. Do not wear high heels. Do not walk around the house in socks or slippers. Use a cane or [place] as told by your provider. Home safety Attach secure railings on both sides of your stairs. Install grab bars for your bathtub, shower, and toilet. Use a non-skid  mat in your bathtub or shower. Attach bath mats securely with double-sided, non-slip rug tape. Use good lighting in all rooms. Keep a flashlight near your bed. Make sure there is a clear path from your bed to the bathroom. Use night-lights. Do not use throw rugs. Make sure all carpeting is taped or tacked down securely. Remove all clutter from walkways and stairways, including extension cords. Repair uneven or broken  steps and floors. Avoid walking on icy or slippery surfaces. Walk on the grass instead of on icy or slick sidewalks. Use ice melter to get rid of ice on walkways in the winter. Use a cordless phone. Questions to ask your health care provider Can you help me check my risk for a fall? Do any of my medicines make me more likely to fall? Should I take a vitamin D supplement? What exercises can I do to improve my strength and balance? Should I make an appointment to have my vision checked? Do I need a bone density test to check for weak bones (osteoporosis)? Would it help to use a cane or a [place]? Where to find more information Centers for Disease Control and Prevention, STEADI: TonerPromos.no Community-Based Fall Prevention Programs: TonerPromos.no [employer] on Aging: BaseRingTones.pl Contact a health care provider if: You fall at home. You are afraid of falling at home. You feel weak, drowsy, or dizzy. This information is not intended to replace advice given to you by your health care provider. Make sure you discuss any questions you have with your health care provider. Document Revised: 06/07/2022 Document Reviewed: 06/07/2022 Elsevier Patient Education  2024 [employer].

## 2023-12-16 NOTE — Progress Notes (Signed)
 Please attest and cosign this visit due to patients primary care provider not being in the office at the time the visit was completed.  Because this visit was a virtual/telehealth visit,  certain criteria was not obtained, such a blood pressure, CBG if applicable, and timed get up and go. Any medications not marked as "taking" were not mentioned during the medication reconciliation part of the visit. Any vitals not documented were not able to be obtained due to this being a telehealth visit or patient was unable to self-report a recent blood pressure reading due to a lack of equipment at home via telehealth. Vitals that have been documented are verbally provided by the patient.   Subjective:   Jason Spence is a 83 y.o. who presents for a Medicare Wellness preventive visit.  Visit Complete: Virtual I connected with  Jason Spence on 12/16/23 by a audio enabled telemedicine application and verified that I am speaking with the correct person using two identifiers.  Patient Location: Home  Provider Location: Home Office  I discussed the limitations of evaluation and management by telemedicine. The patient expressed understanding and agreed to proceed.  Vital Signs: Because this visit was a virtual/telehealth visit, some criteria may be missing or patient reported. Any vitals not documented were not able to be obtained and vitals that have been documented are patient reported.  VideoDeclined- This patient declined Librarian, academic. Therefore the visit was completed with audio only.  AWV Questionnaire: No: Patient Medicare AWV questionnaire was not completed prior to this visit.  Cardiac Risk Factors include: advanced age (>29men, >62 women);dyslipidemia;hypertension;male gender;Other (see comment), Risk factor comments: lung disease     Objective:    Today's Vitals   12/16/23 0941  Weight: 115 lb (52.2 kg)  Height: 5\' 8"  (1.727 m)   Body mass index  is 17.49 kg/m.     11/15/2023    4:55 PM 11/14/2023    8:27 PM 08/24/2022    3:32 PM 08/05/2022    6:58 AM 09/19/2021   12:00 PM 08/18/2021    3:18 PM 02/27/2018   11:45 AM  Advanced Directives  Does Patient Have a Medical Advance Directive?  No No No No Yes Yes  Type of Careers adviser;Living will Healthcare Power of [place];Living will  Copy of Healthcare Power of Attorney in Chart?      No - copy requested No - copy requested  Would patient like information on creating a medical advance directive? No - Patient declined  No - Patient declined No - Patient declined No - Patient declined      Current Medications (verified) Outpatient Encounter Medications as of 12/16/2023  Medication Sig   acidophilus (RISAQUAD) CAPS capsule Take 2 capsules by mouth 3 (three) times daily.   albuterol (VENTOLIN HFA) 108 (90 Base) MCG/ACT inhaler Inhale 2 puffs into the lungs every 4 (four) hours as needed for wheezing.   Ascorbic Acid (VITAMIN C) 1000 MG tablet Take 1,000 mg by mouth daily.   aspirin 81 MG tablet Take 1 tablet (81 mg total) by mouth daily.   budesonide-formoterol (BREYNA) 160-4.5 MCG/ACT inhaler INHALE 2 PUFFS BY MOUTH TWICE DAILY -  RINSE  MOUTH  WITH  WATER  AFTER  EACH  USE   cetirizine (ZYRTEC ALLERGY) 10 MG tablet Take 1 tablet (10 mg total) by mouth daily.   Cholecalciferol (VITAMIN D3 PO) Take 1 tablet by mouth daily.   ferrous sulfate  325 (65 FE) MG tablet Take 1 tablet (325 mg total) by mouth daily with breakfast.   fish oil-omega-3 fatty acids 1000 MG capsule Take 1 g by mouth daily.    ketoconazole (NIZORAL) 2 % cream APPLY CREAM TOPICALLY TO AFFECTED AREA TWICE DAILY AS NEEDED   levofloxacin (LEVAQUIN) 750 MG tablet Take 1 tablet (750 mg total) by mouth daily.   meclizine (ANTIVERT) 25 MG tablet Take 1 tablet (25 mg total) by mouth 3 (three) times daily as needed for dizziness.   metoprolol tartrate (LOPRESSOR) 25 MG tablet Take 1 tablet (25  mg total) by mouth 2 (two) times daily.   Multiple Vitamins-Minerals (PRESERVISION AREDS 2 PO) Take 1 capsule by mouth 2 (two) times daily.   pantoprazole (PROTONIX) 40 MG tablet TAKE 1 TABLET BY MOUTH ONCE DAILY 30 MINUTES BEFORE BREAKFAST   pravastatin (PRAVACHOL) 40 MG tablet Take 1 tablet by mouth once daily   promethazine-dextromethorphan (PROMETHAZINE-DM) 6.25-15 MG/5ML syrup Take 5 mLs by mouth 4 (four) times daily as needed for cough.   ramipril (ALTACE) 10 MG capsule Take 1 capsule by mouth once daily   No facility-administered encounter medications on file as of 12/16/2023.    Allergies (verified) Patient has no known allergies.   History: Past Medical History:  Diagnosis Date   Anal stricture 10/18/2006   Arthritis    Hypercholesteremia    Hypertension    Myocardial infarct [facility])    Past Surgical History:  Procedure Laterality Date   BALLOON DILATION N/A 01/05/2013   Procedure: BALLOON DILATION;  Surgeon: Malissa Hippo, MD;  [facility];  Service: Endoscopy;  Laterality: N/A;   BIOPSY  02/27/2018   Procedure: BIOPSY;  Surgeon: Malissa Hippo, MD;  [facility];  Service: Endoscopy;;  esophagus   BIOPSY  08/05/2022   Procedure: BIOPSY;  Surgeon: Dolores Frame, MD;  [facility];  Service: Gastroenterology;;   COLONOSCOPY WITH ESOPHAGOGASTRODUODENOSCOPY (EGD) N/A 01/05/2013   Procedure: COLONOSCOPY WITH ESOPHAGOGASTRODUODENOSCOPY (EGD);  Surgeon: Malissa Hippo, MD;  [facility];  Service: Endoscopy;  Laterality: N/A;  100   COLONOSCOPY WITH PROPOFOL N/A 08/05/2022   Procedure: COLONOSCOPY WITH PROPOFOL;  Surgeon: Dolores Frame, MD;  [facility];  Service: Gastroenterology;  Laterality: N/A;  1030 ASA 3   CORONARY ARTERY BYPASS GRAFT     ESOPHAGEAL DILATION N/A 02/27/2018   Procedure: ESOPHAGEAL DILATION;  Surgeon: Malissa Hippo, MD;  [facility];  Service: Endoscopy;   Laterality: N/A;   ESOPHAGOGASTRODUODENOSCOPY N/A 02/27/2018   Procedure: ESOPHAGOGASTRODUODENOSCOPY (EGD);  Surgeon: Malissa Hippo, MD;  [facility];  Service: Endoscopy;  Laterality: N/A;  12:25   ESOPHAGOGASTRODUODENOSCOPY (EGD) WITH PROPOFOL N/A 08/05/2022   Procedure: ESOPHAGOGASTRODUODENOSCOPY (EGD) WITH PROPOFOL;  Surgeon: Dolores Frame, MD;  [facility];  Service: Gastroenterology;  Laterality: N/A;   GIVENS CAPSULE STUDY N/A 08/24/2022   Procedure: GIVENS CAPSULE STUDY;  Surgeon: Dolores Frame, MD;  [facility];  Service: Gastroenterology;  Laterality: N/A;  730 am   HERNIA REPAIR     POLYPECTOMY  08/05/2022   Procedure: POLYPECTOMY;  Surgeon: Dolores Frame, MD;  [facility];  Service: Gastroenterology;;   Family History  Problem Relation Age of Onset   Hypertension Sister    Social History   Socioeconomic History   Marital status: Widowed    Spouse name: Not on file   Number of children: Not  on file   Years of education: Not on file   Highest education level: Not on file  Occupational History   Not on file  Tobacco Use   Smoking status: Former    Current packs/day: 0.00    Types: Cigarettes    Quit date: 08/04/1995    Years since quitting: 28.3    Passive exposure: Past   Smokeless tobacco: Never  Vaping Use   Vaping status: Never Used  Substance and Sexual Activity   Alcohol use: Not Currently    Comment: rare   Drug use: Never   Sexual activity: Yes    Birth control/protection: None  Other Topics Concern   Not on file  Social History Narrative   ** Merged History Encounter **       12/16/23 lives with son and daughter in [place]. He lives in the basement.    Social Drivers of Corporate investment banker Strain: Low Risk  (12/16/2023)   Overall Financial Resource Strain (CARDIA)    Difficulty of Paying Living Expenses: Not hard at all  Food Insecurity: No Food Insecurity  (12/16/2023)   Hunger Vital Sign    Worried About Running Out of Food in the Last Year: Never true    Ran Out of Food in the Last Year: Never true  Transportation Needs: No Transportation Needs (12/16/2023)   PRAPARE - Administrator, Civil Service (Medical): No    Lack of Transportation (Non-Medical): No  Physical Activity: Sufficiently Active (12/16/2023)   Exercise Vital Sign    Days of Exercise per Week: 7 days    Minutes of Exercise per Session: 30 min  Stress: No Stress Concern Present (12/16/2023)   [employer] of Occupational Health - Occupational Stress Questionnaire    Feeling of Stress : Not at all  Social Connections: Moderately Isolated (12/16/2023)   Social Connection and Isolation Panel [NHANES]    Frequency of Communication with Friends and Family: More than three times a week    Frequency of Social Gatherings with Friends and Family: More than three times a week    Attends Religious Services: More than 4 times per year    Active Member of [employer] or Organizations: No    Attends Banker Meetings: Never    Marital Status: Widowed    Tobacco Counseling Counseling given: Yes    Clinical Intake:  Pre-visit preparation completed: Yes  Pain : No/denies pain     BMI - recorded: 17.49 Nutritional Status: BMI <19  Underweight Nutritional Risks: None Diabetes: No  How often do you need to have someone help you when you read instructions, pamphlets, or other written materials from your doctor or pharmacy?: 1 - Never  Interpreter Needed?: No  Information entered by :: Maryjean Ka CMA   Activities of Daily Living     12/16/2023    9:42 AM 11/15/2023   12:56 AM  In your present state of health, do you have any difficulty performing the following activities:  Hearing? 1   Comment pt has had a hearing test. told he needs hearing aids but hasn't gotten them   Vision? 1   Comment macular degeneration. Sees Dr. Maeola Sarah w/ [facility]  Specialist   Difficulty concentrating or making decisions? 0   Walking or climbing stairs? 0   Dressing or bathing? 0   Doing errands, shopping? 0 0  Preparing Food and eating ? N   Using the Toilet? N   In the past  six months, have you accidently leaked urine? N   Do you have problems with loss of bowel control? N   Managing your Medications? N   Managing your Finances? N   Housekeeping or managing your Housekeeping? N     Patient Care Team: Babs Sciara, MD as PCP - General (Family Medicine) Mallipeddi, Orion Modest, MD as PCP - Cardiology (Cardiology) Ferman Hamming, DPM as Consulting Physician (Podiatry) Mallipeddi, Orion Modest, MD as Consulting Physician (Cardiology) Maeola Sarah, MD as Referring Physician (Ophthalmology) Nita Sells, MD (Dermatology) Raquel James, NP as Nurse Practitioner (Gastroenterology) Marcelene Butte, MD as Referring Physician (Internal Medicine) Aubery Lapping (Allergy and Immunology) Marguerita Merles, Reuel Boom, MD as Consulting Physician (Gastroenterology) Tarry Kos, MD as Attending Physician (Orthopedic Surgery)  Indicate any recent Medical Services you may have received from other than [facility] providers in the past year (date may be approximate).     Assessment:   This is a routine wellness examination for [facility].  Hearing/Vision screen Hearing Screening - Comments:: Patient complains of difficulty with hearing. Has had a hearing test and was told he needed hearing aids but has not gotten them.   Vision Screening - Comments:: Patient has difficulty seeing due to macular degeneration. Sees Dr. Maeola Sarah w/ [facility] Specialist and is up to date with those exams. No longer drives due to vision difficulties.    Goals Addressed   None    Depression Screen     12/16/2023    9:55 AM 11/09/2023    4:16 PM 07/27/2023    1:39 PM 02/24/2023   10:54 AM 10/12/2022   10:26 AM 08/24/2022    3:30 PM 05/03/2022    3:22 PM  PHQ 2/9 Scores   PHQ - 2 Score 0 0 0 2 4 0 0  PHQ- 9 Score 0 0 2 4 10  0     Fall Risk     12/16/2023    9:50 AM 11/09/2023    4:16 PM 07/27/2023    1:39 PM 02/24/2023   10:54 AM 10/12/2022   10:27 AM  Fall Risk   Falls in the past year? 0 0 0 1 1  Number falls in past yr: 0  0 1 0  Injury with Fall? 0 0 0 0 0  Risk for fall due to : Impaired mobility;Impaired vision;Impaired balance/gait      Risk for fall due to: Comment patient has dizzy spells especially when he first gets up in the morning. Has a [place]/cane if he needs it and uses the [place] during the night should he need to get up and use the restroom.      Follow up Falls prevention discussed;Falls evaluation completed        MEDICARE RISK AT HOME:  Medicare Risk at Home Any stairs in or around the home?: Yes If so, are there any without handrails?: No Home free of loose throw rugs in walkways, pet beds, electrical cords, etc?: Yes Adequate lighting in your home to reduce risk of falls?: Yes Life alert?: No Use of a cane, [place] or w/c?: Yes Grab bars in the bathroom?: Yes Shower chair or bench in shower?: Yes Elevated toilet seat or a handicapped toilet?: No  TIMED UP AND GO:  Was the test performed?  No  Cognitive Function: 6CIT completed        12/16/2023    9:55 AM 08/24/2022    3:35 PM  6CIT Screen  What Year? 0 points 0 points  What  month? 0 points 0 points  What time? 0 points 0 points  Count back from 20 0 points 0 points  Months in reverse 0 points 0 points  Repeat phrase 0 points 2 points  Total Score 0 points 2 points    Immunizations Immunization History  Administered Date(s) Administered   Fluad Quad(high Dose 65+) 08/08/2020   Fluad Trivalent(High Dose 65+) 07/27/2023   Influenza,inj,Quad PF,6+ Mos 07/12/2013, 07/15/2014, 07/01/2015, 08/03/2017, 06/20/2018, 08/08/2019   Influenza-Unspecified 07/18/2012, 08/19/2016   Moderna Sars-Covid-2 Vaccination 10/25/2019, 11/29/2019   PNEUMOCOCCAL CONJUGATE-20  06/15/2022   Pneumococcal Conjugate-13 07/15/2014   Pneumococcal Polysaccharide-23 07/18/2005   Td 10/19/2011   Zoster, Live 10/18/2005    Screening Tests Health Maintenance  Topic Date Due   Zoster Vaccines- Shingrix (1 of 2) 01/17/1959   COVID-19 Vaccine (3 - Moderna risk series) 12/27/2019   DTaP/Tdap/Td (2 - Tdap) 10/18/2021   Medicare Annual Wellness (AWV)  12/15/2024   Pneumonia Vaccine 19+ Years old  Completed   INFLUENZA VACCINE  Completed   HPV VACCINES  Aged Out    Health Maintenance  Health Maintenance Due  Topic Date Due   Zoster Vaccines- Shingrix (1 of 2) 01/17/1959   COVID-19 Vaccine (3 - Moderna risk series) 12/27/2019   DTaP/Tdap/Td (2 - Tdap) 10/18/2021   Health Maintenance Items Addressed: Patient advised of needed vaccines.   Additional Screening:  Vision Screening: Recommended annual ophthalmology exams for early detection of glaucoma and other disorders of the eye.  Dental Screening: Recommended annual dental exams for proper oral hygiene  Community Resource Referral / Chronic Care Management: CRR required this visit?  No   CCM required this visit?  No     Plan:     I have personally reviewed and noted the following in the patient's chart:   Medical and social history Use of alcohol, tobacco or illicit drugs  Current medications and supplements including opioid prescriptions. Patient is not currently taking opioid prescriptions. Functional ability and status Nutritional status Physical activity Advanced directives List of other physicians Hospitalizations, surgeries, and ER visits in previous 12 months Vitals Screenings to include cognitive, depression, and falls Referrals and appointments  In addition, I have reviewed and discussed with patient certain preventive protocols, quality metrics, and best practice recommendations. A written personalized care plan for preventive services as well as general preventive health recommendations  were provided to patient.     Jordan Hawks Alyanah Elliott, CMA   12/16/2023   After Visit Summary: (MyChart) Due to this being a telephonic visit, the after visit summary with patients personalized plan was offered to patient via MyChart   Notes: Nothing significant to report at this time.

## 2023-12-19 ENCOUNTER — Other Ambulatory Visit: Payer: Self-pay | Admitting: Family Medicine

## 2023-12-22 ENCOUNTER — Ambulatory Visit: Payer: Medicare HMO | Admitting: Family Medicine

## 2023-12-22 VITALS — BP 142/66 | HR 55 | Temp 98.6°F | Ht 68.0 in | Wt 116.6 lb

## 2023-12-22 DIAGNOSIS — J8417 Interstitial lung disease with progressive fibrotic phenotype in diseases classified elsewhere: Secondary | ICD-10-CM | POA: Diagnosis not present

## 2023-12-22 DIAGNOSIS — R634 Abnormal weight loss: Secondary | ICD-10-CM | POA: Diagnosis not present

## 2023-12-22 DIAGNOSIS — Z8701 Personal history of pneumonia (recurrent): Secondary | ICD-10-CM | POA: Diagnosis not present

## 2023-12-22 NOTE — Progress Notes (Signed)
   Subjective:    Patient ID: Jason Spence, male    DOB: 1940/12/11, 83 y.o.   MRN: [id number]  History of Present Illness         Patient with history of lung infection Previous pulmonary interstitial lung disease Will see [facility] specialist coming up Patient overall feels like he is doing much better Energy level improving Appetite fair Weight is still down. His dad had similar issues with weight dropping as he got older Recent lab work looked relatively reassuring     Review of Systems     Objective:    Physical Exam        General-in no acute distress Eyes-no discharge Lungs-respiratory rate normal, CTA CV-no murmurs,RRR Extremities skin warm dry no edema Neuro grossly normal Behavior normal, alert  Blood pressure for age look good       Assessment & Plan:  Assessment and Plan     Chest x-ray is visually reviewed does look improved await radiology input  1. Interstitial lung disease with progressive fibrotic phenotype in diseases classified elsewhere [facility]) (Primary) Patient will be seeing specialists at [facility] for follow-up  2. Weight loss His weight is relatively stable yet nonetheless it is lower than what it used to be but I doubt that he has underlying cancer I think this is more related to increased lung work as well as mild sarcopenia.  Continue dietary measures as best as possible to keep weight from going further down follow-up in 4 months  3. History of community acquired pneumonia Chest x-ray followed up does not show any pneumonia does appear to be improving he is to follow-up if problems sooner

## 2023-12-24 ENCOUNTER — Other Ambulatory Visit: Payer: Self-pay | Admitting: Family Medicine

## 2024-01-09 ENCOUNTER — Other Ambulatory Visit: Payer: Self-pay | Admitting: Family Medicine

## 2024-01-12 DIAGNOSIS — Z951 Presence of aortocoronary bypass graft: Secondary | ICD-10-CM | POA: Diagnosis not present

## 2024-01-12 DIAGNOSIS — Z8719 Personal history of other diseases of the digestive system: Secondary | ICD-10-CM | POA: Diagnosis not present

## 2024-01-12 DIAGNOSIS — Z9889 Other specified postprocedural states: Secondary | ICD-10-CM | POA: Diagnosis not present

## 2024-01-12 DIAGNOSIS — J84112 Idiopathic pulmonary fibrosis: Secondary | ICD-10-CM | POA: Diagnosis not present

## 2024-01-12 DIAGNOSIS — J8410 Pulmonary fibrosis, unspecified: Secondary | ICD-10-CM | POA: Diagnosis not present

## 2024-01-12 DIAGNOSIS — I2510 Atherosclerotic heart disease of native coronary artery without angina pectoris: Secondary | ICD-10-CM | POA: Diagnosis not present

## 2024-01-12 DIAGNOSIS — Z681 Body mass index (BMI) 19 or less, adult: Secondary | ICD-10-CM | POA: Diagnosis not present

## 2024-01-12 DIAGNOSIS — K219 Gastro-esophageal reflux disease without esophagitis: Secondary | ICD-10-CM | POA: Diagnosis not present

## 2024-01-12 DIAGNOSIS — Z87891 Personal history of nicotine dependence: Secondary | ICD-10-CM | POA: Diagnosis not present

## 2024-01-12 DIAGNOSIS — Z7982 Long term (current) use of aspirin: Secondary | ICD-10-CM | POA: Diagnosis not present

## 2024-01-12 DIAGNOSIS — R634 Abnormal weight loss: Secondary | ICD-10-CM | POA: Diagnosis not present

## 2024-01-12 DIAGNOSIS — H3530 Unspecified macular degeneration: Secondary | ICD-10-CM | POA: Diagnosis not present

## 2024-01-12 DIAGNOSIS — Z8701 Personal history of pneumonia (recurrent): Secondary | ICD-10-CM | POA: Diagnosis not present

## 2024-01-12 DIAGNOSIS — R911 Solitary pulmonary nodule: Secondary | ICD-10-CM | POA: Diagnosis not present

## 2024-01-12 DIAGNOSIS — Z79899 Other long term (current) drug therapy: Secondary | ICD-10-CM | POA: Diagnosis not present

## 2024-01-12 DIAGNOSIS — I252 Old myocardial infarction: Secondary | ICD-10-CM | POA: Diagnosis not present

## 2024-01-16 ENCOUNTER — Other Ambulatory Visit: Payer: Self-pay

## 2024-01-16 MED ORDER — RAMIPRIL 10 MG PO CAPS: 10.0000 mg | ORAL_CAPSULE | Freq: Every day | ORAL | 0 refills | Status: DC

## 2024-01-23 ENCOUNTER — Ambulatory Visit: Payer: Medicare HMO | Attending: Internal Medicine | Admitting: Internal Medicine

## 2024-01-23 ENCOUNTER — Encounter: Payer: Self-pay | Admitting: Internal Medicine

## 2024-01-23 VITALS — BP 138/74 | HR 53 | Ht 67.0 in | Wt 114.4 lb

## 2024-01-23 DIAGNOSIS — I491 Atrial premature depolarization: Secondary | ICD-10-CM | POA: Diagnosis not present

## 2024-01-23 MED ORDER — DILTIAZEM HCL 30 MG PO TABS
30.0000 mg | ORAL_TABLET | Freq: Two times a day (BID) | ORAL | 5 refills | Status: DC
Start: 2024-01-23 — End: 2024-07-11

## 2024-01-23 NOTE — Progress Notes (Signed)
 Cardiology Office Note  Date: 01/23/2024   ID: Jason Spence, DOB 22-Jun-1941, MRN [id number]  PCP:  Babs Sciara, MD  Cardiologist:  Marjo Bicker, MD Electrophysiologist:  None   History of Present Illness: Jason Spence is a 83 y.o. male known to have CAD s/p CABG in 1996, HTN, HLD, pulmonary fibrosis not on home O2 is here for follow-up visit of palpitations.  Event monitor from December 2024 showed a 16.8% PAC burden, symptomatic and 8 runs of NSVT, longest lasting 6 beats.  Average HR was 59 bpm.  He is currently on metoprolol tartrate 25 mg twice daily.  He reported feeling extremely tired after starting metoprolol.  His iron panel including ferritin was normal last year in 2024.  He does not have any other symptoms of angina, DOE, syncope, leg swelling.  Palpitations on metoprolol are occasional.  He has constipation issues too.   Past Medical History:  Diagnosis Date   Anal stricture 10/18/2006   Arthritis    Hypercholesteremia    Hypertension    Myocardial infarct [facility])     Past Surgical History:  Procedure Laterality Date   BALLOON DILATION N/A 01/05/2013   Procedure: BALLOON DILATION;  Surgeon: Malissa Hippo, MD;  [facility];  Service: Endoscopy;  Laterality: N/A;   BIOPSY  02/27/2018   Procedure: BIOPSY;  Surgeon: Malissa Hippo, MD;  [facility];  Service: Endoscopy;;  esophagus   BIOPSY  08/05/2022   Procedure: BIOPSY;  Surgeon: Dolores Frame, MD;  [facility];  Service: Gastroenterology;;   COLONOSCOPY WITH ESOPHAGOGASTRODUODENOSCOPY (EGD) N/A 01/05/2013   Procedure: COLONOSCOPY WITH ESOPHAGOGASTRODUODENOSCOPY (EGD);  Surgeon: Malissa Hippo, MD;  [facility];  Service: Endoscopy;  Laterality: N/A;  100   COLONOSCOPY WITH PROPOFOL N/A 08/05/2022   Procedure: COLONOSCOPY WITH PROPOFOL;  Surgeon: Dolores Frame, MD;  [facility];  Service: Gastroenterology;   Laterality: N/A;  1030 ASA 3   CORONARY ARTERY BYPASS GRAFT     ESOPHAGEAL DILATION N/A 02/27/2018   Procedure: ESOPHAGEAL DILATION;  Surgeon: Malissa Hippo, MD;  [facility];  Service: Endoscopy;  Laterality: N/A;   ESOPHAGOGASTRODUODENOSCOPY N/A 02/27/2018   Procedure: ESOPHAGOGASTRODUODENOSCOPY (EGD);  Surgeon: Malissa Hippo, MD;  [facility];  Service: Endoscopy;  Laterality: N/A;  12:25   ESOPHAGOGASTRODUODENOSCOPY (EGD) WITH PROPOFOL N/A 08/05/2022   Procedure: ESOPHAGOGASTRODUODENOSCOPY (EGD) WITH PROPOFOL;  Surgeon: Dolores Frame, MD;  [facility];  Service: Gastroenterology;  Laterality: N/A;   GIVENS CAPSULE STUDY N/A 08/24/2022   Procedure: GIVENS CAPSULE STUDY;  Surgeon: Dolores Frame, MD;  [facility];  Service: Gastroenterology;  Laterality: N/A;  730 am   HERNIA REPAIR     POLYPECTOMY  08/05/2022   Procedure: POLYPECTOMY;  Surgeon: Marguerita Merles, Reuel Boom, MD;  [facility];  Service: Gastroenterology;;    Current Outpatient Medications  Medication Sig Dispense Refill   acidophilus (RISAQUAD) CAPS capsule Take 2 capsules by mouth 3 (three) times daily. 180 capsule 0   albuterol (VENTOLIN HFA) 108 (90 Base) MCG/ACT inhaler Inhale 2 puffs into the lungs every 4 (four) hours as needed for wheezing. 1 each 2   Ascorbic Acid (VITAMIN C) 1000 MG tablet Take 1,000 mg by mouth daily.     aspirin 81 MG tablet Take 1 tablet (81 mg total) by mouth daily. 30 tablet    budesonide-formoterol (BREYNA) 160-4.5 MCG/ACT inhaler  INHALE 2 PUFFS BY MOUTH TWICE DAILY -  RINSE  MOUTH  WITH  WATER  AFTER  EACH  USE 11 g 5   cetirizine (ZYRTEC ALLERGY) 10 MG tablet Take 1 tablet (10 mg total) by mouth daily. 30 tablet 2   Cholecalciferol (VITAMIN D3 PO) Take 1 tablet by mouth daily.     ferrous sulfate 325 (65 FE) MG tablet Take 1 tablet (325 mg total) by mouth daily with breakfast. 90 tablet 3   fish oil-omega-3  fatty acids 1000 MG capsule Take 1 g by mouth daily.      ketoconazole (NIZORAL) 2 % cream APPLY CREAM TOPICALLY TO AFFECTED AREA TWICE DAILY AS NEEDED 60 g 0   meclizine (ANTIVERT) 25 MG tablet Take 1 tablet (25 mg total) by mouth 3 (three) times daily as needed for dizziness. 21 tablet 0   metoprolol tartrate (LOPRESSOR) 25 MG tablet Take 1 tablet (25 mg total) by mouth 2 (two) times daily. 60 tablet 3   Multiple Vitamins-Minerals (PRESERVISION AREDS 2 PO) Take 1 capsule by mouth 2 (two) times daily.     pantoprazole (PROTONIX) 40 MG tablet TAKE 1 TABLET BY MOUTH ONCE DAILY 30 MINUTES BEFORE BREAKFAST 90 tablet 0   pravastatin (PRAVACHOL) 40 MG tablet Take 1 tablet by mouth once daily 90 tablet 0   promethazine-dextromethorphan (PROMETHAZINE-DM) 6.25-15 MG/5ML syrup Take 5 mLs by mouth 4 (four) times daily as needed for cough. 118 mL 0   ramipril (ALTACE) 10 MG capsule Take 1 capsule (10 mg total) by mouth daily. 90 capsule 0   No current facility-administered medications for this visit.   Allergies:  Patient has no known allergies.   Social History: The patient  reports that he quit smoking about 28 years ago. His smoking use included cigarettes. He has been exposed to tobacco smoke. He has never used smokeless tobacco. He reports that he does not currently use alcohol. He reports that he does not use drugs.   Family History: The patient's family history includes Hypertension in his sister.   ROS:  Please see the history of present illness. Otherwise, complete review of systems is positive for none  All other systems are reviewed and negative.   Physical Exam: VS:  BP 138/74   Pulse (!) 53   Ht 5\' 7"  (1.702 m)   Wt 114 lb 6.4 oz (51.9 kg)   SpO2 99%   BMI 17.92 kg/m , BMI Body mass index is 17.92 kg/m.  Wt Readings from Last 3 Encounters:  01/23/24 114 lb 6.4 oz (51.9 kg)  12/22/23 116 lb 9.6 oz (52.9 kg)  12/16/23 115 lb (52.2 kg)    General: Patient appears comfortable at  rest. HEENT: Conjunctiva and lids normal, oropharynx clear with moist mucosa. Neck: Supple, no elevated JVP or carotid bruits, no thyromegaly. Lungs: Clear to auscultation, nonlabored breathing at rest. Cardiac: Regular rate and rhythm, no S3 or significant systolic murmur, no pericardial rub. Abdomen: Soft, nontender, no hepatomegaly, bowel sounds present, no guarding or rebound. Extremities: No pitting edema, distal pulses 2+. Skin: Warm and dry. Musculoskeletal: No kyphosis. Neuropsychiatric: Alert and oriented x3, affect grossly appropriate.  Recent Labwork: 11/14/2023: B Natriuretic Peptide 383.0; Magnesium 2.1 12/14/2023: ALT 13; AST 23; BUN 14; Creatinine, Ser 0.84; Hemoglobin 12.7; Platelets 216; Potassium 4.5; Sodium 140     Component Value Date/Time   CHOL 111 07/22/2023 0847   TRIG 88 07/22/2023 0847   HDL 36 (L) 07/22/2023 0847   CHOLHDL 3.1 07/22/2023 0847  CHOLHDL 3.2 07/08/2014 0925   VLDL 31 07/08/2014 0925   LDLCALC 58 07/22/2023 0847     Assessment and Plan:   Palpitations secondary to frequent PAC burden, 16.8%: Symptoms improved on metoprolol tartrate 25 mg twice daily.  Due to severe fatigue and average HR being 59 bpm on event monitor, will switch metoprolol to diltiazem 30 mg every 12 hours.  Instructed patient that we need to stop diltiazem if he continues to feel tired despite normal iron levels.  He will follow with PCP to get his iron and ferritin checked.  His ferritin has been chronically low.  CAD s/p CABG in 1996: No angina or DOE.  Continue cardioprotective medications, aspirin 81 mg once daily, pravastatin 40 mg at bedtime.  HTN, controlled: Continue current antihypertensives, ramipril 10 mg once daily.  Follows up with PCP.  HLD, at goal: Continue pravastatin 40 mg nightly.  Goal LDL less than 55.  Pulmonary fibrosis: Follows up with [facility].   Medication Adjustments/Labs and Tests Ordered: Current medicines are reviewed at length with the  patient today.  Concerns regarding medicines are outlined above.    Disposition:  Follow up  1 year  Signed Nilesh Stegall Verne Spurr, MD, 01/23/2024 10:09 AM    [facility] at [facility] [address] [place], [place], [place] [address]

## 2024-01-23 NOTE — Patient Instructions (Addendum)
 Medication Instructions:  Your physician has recommended you make the following change in your medication:  Stop Metoprolol Start Diltiazem 30 mg every 12 hours Continue taking all other medications as prescribed  Labwork: None  Testing/Procedures: None  Follow-Up: Your physician recommends that you schedule a follow-up appointment in: 1 year. You will receive a reminder call in about 8 months reminding you to schedule your appointment. If you don't receive this call, please contact our office.   Any Other Special Instructions Will Be Listed Below (If Applicable). Thank you for choosing [facility]!     If you need a refill on your cardiac medications before your next appointment, please call your pharmacy.

## 2024-01-25 ENCOUNTER — Ambulatory Visit: Payer: Medicare HMO | Admitting: Family Medicine

## 2024-02-06 ENCOUNTER — Telehealth: Payer: Self-pay

## 2024-02-06 MED ORDER — PRAVASTATIN SODIUM 40 MG PO TABS: 40.0000 mg | ORAL_TABLET | Freq: Every day | ORAL | 0 refills | Status: DC

## 2024-02-06 NOTE — Telephone Encounter (Signed)
 Prescription Request  02/06/2024  LOV: Visit date not found  What is the name of the medication or equipment? pravastatin  (PRAVACHOL ) 40 MG tablet   Have you contacted your pharmacy to request a refill? Yes   Which pharmacy would you like this sent to?   [facility]      Patient notified that their request is being sent to the clinical staff for review and that they should receive a response within 2 business days.   Please advise at Mobile [phone] (mobile)

## 2024-02-21 DIAGNOSIS — D225 Melanocytic nevi of trunk: Secondary | ICD-10-CM | POA: Diagnosis not present

## 2024-02-21 DIAGNOSIS — X32XXXD Exposure to sunlight, subsequent encounter: Secondary | ICD-10-CM | POA: Diagnosis not present

## 2024-02-21 DIAGNOSIS — L570 Actinic keratosis: Secondary | ICD-10-CM | POA: Diagnosis not present

## 2024-02-23 DIAGNOSIS — B351 Tinea unguium: Secondary | ICD-10-CM | POA: Diagnosis not present

## 2024-02-23 DIAGNOSIS — M79674 Pain in right toe(s): Secondary | ICD-10-CM | POA: Diagnosis not present

## 2024-02-27 DIAGNOSIS — H353134 Nonexudative age-related macular degeneration, bilateral, advanced atrophic with subfoveal involvement: Secondary | ICD-10-CM | POA: Diagnosis not present

## 2024-02-27 DIAGNOSIS — H43813 Vitreous degeneration, bilateral: Secondary | ICD-10-CM | POA: Diagnosis not present

## 2024-02-27 DIAGNOSIS — H35033 Hypertensive retinopathy, bilateral: Secondary | ICD-10-CM | POA: Diagnosis not present

## 2024-02-27 DIAGNOSIS — H353232 Exudative age-related macular degeneration, bilateral, with inactive choroidal neovascularization: Secondary | ICD-10-CM | POA: Diagnosis not present

## 2024-03-20 ENCOUNTER — Ambulatory Visit (INDEPENDENT_AMBULATORY_CARE_PROVIDER_SITE_OTHER): Admitting: Family Medicine

## 2024-03-20 ENCOUNTER — Ambulatory Visit: Payer: Self-pay

## 2024-03-20 VITALS — BP 136/58 | HR 54 | Temp 97.9°F | Ht 67.0 in | Wt 112.0 lb

## 2024-03-20 DIAGNOSIS — D649 Anemia, unspecified: Secondary | ICD-10-CM | POA: Diagnosis not present

## 2024-03-20 DIAGNOSIS — R1032 Left lower quadrant pain: Secondary | ICD-10-CM

## 2024-03-20 DIAGNOSIS — R634 Abnormal weight loss: Secondary | ICD-10-CM | POA: Diagnosis not present

## 2024-03-20 DIAGNOSIS — R42 Dizziness and giddiness: Secondary | ICD-10-CM | POA: Diagnosis not present

## 2024-03-20 NOTE — Telephone Encounter (Signed)
 Appt scheduled

## 2024-03-20 NOTE — Patient Instructions (Signed)
 Labs today.  Ordering CT scan.  Stop ramipril .  Follow up in 1-2 weeks with [name].

## 2024-03-20 NOTE — Telephone Encounter (Signed)
 Copied from CRM [id number]. Topic: Clinical - Red Word Triage >> Mar 20, 2024 11:20 AM Jason Spence wrote: Red Word that prompted transfer to Nurse Triage: lightheaded and dizzy bp 103/40 low for him  Chief Complaint: Low BP Symptoms: Lightheadedness, dizziness Frequency: Today Pertinent Negatives: Patient denies fever Disposition: [] ED /[] Urgent Care (no appt availability in office) / [x] Appointment(In office/virtual)/ []  [facility]/ [] Home Care/ [] Refused Recommended Disposition /[] [facility]/ []  Follow-up with PCP Additional Notes: Spoke to patient's daughter-in-law, Jason Spence, on behalf of the patient. Patient has been experiencing low BP today. Patient's BP was 113/48, while on the phone with this RN. Patient stated lightheadedness and dizziness started this morning and is worse when he stands up from sitting. Patient denied fainting/falling and fever. Advised patient to be seen within 4 hours, per protocol. No availability with PCP. Scheduled with alternate provider in office. Provided care advice and instructed patient to call back if symptoms worsen. Patient complied.   Reason for Disposition  [1] Systolic BP 90-110 AND [2] taking blood pressure medications AND [3] dizzy, lightheaded or weak  Answer Assessment - Initial Assessment Questions 1. BLOOD PRESSURE: "What is the blood pressure?" "Did you take at least two measurements 5 minutes apart?"     103/40 today, 113/48 on the phone with this RN 2. ONSET: "When did you take your blood pressure?"     Today 3. HOW: "How did you obtain the blood pressure?" (e.g., visiting nurse, automatic home BP monitor)     Automatic BP cuff at home 4. HISTORY: "Do you have a history of low blood pressure?" "What is your blood pressure normally?"     Typically runs in the 140s 5. MEDICINES: "Are you taking any medications for blood pressure?" If Yes, ask: "Have they been changed recently?"     Ramipril  and diltiazem   6. PULSE RATE: "Do  you know what your pulse rate is?"      65 7. OTHER SYMPTOMS: "Have you been sick recently?" "Have you had a recent injury?"     Lightheadedness and dizziness started this morning- states lightheadedness worsens when standing up, denied fainting/falls, denies fever  Protocols used: Blood Pressure - Low-A-AH

## 2024-03-21 ENCOUNTER — Other Ambulatory Visit: Payer: Self-pay | Admitting: Family Medicine

## 2024-03-21 DIAGNOSIS — R42 Dizziness and giddiness: Secondary | ICD-10-CM | POA: Insufficient documentation

## 2024-03-21 LAB — CBC

## 2024-03-21 NOTE — Assessment & Plan Note (Signed)
 Labs for further evaluation.  Discussed case with primary care physician who recommended CT abdomen and pelvis given unintentional weight loss and tenderness to palpation in the left lower quadrant.

## 2024-03-21 NOTE — Progress Notes (Signed)
 Subjective:  Patient ID: Jason Spence, male    DOB: [birth year]  Age: 83 y.o. MRN: [id number]  CC:   Chief Complaint  Patient presents with   Dizziness    Since Saturday , home bp reading was 103/40 Discuss diltiazem  given by cardiology    HPI:  83 year old male presents for evaluation of the above.  Patient reports that his symptoms started on Saturday.  He reports lightheadedness/dizziness.  Feels unsteady on his feet.  No presyncope.  Blood pressure readings have been on the low side at home.  He is currently taking diltiazem  and ramipril .  Diltiazem  is a fairly new addition from cardiology.  Additionally, patient has an area of concern in the left lower quadrant.  He has a firm area that he would like me to examine.  Also, patient has had slow and ongoing weight loss.  He is now down to 112 pounds.  Has had prior negative workup regarding organic causes for weight loss.  Patient Active Problem List   Diagnosis Date Noted   Lightheadedness 03/21/2024   PAC (premature atrial contraction) 01/23/2024   Palpitations 09/02/2023   S/P CABG (coronary artery bypass graft) 09/02/2023   Interstitial lung disease with progressive fibrotic phenotype in diseases classified elsewhere (HCC) 02/24/2023   Iron deficiency anemia 07/31/2022   Arthritis of carpometacarpal (CMC) joint of both thumbs 05/18/2022   CAD (coronary artery disease) 04/13/2022   Loss of weight 04/13/2022   Pulmonary fibrosis (HCC) 12/31/2021   Aural polyp, left 07/20/2021   Legally blind 08/08/2020   Macular degeneration of both eyes 01/27/2015   Fibroma 09/25/2014   GERD (gastroesophageal reflux disease) 01/10/2014   Hyperlipidemia 01/10/2014   Osteoarthritis of left knee 01/10/2014   Essential hypertension, benign 03/15/2013    Social Hx   Social History   Socioeconomic History   Marital status: Widowed    Spouse name: Not on file   Number of children: Not on file   Years of education: Not on file    Highest education level: Not on file  Occupational History   Not on file  Tobacco Use   Smoking status: Former    Current packs/day: 0.00    Types: Cigarettes    Quit date: 08/04/1995    Years since quitting: 28.6    Passive exposure: Past   Smokeless tobacco: Never  Vaping Use   Vaping status: Never Used  Substance and Sexual Activity   Alcohol use: Not Currently    Comment: rare   Drug use: Never   Sexual activity: Yes    Birth control/protection: None  Other Topics Concern   Not on file  Social History Narrative   ** Merged History Encounter **       12/16/23 lives with son and daughter in [place]. He lives in the basement.    Social Drivers of Corporate investment banker Strain: Low Risk  (12/16/2023)   Overall Financial Resource Strain (CARDIA)    Difficulty of Paying Living Expenses: Not hard at all  Food Insecurity: No Food Insecurity (12/16/2023)   Hunger Vital Sign    Worried About Running Out of Food in the Last Year: Never true    Ran Out of Food in the Last Year: Never true  Transportation Needs: No Transportation Needs (12/16/2023)   PRAPARE - Administrator, Civil Service (Medical): No    Lack of Transportation (Non-Medical): No  Physical Activity: Sufficiently Active (12/16/2023)   Exercise Vital Sign  Days of Exercise per Week: 7 days    Minutes of Exercise per Session: 30 min  Stress: No Stress Concern Present (12/16/2023)   [employer] of Occupational Health - Occupational Stress Questionnaire    Feeling of Stress : Not at all  Social Connections: Moderately Isolated (12/16/2023)   Social Connection and Isolation Panel [NHANES]    Frequency of Communication with Friends and Family: More than three times a week    Frequency of Social Gatherings with Friends and Family: More than three times a week    Attends Religious Services: More than 4 times per year    Active Member of [employer] or Organizations: No    Attends Banker  Meetings: Never    Marital Status: Widowed    Review of Systems Per HPI  Objective:  BP (!) 136/58   Pulse (!) 54   Temp 97.9 F (36.6 C)   Ht 5\' 7"  (1.702 m)   Wt 112 lb (50.8 kg)   SpO2 97%   BMI 17.54 kg/m      03/20/2024    3:12 PM 01/23/2024    9:49 AM 12/22/2023    3:56 PM  BP/Weight  Systolic BP 136 138 142  Diastolic BP 58 74 66  Wt. (Lbs) 112 114.4   BMI 17.54 kg/m2 17.92 kg/m2     Physical Exam Constitutional:      General: He is not in acute distress.    Appearance: Normal appearance.  HENT:     Head: Normocephalic and atraumatic.     Mouth/Throat:     Pharynx: Oropharynx is clear.  Cardiovascular:     Rate and Rhythm: Regular rhythm. Bradycardia present.  Pulmonary:     Effort: Pulmonary effort is normal.     Breath sounds: Normal breath sounds.  Abdominal:     General: There is no distension.     Palpations: Abdomen is soft.     Comments: Mild tenderness in the left lower quadrant.  There is a firm area to palpation over the left lower quadrant.  I suspect that this is scar tissue from prior hernia repair.  Neurological:     Mental Status: He is alert.  Psychiatric:        Mood and Affect: Mood normal.        Behavior: Behavior normal.     Lab Results  Component Value Date   WBC CANCELED 03/20/2024   HGB CANCELED 03/20/2024   HCT CANCELED 03/20/2024   PLT CANCELED 03/20/2024   GLUCOSE 86 03/20/2024   CHOL 111 07/22/2023   TRIG 88 07/22/2023   HDL 36 (L) 07/22/2023   LDLCALC 58 07/22/2023   ALT 23 03/20/2024   AST 31 03/20/2024   NA 138 03/20/2024   K 4.7 03/20/2024   CL 99 03/20/2024   CREATININE 0.70 (L) 03/20/2024   BUN 14 03/20/2024   CO2 25 03/20/2024   TSH 1.100 03/20/2024   PSA 3.11 07/08/2014   INR 1.3 (H) 11/14/2023   HGBA1C 5.9 (H) 04/13/2022     Assessment & Plan:  Lightheadedness Assessment & Plan: Stopping ramipril .  Continuing diltiazem  for now.  Arranging for labs.   Loss of weight Assessment & Plan: Labs  for further evaluation.  Discussed case with primary care physician who recommended CT abdomen and pelvis given unintentional weight loss and tenderness to palpation in the left lower quadrant.  Orders: -     CMP14+EGFR -     TSH  Anemia, unspecified type -  CBC -     Iron, TIBC and Ferritin Panel -     Vitamin B12 -     Folate  Left lower quadrant abdominal pain -     CT ABDOMEN PELVIS W CONTRAST    Follow-up:  Pending work up  [employer] DO [facility]

## 2024-03-21 NOTE — Assessment & Plan Note (Signed)
 Stopping ramipril .  Continuing diltiazem  for now.  Arranging for labs.

## 2024-03-23 LAB — CMP14+EGFR
ALT: 23 IU/L (ref 0–44)
AST: 31 IU/L (ref 0–40)
Albumin: 4.3 g/dL (ref 3.7–4.7)
Alkaline Phosphatase: 121 IU/L (ref 44–121)
BUN/Creatinine Ratio: 20 (ref 10–24)
BUN: 14 mg/dL (ref 8–27)
Bilirubin Total: 0.4 mg/dL (ref 0.0–1.2)
CO2: 25 mmol/L (ref 20–29)
Calcium: 9.4 mg/dL (ref 8.6–10.2)
Chloride: 99 mmol/L (ref 96–106)
Creatinine, Ser: 0.7 mg/dL — ABNORMAL LOW (ref 0.76–1.27)
Globulin, Total: 3.1 g/dL (ref 1.5–4.5)
Glucose: 86 mg/dL (ref 70–99)
Potassium: 4.7 mmol/L (ref 3.5–5.2)
Sodium: 138 mmol/L (ref 134–144)
Total Protein: 7.4 g/dL (ref 6.0–8.5)
eGFR: 91 mL/min/{1.73_m2} (ref >59)

## 2024-03-23 LAB — IRON,TIBC AND FERRITIN PANEL
Ferritin: 77 ng/mL (ref 30–400)
Iron Saturation: 17 % (ref 15–55)
Iron: 48 ug/dL (ref 38–169)
Total Iron Binding Capacity: 286 ug/dL (ref 250–450)
UIBC: 238 ug/dL (ref 111–343)

## 2024-03-23 LAB — CBC

## 2024-03-23 LAB — VITAMIN B12: Vitamin B-12: 585 pg/mL (ref 232–1245)

## 2024-03-23 LAB — TSH: TSH: 1.1 u[IU]/mL (ref 0.450–4.500)

## 2024-03-23 LAB — FOLATE: Folate: 10.9 ng/mL (ref >3.0)

## 2024-03-26 ENCOUNTER — Ambulatory Visit: Payer: Self-pay | Admitting: Family Medicine

## 2024-03-28 ENCOUNTER — Ambulatory Visit: Admitting: Family Medicine

## 2024-03-28 VITALS — BP 124/62 | HR 59 | Temp 98.8°F | Ht 67.0 in | Wt 112.8 lb

## 2024-03-28 DIAGNOSIS — R634 Abnormal weight loss: Secondary | ICD-10-CM

## 2024-03-28 DIAGNOSIS — J8417 Interstitial lung disease with progressive fibrotic phenotype in diseases classified elsewhere: Secondary | ICD-10-CM

## 2024-03-28 NOTE — Progress Notes (Signed)
   Subjective:    Patient ID: Jason Spence, male    DOB: 1941/09/02, 83 y.o.   MRN: [id number]  HPI Patient is here today for follow-up He is trying to do the best he can at eating He has lost a little more weight He denies any other setbacks Denies any blood in stool Does have underlying interstitial lung issues along patient denies any significant shortness of breath He does have macular degeneration issues   Review of Systems     Objective:   Physical Exam  General-in no acute distress Eyes-no discharge Lungs-respiratory rate normal, CTA CV-no murmurs,RRR Extremities skin warm dry no edema Neuro grossly normal Behavior normal, alert Abdominal exam soft no masses Lab work looks reassuring discussed with family and patient     Assessment & Plan:   Weight loss-CAT scan coming up We did discuss ways of improving calories Follow-up if any ongoing troubles Recheck in approximately 4 months It is possible the weight loss could be due to a undiagnosed issue but it is also possible the weight loss is related to combination of burning more calories than taking it as well as his chronic lung disease and increased work

## 2024-04-04 ENCOUNTER — Telehealth: Payer: Self-pay | Admitting: Pharmacist

## 2024-04-04 NOTE — Telephone Encounter (Signed)
   This patient is appearing on a report for being at risk of failing the adherence measure for hypertension (ACEi/ARB) medications this calendar year.   Medication: ramipril  Last fill date: 02/20/24 for 90 day supply  Insurance report was not up to date. No action needed at this time.    Kayston Jodoin Dattero Madalynn Pickelsimer, PharmD, BCACP, CPP Clinical Pharmacist, [facility]

## 2024-04-09 ENCOUNTER — Ambulatory Visit (HOSPITAL_COMMUNITY)
Admission: RE | Admit: 2024-04-09 | Discharge: 2024-04-09 | Disposition: A | Discharge status: Home / Self Care | Source: Ambulatory Visit | Attending: Family Medicine | Admitting: Family Medicine

## 2024-04-09 DIAGNOSIS — R1032 Left lower quadrant pain: Secondary | ICD-10-CM | POA: Insufficient documentation

## 2024-04-09 DIAGNOSIS — K5730 Diverticulosis of large intestine without perforation or abscess without bleeding: Secondary | ICD-10-CM | POA: Diagnosis not present

## 2024-04-09 DIAGNOSIS — N400 Enlarged prostate without lower urinary tract symptoms: Secondary | ICD-10-CM | POA: Diagnosis not present

## 2024-04-09 MED ORDER — IOHEXOL 9 MG/ML PO SOLN
500.0000 mL | ORAL | Status: AC
  Administered 2024-04-09: 500 mL via ORAL

## 2024-04-09 MED ORDER — IOHEXOL 300 MG/ML  SOLN
100.0000 mL | Freq: Once | INTRAMUSCULAR | Status: AC | PRN
Start: 2024-04-09 — End: 2024-04-09
  Administered 2024-04-09: 100 mL via INTRAVENOUS

## 2024-04-09 MED ORDER — IOHEXOL 9 MG/ML PO SOLN
ORAL | Status: AC
Start: 2024-04-09 — End: 2024-04-09
  Filled 2024-04-09: qty 1000

## 2024-05-10 DIAGNOSIS — B351 Tinea unguium: Secondary | ICD-10-CM | POA: Diagnosis not present

## 2024-05-10 DIAGNOSIS — M79674 Pain in right toe(s): Secondary | ICD-10-CM | POA: Diagnosis not present

## 2024-05-15 ENCOUNTER — Other Ambulatory Visit: Payer: Self-pay | Admitting: Family Medicine

## 2024-05-19 ENCOUNTER — Other Ambulatory Visit: Payer: Self-pay | Admitting: Family Medicine

## 2024-05-25 ENCOUNTER — Ambulatory Visit: Admitting: Family Medicine

## 2024-05-28 ENCOUNTER — Encounter (INDEPENDENT_AMBULATORY_CARE_PROVIDER_SITE_OTHER): Payer: Self-pay | Admitting: Gastroenterology

## 2024-05-28 ENCOUNTER — Ambulatory Visit (INDEPENDENT_AMBULATORY_CARE_PROVIDER_SITE_OTHER): Payer: Medicare HMO | Admitting: Gastroenterology

## 2024-05-28 VITALS — BP 167/74 | HR 57 | Temp 97.5°F | Ht 67.0 in | Wt 117.6 lb

## 2024-05-28 DIAGNOSIS — D509 Iron deficiency anemia, unspecified: Secondary | ICD-10-CM | POA: Diagnosis not present

## 2024-05-28 DIAGNOSIS — R634 Abnormal weight loss: Secondary | ICD-10-CM | POA: Diagnosis not present

## 2024-05-28 NOTE — Patient Instructions (Signed)
-  continue to liberalize diet -iron rich diet -Increase water  intake, aim for atleast 64 oz per day -Increase fruits, veggies and whole grains, kiwi and prunes are especially good for constipation -Start taking Miralax 1 capful every day for one week. If bowel movements do not improve, increase to 1 capful every 12 hours. If after two weeks there is no improvement, increase to 1 capful every 8 hours -continue protein supplement and following closely with PCP regarding weight -as discussed, I would check your blood pressure once daily at the same time each day and provide this trend to your PCP as your BP is high today x2  Follow up 1 year

## 2024-05-28 NOTE — Progress Notes (Signed)
 Referring Provider: Alphonsa Glendia LABOR, MD Primary Care Physician:  Alphonsa Glendia LABOR, MD Primary GI Physician: Dr. Eartha   Chief Complaint  Patient presents with   Follow-up    Patient here today for a follow up on Loss of weight and IDA. Patient denies any current any issues.    HPI:   Jason Spence is a 83 y.o. male with past medical history of GERD, IDA, HTN   Patient presenting today for:  Follow up of IDA and weight loss   Last seen August 2024, at that time, doing well, recent iron studies with iron 148, tibc 322, sat 46 Hgb 13.3 and ferritin 48. Doing well, taking iron pill daily. Having some constipation, taking miralax everyday. Weight up 2 pounds. Staying active with a lot of walking   Recommended continuing miralax daily, Iron pill daily, liberalize diet, protein shake daily  Present:  Most recent labs with CBC in June hgb 12.7, TIBC 286, Iron 48, sat 17, ferritin 77 b12 585, folate 10.9 TSH 1.1  Weight up to 117 lbs from 112 lbs in February (range 112-124lbs over the last 2 years)  States he is concerned about his weight as it continues to fluctuate. He has been doing a new protein powder for the past few weeks. He feels that his appetite is good. Endorses 3 meals per day. Recent CT A/p with contrast in June done by PCP suggestive of constipation-large stool burden. Having a BM usually once per day, usually feels he is emptying out sufficiently. Using miralax PRN. No abdominal pain, rectal bleeding or melena. States PCP also stopped his iron pill in June as labs looked good. He also reports one of his BP meds was stopped as well. Has not been checking consistently at home. Has follow up with PCP in October.  Givens capsule study: 08/24/22: Slight congestion in the proximal small bowel without bleeding. No other abnormalities.  Last Colonoscopy:07/2022 - The examined portion of the ileum was normal. - Two 3 to 4 mm polyps in the transverse colon and in the cecum - Two  3 to 4 mm polyps in the rectum, removed with a cold snare. (3 TAs, 1 HP polyp)  - Diverticulosis in the sigmoid colon, in the descending colon and in the transverse colon. - Non-bleeding internal hemorrhoids. Last Endoscopy: 07/2022 - 1 cm sliding hiatal hernia. - Gastroesophageal flap valve classified as Hill   Grade II (fold present, opens with respiration). - Normal stomach.  - Normal examined duodenum. Biopsied-normal      Filed Weights   05/28/24 0948  Weight: 117 lb 9.6 oz (53.3 kg)     Past Medical History:  Diagnosis Date   Anal stricture 10/18/2006   Arthritis    Hypercholesteremia    Hypertension    Myocardial infarct [facility])     Past Surgical History:  Procedure Laterality Date   BALLOON DILATION N/A 01/05/2013   Procedure: BALLOON DILATION;  Surgeon: Claudis RAYMOND Rivet, MD;  [facility];  Service: Endoscopy;  Laterality: N/A;   BIOPSY  02/27/2018   Procedure: BIOPSY;  Surgeon: Rivet Claudis RAYMOND, MD;  [facility];  Service: Endoscopy;;  esophagus   BIOPSY  08/05/2022   Procedure: BIOPSY;  Surgeon: Eartha Angelia Sieving, MD;  [facility];  Service: Gastroenterology;;   COLONOSCOPY WITH ESOPHAGOGASTRODUODENOSCOPY (EGD) N/A 01/05/2013   Procedure: COLONOSCOPY WITH ESOPHAGOGASTRODUODENOSCOPY (EGD);  Surgeon: Claudis RAYMOND Rivet, MD;  [facility];  Service: Endoscopy;  Laterality:  N/A;  100   COLONOSCOPY WITH PROPOFOL  N/A 08/05/2022   Procedure: COLONOSCOPY WITH PROPOFOL ;  Surgeon: Eartha Angelia Sieving, MD;  [facility];  Service: Gastroenterology;  Laterality: N/A;  1030 ASA 3   CORONARY ARTERY BYPASS GRAFT     ESOPHAGEAL DILATION N/A 02/27/2018   Procedure: ESOPHAGEAL DILATION;  Surgeon: Golda Claudis PENNER, MD;  [facility];  Service: Endoscopy;  Laterality: N/A;   ESOPHAGOGASTRODUODENOSCOPY N/A 02/27/2018   Procedure: ESOPHAGOGASTRODUODENOSCOPY (EGD);  Surgeon: Golda Claudis PENNER, MD;  [facility];  Service: Endoscopy;  Laterality: N/A;  12:25   ESOPHAGOGASTRODUODENOSCOPY (EGD) WITH PROPOFOL  N/A 08/05/2022   Procedure: ESOPHAGOGASTRODUODENOSCOPY (EGD) WITH PROPOFOL ;  Surgeon: Eartha Angelia Sieving, MD;  [facility];  Service: Gastroenterology;  Laterality: N/A;   GIVENS CAPSULE STUDY N/A 08/24/2022   Procedure: GIVENS CAPSULE STUDY;  Surgeon: Eartha Angelia Sieving, MD;  [facility];  Service: Gastroenterology;  Laterality: N/A;  730 am   HERNIA REPAIR     POLYPECTOMY  08/05/2022   Procedure: POLYPECTOMY;  Surgeon: Eartha Angelia, Sieving, MD;  [facility];  Service: Gastroenterology;;    Current Outpatient Medications  Medication Sig Dispense Refill   albuterol  (VENTOLIN  HFA) 108 (90 Base) MCG/ACT inhaler Inhale 2 puffs into the lungs every 4 (four) hours as needed for wheezing. 1 each 2   Ascorbic Acid  (VITAMIN C ) 1000 MG tablet Take 1,000 mg by mouth daily.     aspirin  81 MG tablet Take 1 tablet (81 mg total) by mouth daily. 30 tablet    budesonide -formoterol  (BREYNA ) 160-4.5 MCG/ACT inhaler INHALE 2 PUFFS BY MOUTH TWICE DAILY -  RINSE  MOUTH  WITH  WATER   AFTER  EACH  USE 11 g 5   cetirizine  (ZYRTEC  ALLERGY) 10 MG tablet Take 1 tablet (10 mg total) by mouth daily. 30 tablet 2   Cholecalciferol (VITAMIN D3 PO) Take 1 tablet by mouth daily.     diltiazem  (CARDIZEM ) 30 MG tablet Take 1 tablet (30 mg total) by mouth every 12 (twelve) hours. 60 tablet 5   fish oil-omega-3 fatty acids 1000 MG capsule Take 1 g by mouth daily.      ketoconazole  (NIZORAL ) 2 % cream APPLY  CREAM TOPICALLY TO AFFECTED AREA TWICE DAILY AS NEEDED 60 g 0   meclizine  (ANTIVERT ) 25 MG tablet Take 1 tablet (25 mg total) by mouth 3 (three) times daily as needed for dizziness. 21 tablet 0   Multiple Vitamins-Minerals (PRESERVISION AREDS 2 PO) Take 1 capsule by mouth 2 (two) times daily.     pantoprazole  (PROTONIX ) 40 MG tablet TAKE 1 TABLET BY MOUTH ONCE DAILY 30  MINUTES BEFORE BREAKFAST 90 tablet 0   pravastatin  (PRAVACHOL ) 40 MG tablet Take 1 tablet (40 mg total) by mouth daily. 90 tablet 0   No current facility-administered medications for this visit.    Allergies as of 05/28/2024   (No Known Allergies)    Social History   Socioeconomic History   Marital status: Widowed    Spouse name: Not on file   Number of children: Not on file   Years of education: Not on file   Highest education level: Not on file  Occupational History   Not on file  Tobacco Use   Smoking status: Former    Current packs/day: 0.00    Types: Cigarettes    Quit date: 08/04/1995    Years since quitting: 28.8    Passive exposure: Past   Smokeless tobacco:  Never  Vaping Use   Vaping status: Never Used  Substance and Sexual Activity   Alcohol use: Not Currently    Comment: rare   Drug use: Never   Sexual activity: Yes    Birth control/protection: None  Other Topics Concern   Not on file  Social History Narrative   ** Merged History Encounter **       12/16/23 lives with son and daughter in [place]. He lives in the basement.    Social Drivers of Corporate investment banker Strain: Low Risk  (12/16/2023)   Overall Financial Resource Strain (CARDIA)    Difficulty of Paying Living Expenses: Not hard at all  Food Insecurity: No Food Insecurity (12/16/2023)   Hunger Vital Sign    Worried About Running Out of Food in the Last Year: Never true    Ran Out of Food in the Last Year: Never true  Transportation Needs: No Transportation Needs (12/16/2023)   PRAPARE - Administrator, Civil Service (Medical): No    Lack of Transportation (Non-Medical): No  Physical Activity: Sufficiently Active (12/16/2023)   Exercise Vital Sign    Days of Exercise per Week: 7 days    Minutes of Exercise per Session: 30 min  Stress: No Stress Concern Present (12/16/2023)   [employer] of Occupational Health - Occupational Stress Questionnaire    Feeling of Stress : Not at  all  Social Connections: Moderately Isolated (12/16/2023)   Social Connection and Isolation Panel    Frequency of Communication with Friends and Family: More than three times a week    Frequency of Social Gatherings with Friends and Family: More than three times a week    Attends Religious Services: More than 4 times per year    Active Member of [employer] or Organizations: No    Attends Banker Meetings: Never    Marital Status: Widowed    Review of systems General: negative for malaise, night sweats, fever, chills +weight fluctuation Neck: Negative for lumps, goiter, pain and significant neck swelling Resp: Negative for cough, wheezing, dyspnea at rest CV: Negative for chest pain, leg swelling, palpitations, orthopnea GI: denies melena, hematochezia, nausea, vomiting, diarrhea, constipation, dysphagia, odyonophagia, early satiety +weight fluctuation  MSK: Negative for joint pain or swelling, back pain, and muscle pain. Derm: Negative for itching or rash Psych: Denies depression, anxiety, memory loss, confusion. No homicidal or suicidal ideation.  Heme: Negative for prolonged bleeding, bruising easily, and swollen nodes. Endocrine: Negative for cold or heat intolerance, polyuria, polydipsia and goiter. Neuro: negative for tremor, gait imbalance, syncope and seizures. The remainder of the review of systems is noncontributory.  Physical Exam: BP (!) 167/74 (BP Location: Left Arm, Patient Position: Sitting, Cuff Size: Normal)   Pulse (!) 57   Temp (!) 97.5 F (36.4 C) (Temporal)   Ht 5' 7 (1.702 m)   Wt 117 lb 9.6 oz (53.3 kg)   BMI 18.42 kg/m  General:   Alert and oriented. No distress noted. Pleasant and cooperative.  Head:  Normocephalic and atraumatic. Eyes:  Conjuctiva clear without scleral icterus. Mouth:  Oral mucosa pink and moist. Good dentition. No lesions. Heart: Normal rate and rhythm, s1 and s2 heart sounds present.  Lungs: Clear lung sounds in all lobes.  Respirations equal and unlabored. Abdomen:  +BS, soft, non-tender and non-distended. No rebound or guarding. No HSM or masses noted. Derm: No palmar erythema or jaundice Msk:  Symmetrical without gross deformities. Normal posture. Extremities:  Without  edema. Neurologic:  Alert and  oriented x4 Psych:  Alert and cooperative. Normal mood and affect.  Invalid input(s): 6 MONTHS   ASSESSMENT: Jason Spence is a 83 y.o. male presenting today for follow up of IDA and Weight loss  IDA:  Endoscopic workup as above with EGD/Colonoscopy/givens capsule study without definitive etiology determined. No rectal bleeding or melena. Celiac testing negative in the past. Possibly some aspect of iron malabsorption. He has no rectal bleeding or melena. Recent labs above with stable Iron levels, PCP advised him he could stop PO iron. He is having routine labs with his PCP. He should let me know if he develops sob, dizziness, rectal bleeding or melena.   Weight loss: weight fluctuation between 112-124 over the past couple of years with weight up and down, recently back up to 117 from 112 a few months ago. Endoscopic workup, as above, as well as recent CT A/P in June and CT of the chest in January without acute findings, acute changes from known lung disease. He is trying to do a protein supplement. His labs to include thyroid  testing are WNL. PCP is following weight closely, thought this likely could be secondary to his known lung disease. Reassuringly he has gained back a few pounds recently. I recommend he continue protein supplementation and liberalize diet.   In regards to his BP, BP elevated in office today. He has a cuff at home but is not checking regularly. Reports one of his HTN meds was recently discontinued. I recommended he check BP once a day, at the same time over the next couple of weeks and take this trend to his follow up with PCP in October.    PLAN:  -continue to liberalize diet -iron rich  diet -Increase water  intake, aim for atleast 64 oz per day -Increase fruits, veggies and whole grains, kiwi and prunes are especially good for constipation -Start taking Miralax 1 capful every day for one week. If bowel movements do not improve, increase to 1 capful every 12 hours. If after two weeks there is no improvement, increase to 1 capful every 8 hours -continue protein supplement -check BP once daily x2 week to show PCP at follow up in October  All questions were answered, patient verbalized understanding and is in agreement with plan as outlined above.    Follow Up: 1 year   Cindee Mclester L. Mariette, MSN, APRN, AGNP-C Adult-Gerontology Nurse Practitioner [facility]  I have reviewed the note and agree with the APP's assessment as described in this progress note  Toribio Fortune, MD Gastroenterology and Hepatology [facility]

## 2024-06-12 ENCOUNTER — Ambulatory Visit: Payer: Self-pay | Admitting: *Deleted

## 2024-06-12 NOTE — Telephone Encounter (Signed)
 FYI Only or Action Required?: FYI only for provider.  Patient was last seen in primary care on 03/28/2024 by Alphonsa Glendia LABOR, MD.  Called Nurse Triage reporting No chief complaint on file..  Symptoms began about a month ago.  Interventions attempted: Nothing.  Symptoms are: gradually worsening.  Triage Disposition: See Physician Within 24 Hours  Patient/caregiver understands and will follow disposition?: Yes- no open appointment with provider- scheduled first available appointment-outside disposition.  Call [phone] if sooner appointment is available    Reason for Disposition  [1] Swelling is painful to touch AND [2] no fever  Answer Assessment - Initial Assessment Questions 1. APPEARANCE of SWELLING: What does it look like?     Smooth, red 2. SIZE: How large is the swelling? (e.g., inches, cm; or compare to size of pinhead, tip of pen, eraser, coin, pea, grape, ping pong ball)      Marble size 3. LOCATION: Where is the swelling located?     Back of shoulder- left 4. ONSET: When did the swelling start?     4 weeks 5. COLOR: What color is it? Is there more than one color?     Started as small bump-red, angry 6. PAIN: Is there any pain? If Yes, ask: How bad is the pain? (Scale 1-10; or mild, moderate, severe)       Painful to lean on it- tender to touch 7. ITCH: Does it itch? If Yes, ask: How bad is the itch?      no 8. CAUSE: What do you think caused the swelling?     unsure 9 OTHER SYMPTOMS: Do you have any other symptoms? (e.g., fever)     no  Protocols used: Skin Lump or Localized Swelling-A-AH   Copied from CRM #[id number]. Topic: Clinical - Red Word Triage >> Jun 12, 2024 11:28 AM Harlene ORN wrote: Red Word that prompted transfer to Nurse Triage: knot on his shoulder for a few weeks and getting worse in the past two weeks.

## 2024-06-14 ENCOUNTER — Ambulatory Visit (INDEPENDENT_AMBULATORY_CARE_PROVIDER_SITE_OTHER): Admitting: Family Medicine

## 2024-06-14 VITALS — BP 120/68 | HR 60 | Temp 98.4°F | Ht 67.0 in | Wt 118.0 lb

## 2024-06-14 DIAGNOSIS — L720 Epidermal cyst: Secondary | ICD-10-CM | POA: Diagnosis not present

## 2024-06-14 MED ORDER — LIDOCAINE-EPINEPHRINE 1 %-1:100000 IJ SOLN
3.0000 mL | Freq: Once | INTRAMUSCULAR | Status: AC
  Administered 2024-06-14: 3 mL via INTRADERMAL

## 2024-06-14 MED ORDER — DOXYCYCLINE HYCLATE 100 MG PO TABS: 100.0000 mg | ORAL_TABLET | Freq: Two times a day (BID) | ORAL | 0 refills | Status: DC

## 2024-06-14 NOTE — Progress Notes (Signed)
 Subjective:  Patient ID: Jason Spence, male    DOB: 07/26/1941  Age: 83 y.o. MRN: [id number]  CC:   Chief Complaint  Patient presents with   cyst on left shoulder     About 4 weeks present    HPI:  83 year old male presents for evaluation of the above.  Patient reports that he has had a raised area on his left upper back below the left shoulder blade for the past month.  It is now causing him discomfort particular when he lies back on the area.  He believes that this is a cyst and would like me to remove it today.  Will discuss today.  No fever.  No current drainage.  Patient Active Problem List   Diagnosis Date Noted   Epidermal cyst 06/18/2024   PAC (premature atrial contraction) 01/23/2024   Palpitations 09/02/2023   S/P CABG (coronary artery bypass graft) 09/02/2023   Interstitial lung disease with progressive fibrotic phenotype in diseases classified elsewhere (HCC) 02/24/2023   Iron deficiency anemia 07/31/2022   Arthritis of carpometacarpal (CMC) joint of both thumbs 05/18/2022   CAD (coronary artery disease) 04/13/2022   Loss of weight 04/13/2022   Pulmonary fibrosis (HCC) 12/31/2021   Aural polyp, left 07/20/2021   Legally blind 08/08/2020   Macular degeneration of both eyes 01/27/2015   Fibroma 09/25/2014   GERD (gastroesophageal reflux disease) 01/10/2014   Hyperlipidemia 01/10/2014   Osteoarthritis of left knee 01/10/2014   Essential hypertension, benign 03/15/2013    Social Hx   Social History   Socioeconomic History   Marital status: Widowed    Spouse name: Not on file   Number of children: Not on file   Years of education: Not on file   Highest education level: Not on file  Occupational History   Not on file  Tobacco Use   Smoking status: Former    Current packs/day: 0.00    Types: Cigarettes    Quit date: 08/04/1995    Years since quitting: 28.8    Passive exposure: Past   Smokeless tobacco: Never  Vaping Use   Vaping status: Never Used   Substance and Sexual Activity   Alcohol use: Not Currently    Comment: rare   Drug use: Never   Sexual activity: Yes    Birth control/protection: None  Other Topics Concern   Not on file  Social History Narrative   ** Merged History Encounter **       12/16/23 lives with son and daughter in [place]. He lives in the basement.    Social Drivers of Corporate investment banker Strain: Low Risk  (12/16/2023)   Overall Financial Resource Strain (CARDIA)    Difficulty of Paying Living Expenses: Not hard at all  Food Insecurity: No Food Insecurity (12/16/2023)   Hunger Vital Sign    Worried About Running Out of Food in the Last Year: Never true    Ran Out of Food in the Last Year: Never true  Transportation Needs: No Transportation Needs (12/16/2023)   PRAPARE - Administrator, Civil Service (Medical): No    Lack of Transportation (Non-Medical): No  Physical Activity: Sufficiently Active (12/16/2023)   Exercise Vital Sign    Days of Exercise per Week: 7 days    Minutes of Exercise per Session: 30 min  Stress: No Stress Concern Present (12/16/2023)   [employer] of Occupational Health - Occupational Stress Questionnaire    Feeling of Stress : Not at all  Social Connections: Moderately Isolated (12/16/2023)   Social Connection and Isolation Panel    Frequency of Communication with Friends and Family: More than three times a week    Frequency of Social Gatherings with Friends and Family: More than three times a week    Attends Religious Services: More than 4 times per year    Active Member of [employer] or Organizations: No    Attends Banker Meetings: Never    Marital Status: Widowed    Review of Systems Per HPI  Objective:  BP 120/68   Pulse 60   Temp 98.4 F (36.9 C)   Ht 5' 7 (1.702 m)   Wt 118 lb (53.5 kg)   SpO2 98%   BMI 18.48 kg/m      06/14/2024   11:20 AM 05/28/2024    9:51 AM 05/28/2024    9:48 AM  BP/Weight  Systolic BP 120 167 161   Diastolic BP 68 74 64  Wt. (Lbs) 118  117.6  BMI 18.48 kg/m2  18.42 kg/m2    Physical Exam Vitals and nursing note reviewed.  Constitutional:      General: He is not in acute distress.    Appearance: Normal appearance.  HENT:     Head: Normocephalic and atraumatic.  Skin:    Comments: Small cyst to the left upper back.  Neurological:     Mental Status: He is alert.   Procedure - Cyst removal Local anesthesia with 1% lidocaine  with epinephrine . Area was cleansed with Betadine and straight incision was made with 11 blade.  Cyst contents removed.  Wound was closed with 4-0 nylon sutures (2).  Patient tolerated procedure well.  No significant bleeding.  Assessment & Plan:  Epidermal cyst Assessment & Plan: Removed today.  See procedure note.  Patient to return in 7 days for suture removal.  Given associated erythema, placed on doxycycline  to ensure no underlying infection.  Orders: -     Lidocaine -EPINEPHrine   Other orders -     Doxycycline  Hyclate; Take 1 tablet (100 mg total) by mouth 2 (two) times daily. Take with food and water . Avoid direct sunlight.  Dispense: 14 tablet; Refill: 0    Follow-up:  1 week  Shaleka Brines Bluford DO [facility]

## 2024-06-14 NOTE — Patient Instructions (Signed)
Follow up in 1 week for suture removal.  Take care  Dr. Lacinda Axon

## 2024-06-18 DIAGNOSIS — L720 Epidermal cyst: Secondary | ICD-10-CM | POA: Insufficient documentation

## 2024-06-18 NOTE — Assessment & Plan Note (Addendum)
 Removed today.  See procedure note.  Patient to return in 7 days for suture removal.  Given associated erythema, placed on doxycycline  to ensure no underlying infection.

## 2024-06-21 ENCOUNTER — Ambulatory Visit (INDEPENDENT_AMBULATORY_CARE_PROVIDER_SITE_OTHER): Admitting: Family Medicine

## 2024-06-21 VITALS — BP 148/56 | HR 55 | Ht 67.0 in | Wt 118.0 lb

## 2024-06-21 DIAGNOSIS — L720 Epidermal cyst: Secondary | ICD-10-CM

## 2024-06-21 DIAGNOSIS — Z23 Encounter for immunization: Secondary | ICD-10-CM | POA: Diagnosis not present

## 2024-06-21 NOTE — Patient Instructions (Signed)
 Wound looks good.  Take care  Dr. Bluford   Follow up with [name] as you normally do.

## 2024-06-21 NOTE — Assessment & Plan Note (Signed)
Sutures removed today.

## 2024-06-21 NOTE — Progress Notes (Signed)
 Subjective:  Patient ID: Jason Spence, male    DOB: 01-14-1941  Age: 83 y.o. MRN: [id number]  CC:  Suture removal   HPI:  83 year old male presents today for suture removal. Recent epidermal cyst removal. Area as healed well. Sutures intact. No redness, drainage, pain.   Would like his flu shot today as well.   Patient Active Problem List   Diagnosis Date Noted   Epidermal cyst 06/18/2024   PAC (premature atrial contraction) 01/23/2024   Palpitations 09/02/2023   S/P CABG (coronary artery bypass graft) 09/02/2023   Interstitial lung disease with progressive fibrotic phenotype in diseases classified elsewhere (HCC) 02/24/2023   Iron deficiency anemia 07/31/2022   Arthritis of carpometacarpal (CMC) joint of both thumbs 05/18/2022   CAD (coronary artery disease) 04/13/2022   Loss of weight 04/13/2022   Pulmonary fibrosis (HCC) 12/31/2021   Aural polyp, left 07/20/2021   Legally blind 08/08/2020   Macular degeneration of both eyes 01/27/2015   Fibroma 09/25/2014   GERD (gastroesophageal reflux disease) 01/10/2014   Hyperlipidemia 01/10/2014   Osteoarthritis of left knee 01/10/2014   Essential hypertension, benign 03/15/2013    Social Hx   Social History   Socioeconomic History   Marital status: Widowed    Spouse name: Not on file   Number of children: Not on file   Years of education: Not on file   Highest education level: Not on file  Occupational History   Not on file  Tobacco Use   Smoking status: Former    Current packs/day: 0.00    Types: Cigarettes    Quit date: 08/04/1995    Years since quitting: 28.9    Passive exposure: Past   Smokeless tobacco: Never  Vaping Use   Vaping status: Never Used  Substance and Sexual Activity   Alcohol use: Not Currently    Comment: rare   Drug use: Never   Sexual activity: Yes    Birth control/protection: None  Other Topics Concern   Not on file  Social History Narrative   ** Merged History Encounter **        12/16/23 lives with son and daughter in [place]. He lives in the basement.    Social Drivers of Corporate investment banker Strain: Low Risk  (12/16/2023)   Overall Financial Resource Strain (CARDIA)    Difficulty of Paying Living Expenses: Not hard at all  Food Insecurity: No Food Insecurity (12/16/2023)   Hunger Vital Sign    Worried About Running Out of Food in the Last Year: Never true    Ran Out of Food in the Last Year: Never true  Transportation Needs: No Transportation Needs (12/16/2023)   PRAPARE - Administrator, Civil Service (Medical): No    Lack of Transportation (Non-Medical): No  Physical Activity: Sufficiently Active (12/16/2023)   Exercise Vital Sign    Days of Exercise per Week: 7 days    Minutes of Exercise per Session: 30 min  Stress: No Stress Concern Present (12/16/2023)   [employer] of Occupational Health - Occupational Stress Questionnaire    Feeling of Stress : Not at all  Social Connections: Moderately Isolated (12/16/2023)   Social Connection and Isolation Panel    Frequency of Communication with Friends and Family: More than three times a week    Frequency of Social Gatherings with Friends and Family: More than three times a week    Attends Religious Services: More than 4 times per year  Active Member of Clubs or Organizations: No    Attends Banker Meetings: Never    Marital Status: Widowed    Review of Systems Per HPI  Objective:  BP (!) 148/56   Pulse (!) 55   Ht 5' 7 (1.702 m)   Wt 118 lb (53.5 kg)   SpO2 99%   BMI 18.48 kg/m      06/21/2024    2:44 PM 06/21/2024    2:43 PM 06/14/2024   11:20 AM  BP/Weight  Systolic BP 148 147 120  Diastolic BP 56 62 68  Wt. (Lbs)  118 118  BMI  18.48 kg/m2 18.48 kg/m2    Physical Exam Vitals and nursing note reviewed.  Constitutional:      Appearance: Normal appearance.  Pulmonary:     Effort: Pulmonary effort is normal. No respiratory distress.  Skin:    Comments: Wound  is well healed.   Neurological:     Mental Status: He is alert.     Lab Results  Component Value Date   WBC CANCELED 03/20/2024   HGB 12.7 (L) 12/14/2023   HCT 39.4 12/14/2023   PLT 216 12/14/2023   GLUCOSE 86 03/20/2024   CHOL 111 07/22/2023   TRIG 88 07/22/2023   HDL 36 (L) 07/22/2023   LDLCALC 58 07/22/2023   ALT 23 03/20/2024   AST 31 03/20/2024   NA 138 03/20/2024   K 4.7 03/20/2024   CL 99 03/20/2024   CREATININE 0.70 (L) 03/20/2024   BUN 14 03/20/2024   CO2 25 03/20/2024   TSH 1.100 03/20/2024   PSA 3.11 07/08/2014   INR 1.3 (H) 11/14/2023   HGBA1C 5.9 (H) 04/13/2022   Sutures removed today (2) in standard fashion without difficulty.  Assessment & Plan:  Epidermal cyst Assessment & Plan: Sutures removed today.   Encounter for immunization -     Flu vaccine HIGH DOSE PF(Fluzone Trivalent)    Follow-up:  With PCP as he normally does.  Jacqulyn Ahle DO [facility]

## 2024-06-26 ENCOUNTER — Other Ambulatory Visit: Payer: Self-pay | Admitting: Family Medicine

## 2024-06-27 ENCOUNTER — Other Ambulatory Visit: Payer: Self-pay

## 2024-06-27 MED ORDER — PANTOPRAZOLE SODIUM 40 MG PO TBEC: DELAYED_RELEASE_TABLET | ORAL | 0 refills | Status: DC

## 2024-07-11 ENCOUNTER — Ambulatory Visit: Admitting: Family Medicine

## 2024-07-11 ENCOUNTER — Encounter: Payer: Self-pay | Admitting: Family Medicine

## 2024-07-11 VITALS — BP 120/58 | HR 59 | Temp 98.1°F | Ht 67.0 in | Wt 120.0 lb

## 2024-07-11 DIAGNOSIS — E7849 Other hyperlipidemia: Secondary | ICD-10-CM

## 2024-07-11 DIAGNOSIS — I10 Essential (primary) hypertension: Secondary | ICD-10-CM | POA: Diagnosis not present

## 2024-07-11 DIAGNOSIS — J8417 Interstitial lung disease with progressive fibrotic phenotype in diseases classified elsewhere: Secondary | ICD-10-CM | POA: Diagnosis not present

## 2024-07-11 DIAGNOSIS — H548 Legal blindness, as defined in USA: Secondary | ICD-10-CM | POA: Diagnosis not present

## 2024-07-11 MED ORDER — KETOCONAZOLE 2 % EX CREA: TOPICAL_CREAM | Freq: Two times a day (BID) | CUTANEOUS | 3 refills | Status: AC | PRN

## 2024-07-11 MED ORDER — DILTIAZEM HCL 30 MG PO TABS: 30.0000 mg | ORAL_TABLET | Freq: Two times a day (BID) | ORAL | 5 refills | Status: AC

## 2024-07-11 MED ORDER — PRAVASTATIN SODIUM 40 MG PO TABS: 40.0000 mg | ORAL_TABLET | Freq: Every day | ORAL | 2 refills | Status: AC

## 2024-07-11 MED ORDER — PANTOPRAZOLE SODIUM 40 MG PO TBEC: DELAYED_RELEASE_TABLET | ORAL | 2 refills | Status: AC

## 2024-07-11 NOTE — Progress Notes (Signed)
   Subjective:    Patient ID: Jason Spence, male    DOB: June 26, 1941, 83 y.o.   MRN: [id number]  HPI 3 month follow up  Concerns with throat drainage and dry mouth  Patient has little bit of throat drainage down the back of his throat as well as dry mouth in the morning It is possible he could be a mouth breather Does not have any significant troubles during the day Has interstitial lung disease Also has underlying macular degeneration Legally blind Family drives him He walks with a cane when he is outside tries to avoid accidents He will be seeing his lung specialist for follow-up Denies any increase in shortness of breath or fevers or cough We did discuss if he starts having any significant respiratory illness this fall or winter to follow-up immediately Immunizations discussed in detail Uses his inhalers when he feels like he needs it but otherwise does not use it Uses diltiazem  on a regular basis keep heart rate under decent control Takes his acid blocker on a regular basis keep acid reflux under decent control Takes his cholesterol medicine regular basis denies any problems  Review of Systems     Objective:   Physical Exam  General-in no acute distress Eyes-no discharge Lungs-respiratory rate normal, CTA CV-no murmurs,RRR Extremities skin warm dry no edema Neuro grossly normal Behavior normal, alert       Assessment & Plan:  Patient is doing a better job eating his weight is coming up 1. Interstitial lung disease with progressive fibrotic phenotype in diseases classified elsewhere [facility]) (Primary) He will see specialist follow-up Recently had hematology blood work along with thyroid  in June We will do cholesterol later this year  2. Other hyperlipidemia Cholesterol profile later this year Continue medication continue healthy diet  3. Essential hypertension, benign Will check metabolic 7 later this year Continue current medication  4. Legally  blind Macular degeneration doing the best he can  Follow-up 6 months

## 2024-07-19 DIAGNOSIS — M79674 Pain in right toe(s): Secondary | ICD-10-CM | POA: Diagnosis not present

## 2024-07-19 DIAGNOSIS — B351 Tinea unguium: Secondary | ICD-10-CM | POA: Diagnosis not present

## 2024-07-20 ENCOUNTER — Ambulatory Visit: Admitting: Family Medicine

## 2024-07-25 ENCOUNTER — Other Ambulatory Visit: Payer: Self-pay | Admitting: *Deleted

## 2024-07-25 DIAGNOSIS — Z79899 Other long term (current) drug therapy: Secondary | ICD-10-CM

## 2024-07-25 DIAGNOSIS — I10 Essential (primary) hypertension: Secondary | ICD-10-CM

## 2024-07-25 DIAGNOSIS — E7849 Other hyperlipidemia: Secondary | ICD-10-CM

## 2024-08-08 DIAGNOSIS — Z681 Body mass index (BMI) 19 or less, adult: Secondary | ICD-10-CM | POA: Diagnosis not present

## 2024-08-08 DIAGNOSIS — J189 Pneumonia, unspecified organism: Secondary | ICD-10-CM | POA: Diagnosis not present

## 2024-08-09 ENCOUNTER — Inpatient Hospital Stay (HOSPITAL_COMMUNITY)
Admission: EM | Admit: 2024-08-09 | Discharge: 2024-08-11 | DRG: 195 | Disposition: A | Discharge status: Home / Self Care | Attending: Internal Medicine | Admitting: Internal Medicine

## 2024-08-09 ENCOUNTER — Emergency Department (HOSPITAL_COMMUNITY)

## 2024-08-09 ENCOUNTER — Encounter (HOSPITAL_COMMUNITY): Payer: Self-pay | Admitting: Internal Medicine

## 2024-08-09 ENCOUNTER — Other Ambulatory Visit: Payer: Self-pay

## 2024-08-09 DIAGNOSIS — I10 Essential (primary) hypertension: Secondary | ICD-10-CM | POA: Diagnosis present

## 2024-08-09 DIAGNOSIS — J90 Pleural effusion, not elsewhere classified: Secondary | ICD-10-CM | POA: Diagnosis not present

## 2024-08-09 DIAGNOSIS — E782 Mixed hyperlipidemia: Secondary | ICD-10-CM | POA: Diagnosis present

## 2024-08-09 DIAGNOSIS — Z7982 Long term (current) use of aspirin: Secondary | ICD-10-CM | POA: Diagnosis not present

## 2024-08-09 DIAGNOSIS — Z87891 Personal history of nicotine dependence: Secondary | ICD-10-CM

## 2024-08-09 DIAGNOSIS — J181 Lobar pneumonia, unspecified organism: Secondary | ICD-10-CM | POA: Diagnosis not present

## 2024-08-09 DIAGNOSIS — I252 Old myocardial infarction: Secondary | ICD-10-CM

## 2024-08-09 DIAGNOSIS — J189 Pneumonia, unspecified organism: Secondary | ICD-10-CM | POA: Diagnosis not present

## 2024-08-09 DIAGNOSIS — I2510 Atherosclerotic heart disease of native coronary artery without angina pectoris: Secondary | ICD-10-CM | POA: Diagnosis present

## 2024-08-09 DIAGNOSIS — Z1152 Encounter for screening for COVID-19: Secondary | ICD-10-CM | POA: Diagnosis not present

## 2024-08-09 DIAGNOSIS — D649 Anemia, unspecified: Secondary | ICD-10-CM | POA: Diagnosis not present

## 2024-08-09 DIAGNOSIS — H3530 Unspecified macular degeneration: Secondary | ICD-10-CM | POA: Diagnosis present

## 2024-08-09 DIAGNOSIS — J849 Interstitial pulmonary disease, unspecified: Secondary | ICD-10-CM | POA: Diagnosis not present

## 2024-08-09 DIAGNOSIS — K219 Gastro-esophageal reflux disease without esophagitis: Secondary | ICD-10-CM | POA: Diagnosis not present

## 2024-08-09 DIAGNOSIS — R590 Localized enlarged lymph nodes: Secondary | ICD-10-CM | POA: Diagnosis not present

## 2024-08-09 DIAGNOSIS — Z8249 Family history of ischemic heart disease and other diseases of the circulatory system: Secondary | ICD-10-CM | POA: Diagnosis not present

## 2024-08-09 DIAGNOSIS — J8410 Pulmonary fibrosis, unspecified: Secondary | ICD-10-CM | POA: Diagnosis present

## 2024-08-09 DIAGNOSIS — Z951 Presence of aortocoronary bypass graft: Secondary | ICD-10-CM

## 2024-08-09 DIAGNOSIS — R109 Unspecified abdominal pain: Secondary | ICD-10-CM | POA: Diagnosis not present

## 2024-08-09 LAB — COMPREHENSIVE METABOLIC PANEL WITH GFR
ALT: 10 U/L (ref 0–44)
AST: 22 U/L (ref 15–41)
Albumin: 3.9 g/dL (ref 3.5–5.0)
Alkaline Phosphatase: 105 U/L (ref 38–126)
Anion gap: 11 (ref 5–15)
BUN: 16 mg/dL (ref 8–23)
CO2: 28 mmol/L (ref 22–32)
Calcium: 9 mg/dL (ref 8.9–10.3)
Chloride: 98 mmol/L (ref 98–111)
Creatinine, Ser: 0.87 mg/dL (ref 0.61–1.24)
GFR, Estimated: >60 mL/min (ref >60)
Glucose, Bld: 105 mg/dL — ABNORMAL HIGH (ref 70–99)
Potassium: 4.1 mmol/L (ref 3.5–5.1)
Sodium: 137 mmol/L (ref 135–145)
Total Bilirubin: 1 mg/dL (ref 0.0–1.2)
Total Protein: 7.4 g/dL (ref 6.5–8.1)

## 2024-08-09 LAB — CBC WITH DIFFERENTIAL/PLATELET
Abs Immature Granulocytes: 0.11 K/uL — ABNORMAL HIGH (ref 0.00–0.07)
Basophils Absolute: 0.1 K/uL (ref 0.0–0.1)
Basophils Relative: 0 %
Eosinophils Absolute: 0 K/uL (ref 0.0–0.5)
Eosinophils Relative: 0 %
HCT: 39.6 % (ref 39.0–52.0)
Hemoglobin: 12.8 g/dL — ABNORMAL LOW (ref 13.0–17.0)
Immature Granulocytes: 1 %
Lymphocytes Relative: 5 %
Lymphs Abs: 1 K/uL (ref 0.7–4.0)
MCH: 29.4 pg (ref 26.0–34.0)
MCHC: 32.3 g/dL (ref 30.0–36.0)
MCV: 91 fL (ref 80.0–100.0)
Monocytes Absolute: 1.7 K/uL — ABNORMAL HIGH (ref 0.1–1.0)
Monocytes Relative: 9 %
Neutro Abs: 15.2 K/uL — ABNORMAL HIGH (ref 1.7–7.7)
Neutrophils Relative %: 85 %
Platelets: 172 K/uL (ref 150–400)
RBC: 4.35 MIL/uL (ref 4.22–5.81)
RDW: 13.6 % (ref 11.5–15.5)
WBC: 18 K/uL — ABNORMAL HIGH (ref 4.0–10.5)
nRBC: 0 % (ref 0.0–0.2)

## 2024-08-09 LAB — URINALYSIS, ROUTINE W REFLEX MICROSCOPIC
Bacteria, UA: NONE SEEN
Bilirubin Urine: NEGATIVE
Glucose, UA: NEGATIVE mg/dL
Hgb urine dipstick: NEGATIVE
Ketones, ur: 20 mg/dL — AB
Leukocytes,Ua: NEGATIVE
Nitrite: NEGATIVE
Protein, ur: 100 mg/dL — AB
Specific Gravity, Urine: 1.027 (ref 1.005–1.030)
pH: 5 (ref 5.0–8.0)

## 2024-08-09 LAB — STREP PNEUMONIAE URINARY ANTIGEN: Strep Pneumo Urinary Antigen: NEGATIVE

## 2024-08-09 LAB — LIPASE, BLOOD: Lipase: 16 U/L (ref 11–51)

## 2024-08-09 MED ORDER — SODIUM CHLORIDE 0.9 % IV SOLN
1.0000 g | Freq: Once | INTRAVENOUS | Status: AC
  Administered 2024-08-09: 1 g via INTRAVENOUS
  Filled 2024-08-09: qty 10

## 2024-08-09 MED ORDER — HYDROCODONE-ACETAMINOPHEN 5-325 MG PO TABS: 1.0000 | ORAL_TABLET | ORAL | Status: DC | PRN

## 2024-08-09 MED ORDER — GUAIFENESIN ER 600 MG PO TB12
600.0000 mg | ORAL_TABLET | Freq: Two times a day (BID) | ORAL | Status: DC
  Administered 2024-08-09 – 2024-08-11 (×4): 600 mg via ORAL
  Filled 2024-08-09 (×4): qty 1

## 2024-08-09 MED ORDER — SODIUM CHLORIDE 0.9 % IV SOLN
500.0000 mg | Freq: Once | INTRAVENOUS | Status: AC
  Administered 2024-08-09: 500 mg via INTRAVENOUS
  Filled 2024-08-09: qty 5

## 2024-08-09 MED ORDER — ACETAMINOPHEN 325 MG PO TABS
650.0000 mg | ORAL_TABLET | Freq: Four times a day (QID) | ORAL | Status: DC | PRN
Start: 2024-08-09 — End: 2024-08-11
  Administered 2024-08-10: 650 mg via ORAL
  Filled 2024-08-09: qty 2

## 2024-08-09 MED ORDER — PANTOPRAZOLE SODIUM 40 MG PO TBEC
40.0000 mg | DELAYED_RELEASE_TABLET | Freq: Every day | ORAL | Status: DC
  Administered 2024-08-09 – 2024-08-11 (×3): 40 mg via ORAL
  Filled 2024-08-09 (×3): qty 1

## 2024-08-09 MED ORDER — PRAVASTATIN SODIUM 40 MG PO TABS
40.0000 mg | ORAL_TABLET | Freq: Every day | ORAL | Status: DC
  Administered 2024-08-09 – 2024-08-10 (×2): 40 mg via ORAL
  Filled 2024-08-09 (×2): qty 1

## 2024-08-09 MED ORDER — CEFTRIAXONE SODIUM 1 G IJ SOLR
1.0000 g | Freq: Once | INTRAMUSCULAR | Status: AC
  Administered 2024-08-09: 1 g via INTRAVENOUS
  Filled 2024-08-09: qty 10

## 2024-08-09 MED ORDER — SODIUM CHLORIDE 0.9 % IV SOLN
500.0000 mg | INTRAVENOUS | Status: DC
  Administered 2024-08-10 – 2024-08-11 (×2): 500 mg via INTRAVENOUS
  Filled 2024-08-09 (×2): qty 5

## 2024-08-09 MED ORDER — ONDANSETRON HCL 4 MG/2ML IJ SOLN
4.0000 mg | Freq: Once | INTRAMUSCULAR | Status: AC
  Administered 2024-08-09: 4 mg via INTRAVENOUS
  Filled 2024-08-09: qty 2

## 2024-08-09 MED ORDER — SODIUM CHLORIDE 0.9 % IV SOLN: 500.0000 mg | Freq: Once | INTRAVENOUS | Status: DC

## 2024-08-09 MED ORDER — ALBUTEROL SULFATE (2.5 MG/3ML) 0.083% IN NEBU: 2.5000 mg | INHALATION_SOLUTION | RESPIRATORY_TRACT | Status: DC | PRN

## 2024-08-09 MED ORDER — MORPHINE SULFATE (PF) 4 MG/ML IV SOLN
2.0000 mg | Freq: Once | INTRAVENOUS | Status: AC
  Administered 2024-08-09: 2 mg via INTRAVENOUS
  Filled 2024-08-09: qty 1

## 2024-08-09 MED ORDER — MORPHINE SULFATE (PF) 2 MG/ML IV SOLN: 1.0000 mg | INTRAVENOUS | Status: DC | PRN

## 2024-08-09 MED ORDER — LIDOCAINE 5 % EX PTCH
1.0000 | MEDICATED_PATCH | CUTANEOUS | Status: DC
  Administered 2024-08-09 – 2024-08-11 (×3): 1 via TRANSDERMAL
  Filled 2024-08-09 (×3): qty 1

## 2024-08-09 MED ORDER — DILTIAZEM HCL 30 MG PO TABS
30.0000 mg | ORAL_TABLET | Freq: Two times a day (BID) | ORAL | Status: DC
  Administered 2024-08-09 – 2024-08-11 (×4): 30 mg via ORAL
  Filled 2024-08-09 (×4): qty 1

## 2024-08-09 MED ORDER — HEPARIN SODIUM (PORCINE) 5000 UNIT/ML IJ SOLN
5000.0000 [IU] | Freq: Three times a day (TID) | INTRAMUSCULAR | Status: DC
  Administered 2024-08-09 – 2024-08-11 (×5): 5000 [IU] via SUBCUTANEOUS
  Filled 2024-08-09 (×5): qty 1

## 2024-08-09 MED ORDER — ASPIRIN 81 MG PO CHEW
81.0000 mg | CHEWABLE_TABLET | Freq: Every day | ORAL | Status: DC
  Administered 2024-08-09 – 2024-08-11 (×3): 81 mg via ORAL
  Filled 2024-08-09 (×3): qty 1

## 2024-08-09 MED ORDER — ACETAMINOPHEN 500 MG PO TABS
1000.0000 mg | ORAL_TABLET | Freq: Once | ORAL | Status: AC
  Administered 2024-08-09: 1000 mg via ORAL
  Filled 2024-08-09: qty 2

## 2024-08-09 MED ORDER — SODIUM CHLORIDE 0.9 % IV SOLN
2.0000 g | INTRAVENOUS | Status: DC
  Administered 2024-08-10 – 2024-08-11 (×2): 2 g via INTRAVENOUS
  Filled 2024-08-09 (×2): qty 20

## 2024-08-09 MED ORDER — ACETAMINOPHEN 650 MG RE SUPP: 650.0000 mg | Freq: Four times a day (QID) | RECTAL | Status: DC | PRN

## 2024-08-09 MED ORDER — POLYETHYLENE GLYCOL 3350 17 G PO PACK
17.0000 g | PACK | Freq: Two times a day (BID) | ORAL | Status: AC
Start: 2024-08-09 — End: 2024-08-11
  Administered 2024-08-10: 17 g via ORAL
  Filled 2024-08-09 (×3): qty 1

## 2024-08-09 MED ORDER — HYDRALAZINE HCL 20 MG/ML IJ SOLN: 5.0000 mg | Freq: Four times a day (QID) | INTRAMUSCULAR | Status: DC | PRN

## 2024-08-09 MED ORDER — IOHEXOL 350 MG/ML SOLN
100.0000 mL | Freq: Once | INTRAVENOUS | Status: AC | PRN
  Administered 2024-08-09: 100 mL via INTRAVENOUS

## 2024-08-09 MED ORDER — SODIUM CHLORIDE 0.9 % IV SOLN: INTRAVENOUS | Status: AC

## 2024-08-09 NOTE — ED Triage Notes (Signed)
 Pt c/o right flank pain since yesterday right under his ribs. Pt went to UC yesterday and they dx him with pneumonia.

## 2024-08-09 NOTE — Plan of Care (Signed)
   Problem: Activity: Goal: Risk for activity intolerance will decrease Outcome: Progressing   Problem: Coping: Goal: Level of anxiety will decrease Outcome: Progressing

## 2024-08-09 NOTE — ED Provider Notes (Signed)
 [facility] Provider Note   CSN: [id number] Arrival date & time: 08/09/24  9170     Patient presents with: Flank Pain   [name] is a 83 y.o. male with PMHx OA, HLD, HTN, CAD, GERD, IDA who presents to ED concerned for RUQ/right rib pain since yesterday. Patient stating that he has been coughing a lot since Sunday. Low grade fever yesterday. Denies any worsening of SOB recently. Also had some nausea last night which has since resolved. Last BM today.   Patient seen at [facility] yesterday and diagnosed with PNA.  Denies fever, chest pain, dyspnea, cough, nausea, vomiting, diarrhea, dysuria, hematuria, hematochezia.     Flank Pain       Prior to Admission medications   Medication Sig Start Date End Date Taking? Authorizing Provider  albuterol  (VENTOLIN  HFA) 108 (90 Base) MCG/ACT inhaler Inhale 2 puffs into the lungs every 4 (four) hours as needed for wheezing. 08/13/21  Yes [name], Glendia LABOR, MD  Ascorbic Acid  (VITAMIN C ) 1000 MG tablet Take 1,000 mg by mouth daily.   Yes [provider]  aspirin  81 MG tablet Take 1 tablet (81 mg total) by mouth daily. 02/28/18  Yes Rehman, Claudis PENNER, MD  cetirizine  (ZYRTEC  ALLERGY) 10 MG tablet Take 1 tablet (10 mg total) by mouth daily. 10/26/21  Yes Stuart Vernell Norris, PA-C  Cholecalciferol (VITAMIN D3 PO) Take 1 tablet by mouth daily.   Yes [provider]  diltiazem  (CARDIZEM ) 30 MG tablet Take 1 tablet (30 mg total) by mouth every 12 (twelve) hours. 07/11/24  Yes [name], Glendia LABOR, MD  fish oil-omega-3 fatty acids 1000 MG capsule Take 1 g by mouth daily.    Yes [provider]  ketoconazole  (NIZORAL ) 2 % cream Apply topically 2 (two) times daily as needed for irritation. 07/11/24  Yes Alphonsa Glendia LABOR, MD  meclizine  (ANTIVERT ) 25 MG tablet Take 1 tablet (25 mg total) by mouth 3 (three) times daily as needed for dizziness. 09/19/21  Yes Tegeler, Lonni PARAS, MD  Multiple  Vitamins-Minerals (PRESERVISION AREDS 2 PO) Take 1 capsule by mouth 2 (two) times daily.   Yes [provider]  pantoprazole  (PROTONIX ) 40 MG tablet TAKE 1 TABLET BY MOUTH ONCE DAILY 30 MINUTES BEFORE BREAKFAST 07/11/24  Yes [name], Glendia LABOR, MD  pravastatin  (PRAVACHOL ) 40 MG tablet Take 1 tablet (40 mg total) by mouth daily. 07/11/24  Yes [name], Glendia LABOR, MD  Pseudoeph-Doxylamine-DM-APAP (NYQUIL PO) Take 15 mLs by mouth as needed (cold and cough).   Yes [provider]  amoxicillin -clavulanate (AUGMENTIN ) 875-125 MG tablet Take 1 tablet by mouth 2 (two) times daily. 08/08/24   [provider]  azithromycin  (ZITHROMAX ) 250 MG tablet Take 250 mg by mouth as directed. 08/08/24   [provider]  budesonide -formoterol  (BREYNA ) 160-4.5 MCG/ACT inhaler INHALE 2 PUFFS BY MOUTH TWICE DAILY -  RINSE  MOUTH  WITH  WATER   AFTER  EACH  USE Patient not taking: Reported on 08/09/2024 01/04/23   Alphonsa Glendia LABOR, MD    Allergies: Patient has no known allergies.    Review of Systems  Genitourinary:  Positive for flank pain.    Updated Vital Signs BP (!) 122/49   Pulse (!) 56   Temp 99.3 F (37.4 C) (Oral)   Resp 17   Ht 5' 7 (1.702 m)   Wt 54.4 kg   SpO2 96%   BMI 18.79 kg/m   Physical Exam Vitals and nursing note  reviewed.  Constitutional:      General: He is not in acute distress.    Appearance: He is not ill-appearing or toxic-appearing.  HENT:     Head: Normocephalic and atraumatic.     Mouth/Throat:     Mouth: Mucous membranes are moist.  Eyes:     General: No scleral icterus.       Right eye: No discharge.        Left eye: No discharge.     Conjunctiva/sclera: Conjunctivae normal.  Cardiovascular:     Rate and Rhythm: Normal rate and regular rhythm.     Pulses: Normal pulses.     Heart sounds: Normal heart sounds. No murmur heard. Pulmonary:     Effort: Pulmonary effort is normal. No respiratory distress.     Breath sounds: Normal breath sounds.  No wheezing, rhonchi or rales.  Abdominal:     General: Abdomen is flat. Bowel sounds are normal. There is no distension.     Palpations: Abdomen is soft. There is no mass.     Tenderness: There is abdominal tenderness.     Comments: Right lower ribs/high RUQ tenderness to palpation  Musculoskeletal:     Right lower leg: No edema.     Left lower leg: No edema.  Skin:    General: Skin is warm and dry.     Findings: No rash.  Neurological:     General: No focal deficit present.     Mental Status: He is alert and oriented to person, place, and time. Mental status is at baseline.  Psychiatric:        Mood and Affect: Mood normal.     (all labs ordered are listed, but only abnormal results are displayed) Labs Reviewed  URINALYSIS, ROUTINE W REFLEX MICROSCOPIC - Abnormal; Notable for the following components:      Result Value   Ketones, ur 20 (*)    Protein, ur 100 (*)    All other components within normal limits  COMPREHENSIVE METABOLIC PANEL WITH GFR - Abnormal; Notable for the following components:   Glucose, Bld 105 (*)    All other components within normal limits  CBC WITH DIFFERENTIAL/PLATELET - Abnormal; Notable for the following components:   WBC 18.0 (*)    Hemoglobin 12.8 (*)    Neutro Abs 15.2 (*)    Monocytes Absolute 1.7 (*)    Abs Immature Granulocytes 0.11 (*)    All other components within normal limits  EXPECTORATED SPUTUM ASSESSMENT W GRAM STAIN, RFLX TO RESP C  CULTURE, BLOOD (ROUTINE X 2)  CULTURE, BLOOD (ROUTINE X 2)  LIPASE, BLOOD  STREP PNEUMONIAE URINARY ANTIGEN  LEGIONELLA PNEUMOPHILA SEROGP 1 UR AG    EKG: None  Radiology: CT ABDOMEN PELVIS W CONTRAST Result Date: 08/09/2024 CLINICAL DATA:  Right-sided chest pain. Concern for pulmonary embolism. Abdominal pain. EXAM: CT ANGIOGRAPHY CHEST CT ABDOMEN AND PELVIS WITH CONTRAST TECHNIQUE: Multidetector CT imaging of the chest was performed using the standard protocol during bolus administration of  intravenous contrast. Multiplanar CT image reconstructions and MIPs were obtained to evaluate the vascular anatomy. Multidetector CT imaging of the abdomen and pelvis was performed using the standard protocol during bolus administration of intravenous contrast. RADIATION DOSE REDUCTION: This exam was performed according to the departmental dose-optimization program which includes automated exposure control, adjustment of the mA and/or kV according to patient size and/or use of iterative reconstruction technique. CONTRAST:  OMNIPAQUE  IOHEXOL  350 MG/ML SOLN COMPARISON:  Chest CT dated 11/14/2023. CT  abdomen pelvis dated 04/09/2024. FINDINGS: CTA CHEST FINDINGS Cardiovascular: There is mild cardiomegaly. No pericardial effusion. There is coronary vascular calcification. Moderate atherosclerotic calcification of the thoracic aorta. No aneurysmal dilatation or dissection. The origins of the great vessels of the aortic arch are patent. No pulmonary artery embolus identified. Mediastinum/Nodes: Bilateral hilar and mediastinal adenopathy measure 18 mm in the subcarinal region and 15 mm in the right hilum and overall slightly increased since the prior CT, likely reactive. The esophagus is grossly unremarkable. No mediastinal fluid collection. Lungs/Pleura: Background of interstitial lung disease with bibasilar subpleural reticulation and honeycombing. Patchy area of consolidation in the right lung base concerning for pneumonia. There is additional nodular airspace density anterior to the right hilum as well as additional smaller scattered ground-glass density, likely inflammatory/infectious in etiology. Clinical correlation and follow-up recommended. Stable 13 mm nodule at the left lung base previously described as hamartoma. There is bronchial thickening and impaction in the right lower lobe. Small right pleural effusion. No pneumothorax. The central airways are patent. Musculoskeletal: Median sternotomy wires. No  acute osseous pathology. Review of the MIP images confirms the above findings. CT ABDOMEN and PELVIS FINDINGS No intra-abdominal free air or free fluid. Hepatobiliary: The liver is unremarkable. No biliary dilatation. The gallbladder is unremarkable. Pancreas: Unremarkable. No pancreatic ductal dilatation or surrounding inflammatory changes. Spleen: Normal in size without focal abnormality. Adrenals/Urinary Tract: The adrenal glands unremarkable. The kidneys, visualized ureters, and urinary bladder appear unremarkable. Stomach/Bowel: There is redundancy of the sigmoid colon. There is sigmoid diverticulosis and scattered colonic diverticula. There is no bowel obstruction or active inflammation. The appendix is normal. Vascular/Lymphatic: Moderate aortoiliac atherosclerotic disease. The IVC is unremarkable. No portal gas. There is no adenopathy. Reproductive: Mildly enlarged prostate gland measuring approximately 5 cm in transverse axial diameter. Other: None Musculoskeletal: Degenerative changes of spine. No acute osseous pathology. Review of the MIP images confirms the above findings. IMPRESSION: 1. No CT evidence of pulmonary artery embolus. 2. Right lower lobe pneumonia with small right pleural effusion. Follow-up to resolution recommended. 3. Background of interstitial lung disease. 4. No acute intra-abdominal or pelvic pathology. 5. Colonic diverticulosis. No bowel obstruction. Normal appendix. 6.  Aortic Atherosclerosis (ICD10-I70.0). Electronically Signed   By: Vanetta Chou M.D.   On: 08/09/2024 13:55   CT Angio Chest PE W/Cm &/Or Wo Cm Result Date: 08/09/2024 CLINICAL DATA:  Right-sided chest pain. Concern for pulmonary embolism. Abdominal pain. EXAM: CT ANGIOGRAPHY CHEST CT ABDOMEN AND PELVIS WITH CONTRAST TECHNIQUE: Multidetector CT imaging of the chest was performed using the standard protocol during bolus administration of intravenous contrast. Multiplanar CT image reconstructions and MIPs were  obtained to evaluate the vascular anatomy. Multidetector CT imaging of the abdomen and pelvis was performed using the standard protocol during bolus administration of intravenous contrast. RADIATION DOSE REDUCTION: This exam was performed according to the departmental dose-optimization program which includes automated exposure control, adjustment of the mA and/or kV according to patient size and/or use of iterative reconstruction technique. CONTRAST:  OMNIPAQUE  IOHEXOL  350 MG/ML SOLN COMPARISON:  Chest CT dated 11/14/2023. CT abdomen pelvis dated 04/09/2024. FINDINGS: CTA CHEST FINDINGS Cardiovascular: There is mild cardiomegaly. No pericardial effusion. There is coronary vascular calcification. Moderate atherosclerotic calcification of the thoracic aorta. No aneurysmal dilatation or dissection. The origins of the great vessels of the aortic arch are patent. No pulmonary artery embolus identified. Mediastinum/Nodes: Bilateral hilar and mediastinal adenopathy measure 18 mm in the subcarinal region and 15 mm in the right hilum and overall slightly  increased since the prior CT, likely reactive. The esophagus is grossly unremarkable. No mediastinal fluid collection. Lungs/Pleura: Background of interstitial lung disease with bibasilar subpleural reticulation and honeycombing. Patchy area of consolidation in the right lung base concerning for pneumonia. There is additional nodular airspace density anterior to the right hilum as well as additional smaller scattered ground-glass density, likely inflammatory/infectious in etiology. Clinical correlation and follow-up recommended. Stable 13 mm nodule at the left lung base previously described as hamartoma. There is bronchial thickening and impaction in the right lower lobe. Small right pleural effusion. No pneumothorax. The central airways are patent. Musculoskeletal: Median sternotomy wires. No acute osseous pathology. Review of the MIP images confirms the above  findings. CT ABDOMEN and PELVIS FINDINGS No intra-abdominal free air or free fluid. Hepatobiliary: The liver is unremarkable. No biliary dilatation. The gallbladder is unremarkable. Pancreas: Unremarkable. No pancreatic ductal dilatation or surrounding inflammatory changes. Spleen: Normal in size without focal abnormality. Adrenals/Urinary Tract: The adrenal glands unremarkable. The kidneys, visualized ureters, and urinary bladder appear unremarkable. Stomach/Bowel: There is redundancy of the sigmoid colon. There is sigmoid diverticulosis and scattered colonic diverticula. There is no bowel obstruction or active inflammation. The appendix is normal. Vascular/Lymphatic: Moderate aortoiliac atherosclerotic disease. The IVC is unremarkable. No portal gas. There is no adenopathy. Reproductive: Mildly enlarged prostate gland measuring approximately 5 cm in transverse axial diameter. Other: None Musculoskeletal: Degenerative changes of spine. No acute osseous pathology. Review of the MIP images confirms the above findings. IMPRESSION: 1. No CT evidence of pulmonary artery embolus. 2. Right lower lobe pneumonia with small right pleural effusion. Follow-up to resolution recommended. 3. Background of interstitial lung disease. 4. No acute intra-abdominal or pelvic pathology. 5. Colonic diverticulosis. No bowel obstruction. Normal appendix. 6.  Aortic Atherosclerosis (ICD10-I70.0). Electronically Signed   By: Vanetta Chou M.D.   On: 08/09/2024 13:55     Procedures   Medications Ordered in the ED  lidocaine  (LIDODERM ) 5 % 1 patch (1 patch Transdermal Patch Applied 08/09/24 0930)  cefTRIAXone  (ROCEPHIN ) 1 g in sodium chloride  0.9 % 100 mL IVPB (has no administration in time range)  cefTRIAXone  (ROCEPHIN ) 2 g in sodium chloride  0.9 % 100 mL IVPB (has no administration in time range)  azithromycin  (ZITHROMAX ) 500 mg in sodium chloride  0.9 % 250 mL IVPB (has no administration in time range)  acetaminophen  (TYLENOL )  tablet 1,000 mg (1,000 mg Oral Given 08/09/24 0930)  iohexol  (OMNIPAQUE ) 350 MG/ML injection 100 mL (100 mLs Intravenous Contrast Given 08/09/24 1117)  cefTRIAXone  (ROCEPHIN ) 1 g in sodium chloride  0.9 % 100 mL IVPB (0 g Intravenous Stopped 08/09/24 1513)  azithromycin  (ZITHROMAX ) 500 mg in sodium chloride  0.9 % 250 mL IVPB (0 mg Intravenous Stopped 08/09/24 1619)  morphine  (PF) 4 MG/ML injection 2 mg (2 mg Intravenous Given 08/09/24 1513)  ondansetron  (ZOFRAN ) injection 4 mg (4 mg Intravenous Given 08/09/24 1513)                                    Medical Decision Making Amount and/or Complexity of Data Reviewed Labs: ordered. Radiology: ordered.  Risk OTC drugs. Prescription drug management.    This patient presents to the ED for concern of abdominal pain, this involves an extensive number of treatment options, and is a complaint that carries with it a high risk of complications and morbidity.  The differential diagnosis includes gastroenteritis, colitis, small bowel obstruction, appendicitis, cholecystitis, pancreatitis, nephrolithiasis, UTI, pyelonephritis  Co morbidities that complicate the patient evaluation  OA, HLD, HTN, CAD, GERD, IDA   Additional history obtained:  Dr. Alphonsa PCP   Problem List / ED Course / Critical interventions / Medication management  Patient presents to ED concern for right lower chest/RUQ pain developing over the past 24 hours.  Was diagnosed with pneumonia yesterday.  Patient and family member stating that this pain is making his ADLs difficult.  Physical exam reassuring.  Patient afebrile with stable vitals. I Ordered, and personally interpreted labs.  UA not concerning for infection.  CBC with leukocytosis at 18.0.  There is mild anemia with hemoglobin at 12.8.  CMP with mild hyperglycemia at 105.  Lipase within normal limits. I ordered imaging studies including CT Abd/Pelvis and CTA chest. I independently visualized and interpreted imaging  and I agree with the radiologist interpretation of right lower lobe pneumonia with small pleural effusion. Shared results with patient.  Answered all questions.  Started patient on IV antibiotics and pain control.  Morphine  and Tylenol  not managing pain well.  Patient stating that he does not think he be able to care for himself well at home due to this pain.  Will plan for admission.  Patient agreeable to plan. I requested consultation with the hospitalist on-call Dr. Tressa,  and discussed lab and imaging findings as well as pertinent plan - they agree to admit patient. I have reviewed the patients home medicines and have made adjustments as needed The patient has been appropriately medically screened and/or stabilized in the ED. I have low suspicion for any other emergent medical condition which would require further screening, evaluation or treatment in the ED or require inpatient management. At time of discharge the patient is hemodynamically stable and in no acute distress. I have discussed work-up results and diagnosis with patient and answered all questions. Patient is agreeable with discharge plan. We discussed strict return precautions for returning to the emergency department and they verbalized understanding.     Social Determinants of Health:  geriatric        Final diagnoses:  Pneumonia of right lower lobe due to infectious organism    ED Discharge Orders     None          Hoy Nidia FALCON, [place] 08/09/24 1624    Patsey Lot, MD 08/10/24 [phone]

## 2024-08-09 NOTE — Plan of Care (Signed)
   Problem: Education: Goal: Knowledge of General Education information will improve Description Including pain rating scale, medication(s)/side effects and non-pharmacologic comfort measures Outcome: Progressing   Problem: Health Behavior/Discharge Planning: Goal: Ability to manage health-related needs will improve Outcome: Progressing

## 2024-08-09 NOTE — H&P (Signed)
 [facility] H&P   Patient Demographics:    Jason Spence, is a 83 y.o. male  MRN: [id number]   DOB - Feb 20, 1941  Admit Date - 08/09/2024  Outpatient Primary MD for the patient is Alphonsa Glendia LABOR, MD  Referring MD/NP/PA: DORISTINE Mays  Outpatient Specialists: [facility]  Patient coming from: Home  Chief Complaint  Patient presents with   Flank Pain      HPI:    Jason Spence  is a 83 y.o. male, with a history of hypertension, coronary disease, pulmonary fibrosis, GERD, hyperlipidemia. - Patient presents to ED secondary to complaints of shortness of breath, cough, fatigue, low-grade temperature, ongoing for last 4 days since Sunday, patient was seen at the urgent care recently where he was diagnosed with pneumonia prescribed Augmentin  and Zithromycin, but did not receive any oral antibiotics yet that he was discharged late yesterday from urgent care and pharmacy was closed, he presents to ED due to worsening dyspnea and right-sided chest pain, as well he reports nausea. - In ED his CTA chest negative for PE but significant for right lower lung pneumonia, no acute finding in abdomen or/pelvis, blood cell count elevated at 18K, received IV antibiotics and [facility] consulted to admit  Review of systems:     A full 10 point Review of Systems was done, except as stated above, all other Review of Systems were negative.   With Past History of the following :    Past Medical History:  Diagnosis Date   Anal stricture 10/18/2006   Arthritis    Hypercholesteremia    Hypertension    Myocardial infarct [facility])       Past Surgical History:  Procedure Laterality Date   BALLOON DILATION N/A 01/05/2013   Procedure: BALLOON DILATION;  Surgeon: Claudis RAYMOND Rivet, MD;  [facility];  Service: Endoscopy;  Laterality: N/A;   BIOPSY  02/27/2018   Procedure: BIOPSY;   Surgeon: Rivet Claudis RAYMOND, MD;  [facility];  Service: Endoscopy;;  esophagus   BIOPSY  08/05/2022   Procedure: BIOPSY;  Surgeon: Eartha Angelia Sieving, MD;  [facility];  Service: Gastroenterology;;   COLONOSCOPY WITH ESOPHAGOGASTRODUODENOSCOPY (EGD) N/A 01/05/2013   Procedure: COLONOSCOPY WITH ESOPHAGOGASTRODUODENOSCOPY (EGD);  Surgeon: Claudis RAYMOND Rivet, MD;  [facility];  Service: Endoscopy;  Laterality: N/A;  100   COLONOSCOPY WITH PROPOFOL  N/A 08/05/2022   Procedure: COLONOSCOPY WITH PROPOFOL ;  Surgeon: Eartha Angelia Sieving, MD;  [facility];  Service: Gastroenterology;  Laterality: N/A;  1030 ASA 3   CORONARY ARTERY BYPASS GRAFT     ESOPHAGEAL DILATION N/A 02/27/2018   Procedure: ESOPHAGEAL DILATION;  Surgeon: Rivet Claudis RAYMOND, MD;  [facility];  Service: Endoscopy;  Laterality: N/A;   ESOPHAGOGASTRODUODENOSCOPY N/A 02/27/2018   Procedure: ESOPHAGOGASTRODUODENOSCOPY (EGD);  Surgeon: Rivet Claudis RAYMOND, MD;  [facility];  Service: Endoscopy;  Laterality: N/A;  12:25   ESOPHAGOGASTRODUODENOSCOPY (EGD) WITH PROPOFOL  N/A 08/05/2022   Procedure: ESOPHAGOGASTRODUODENOSCOPY (EGD) WITH PROPOFOL ;  Surgeon: Eartha Angelia Sieving, MD;  [facility];  Service: Gastroenterology;  Laterality: N/A;   GIVENS CAPSULE STUDY N/A 08/24/2022   Procedure: GIVENS CAPSULE STUDY;  Surgeon: Eartha Angelia Sieving, MD;  [facility];  Service: Gastroenterology;  Laterality: N/A;  730 am   HERNIA REPAIR     POLYPECTOMY  08/05/2022   Procedure: POLYPECTOMY;  Surgeon: Eartha Angelia, Sieving, MD;  [facility];  Service: Gastroenterology;;      Social History:     Social History   Tobacco Use   Smoking status: Former    Current packs/day: 0.00    Types: Cigarettes    Quit date: 08/04/1995    Years since quitting: 29.0    Passive exposure: Past   Smokeless tobacco: Never  Substance Use Topics    Alcohol use: Not Currently    Comment: rare        Family History :     Family History  Problem Relation Age of Onset   Hypertension Sister       Home Medications:   Prior to Admission medications   Medication Sig Start Date End Date Taking? Authorizing Provider  albuterol  (VENTOLIN  HFA) 108 (90 Base) MCG/ACT inhaler Inhale 2 puffs into the lungs every 4 (four) hours as needed for wheezing. 08/13/21  Yes [name], Glendia LABOR, MD  Ascorbic Acid  (VITAMIN C ) 1000 MG tablet Take 1,000 mg by mouth daily.   Yes [provider]  aspirin  81 MG tablet Take 1 tablet (81 mg total) by mouth daily. 02/28/18  Yes Rehman, Claudis PENNER, MD  cetirizine  (ZYRTEC  ALLERGY) 10 MG tablet Take 1 tablet (10 mg total) by mouth daily. 10/26/21  Yes Stuart Vernell Norris, PA-C  Cholecalciferol (VITAMIN D3 PO) Take 1 tablet by mouth daily.   Yes [provider]  diltiazem  (CARDIZEM ) 30 MG tablet Take 1 tablet (30 mg total) by mouth every 12 (twelve) hours. 07/11/24  Yes [name], Glendia LABOR, MD  fish oil-omega-3 fatty acids 1000 MG capsule Take 1 g by mouth daily.    Yes [provider]  ketoconazole  (NIZORAL ) 2 % cream Apply topically 2 (two) times daily as needed for irritation. 07/11/24  Yes Alphonsa Glendia LABOR, MD  meclizine  (ANTIVERT ) 25 MG tablet Take 1 tablet (25 mg total) by mouth 3 (three) times daily as needed for dizziness. 09/19/21  Yes Tegeler, Lonni PARAS, MD  Multiple Vitamins-Minerals (PRESERVISION AREDS 2 PO) Take 1 capsule by mouth 2 (two) times daily.   Yes [provider]  pantoprazole  (PROTONIX ) 40 MG tablet TAKE 1 TABLET BY MOUTH ONCE DAILY 30 MINUTES BEFORE BREAKFAST 07/11/24  Yes [name], Glendia LABOR, MD  pravastatin  (PRAVACHOL ) 40 MG tablet Take 1 tablet (40 mg total) by mouth daily. 07/11/24  Yes [name], Glendia LABOR, MD  Pseudoeph-Doxylamine-DM-APAP (NYQUIL PO) Take 15 mLs by mouth as needed (cold and cough).   Yes [provider]  amoxicillin -clavulanate (AUGMENTIN )  875-125 MG tablet Take 1 tablet by mouth 2 (two) times daily. 08/08/24   [provider]  azithromycin  (ZITHROMAX ) 250 MG tablet Take 250 mg by mouth as directed. 08/08/24   [provider]  budesonide -formoterol  (BREYNA ) 160-4.5 MCG/ACT inhaler INHALE 2 PUFFS BY MOUTH TWICE DAILY -  RINSE  MOUTH  WITH  WATER   AFTER  EACH  USE Patient not taking: Reported on 08/09/2024 01/04/23  Alphonsa Glendia LABOR, MD     Allergies:    No Known Allergies   Physical Exam:   Vitals  Blood pressure (!) 122/49, pulse (!) 56, temperature 99.3 F (37.4 C), temperature source Oral, resp. rate 17, height 5' 7 (1.702 m), weight 54.4 kg, SpO2 96%.   1. General Frail, deconditioned, ill-appearing  2. Normal affect and insight, Not Suicidal or Homicidal, Awake Alert, Oriented X 3.  3. No F.N deficits, ALL C.Nerves Intact, Strength 5/5 all 4 extremities, Sensation intact all 4 extremities, Plantars down going.  4. Ears and Eyes appear Normal, Conjunctivae clear, PERRLA. Moist Oral Mucosa.  5. Supple Neck, No JVD, No cervical lymphadenopathy appriciated, No Carotid Bruits.  6. Symmetrical Chest wall movement, air entry at right lung base, with rhonchi/Rales in right lung  7. RRR, No Gallops, Rubs or Murmurs, No Parasternal Heave.  8. Positive Bowel Sounds, Abdomen Soft, No tenderness, No organomegaly appriciated,No rebound -guarding or rigidity.  9.  No Cyanosis, Normal Skin Turgor, No Skin Rash or Bruise.  10. Good muscle tone,  joints appear normal , no effusions, Normal ROM.    Data Review:    CBC Recent Labs  Lab 08/09/24 0930  WBC 18.0*  HGB 12.8*  HCT 39.6  PLT 172  MCV 91.0  MCH 29.4  MCHC 32.3  RDW 13.6  LYMPHSABS 1.0  MONOABS 1.7*  EOSABS 0.0  BASOSABS 0.1   ------------------------------------------------------------------------------------------------------------------  Chemistries  Recent Labs  Lab 08/09/24 0930  NA 137  K 4.1  CL 98  CO2 28  GLUCOSE  105*  BUN 16  CREATININE 0.87  CALCIUM 9.0  AST 22  ALT 10  ALKPHOS 105  BILITOT 1.0   ------------------------------------------------------------------------------------------------------------------ estimated creatinine clearance is 48.6 mL/min (by C-G formula based on SCr of 0.87 mg/dL). ------------------------------------------------------------------------------------------------------------------ No results for input(s): TSH, T4TOTAL, T3FREE, THYROIDAB in the last 72 hours.  Invalid input(s): FREET3  Coagulation profile No results for input(s): INR, PROTIME in the last 168 hours. ------------------------------------------------------------------------------------------------------------------- No results for input(s): DDIMER in the last 72 hours. -------------------------------------------------------------------------------------------------------------------  Cardiac Enzymes No results for input(s): CKMB, TROPONINI, MYOGLOBIN in the last 168 hours.  Invalid input(s): CK ------------------------------------------------------------------------------------------------------------------    Component Value Date/Time   BNP 383.0 (H) 11/14/2023 2108     ---------------------------------------------------------------------------------------------------------------  Urinalysis    Component Value Date/Time   COLORURINE YELLOW 08/09/2024 1009   APPEARANCEUR CLEAR 08/09/2024 1009   LABSPEC 1.027 08/09/2024 1009   PHURINE 5.0 08/09/2024 1009   GLUCOSEU NEGATIVE 08/09/2024 1009   HGBUR NEGATIVE 08/09/2024 1009   BILIRUBINUR NEGATIVE 08/09/2024 1009   KETONESUR 20 (A) 08/09/2024 1009   PROTEINUR 100 (A) 08/09/2024 1009   UROBILINOGEN 0.2 08/02/2012 2008   NITRITE NEGATIVE 08/09/2024 1009   LEUKOCYTESUR NEGATIVE 08/09/2024 1009    ----------------------------------------------------------------------------------------------------------------    Imaging Results:    CT ABDOMEN PELVIS W CONTRAST Result Date: 08/09/2024 CLINICAL DATA:  Right-sided chest pain. Concern for pulmonary embolism. Abdominal pain. EXAM: CT ANGIOGRAPHY CHEST CT ABDOMEN AND PELVIS WITH CONTRAST TECHNIQUE: Multidetector CT imaging of the chest was performed using the standard protocol during bolus administration of intravenous contrast. Multiplanar CT image reconstructions and MIPs were obtained to evaluate the vascular anatomy. Multidetector CT imaging of the abdomen and pelvis was performed using the standard protocol during bolus administration of intravenous contrast. RADIATION DOSE REDUCTION: This exam was performed according to the departmental dose-optimization program which includes automated exposure control, adjustment of the mA and/or kV according to patient size and/or use of iterative reconstruction  technique. CONTRAST:  OMNIPAQUE  IOHEXOL  350 MG/ML SOLN COMPARISON:  Chest CT dated 11/14/2023. CT abdomen pelvis dated 04/09/2024. FINDINGS: CTA CHEST FINDINGS Cardiovascular: There is mild cardiomegaly. No pericardial effusion. There is coronary vascular calcification. Moderate atherosclerotic calcification of the thoracic aorta. No aneurysmal dilatation or dissection. The origins of the great vessels of the aortic arch are patent. No pulmonary artery embolus identified. Mediastinum/Nodes: Bilateral hilar and mediastinal adenopathy measure 18 mm in the subcarinal region and 15 mm in the right hilum and overall slightly increased since the prior CT, likely reactive. The esophagus is grossly unremarkable. No mediastinal fluid collection. Lungs/Pleura: Background of interstitial lung disease with bibasilar subpleural reticulation and honeycombing. Patchy area of consolidation in the right lung base concerning for pneumonia. There is additional nodular airspace density anterior to the right hilum as well as additional smaller scattered ground-glass density, likely  inflammatory/infectious in etiology. Clinical correlation and follow-up recommended. Stable 13 mm nodule at the left lung base previously described as hamartoma. There is bronchial thickening and impaction in the right lower lobe. Small right pleural effusion. No pneumothorax. The central airways are patent. Musculoskeletal: Median sternotomy wires. No acute osseous pathology. Review of the MIP images confirms the above findings. CT ABDOMEN and PELVIS FINDINGS No intra-abdominal free air or free fluid. Hepatobiliary: The liver is unremarkable. No biliary dilatation. The gallbladder is unremarkable. Pancreas: Unremarkable. No pancreatic ductal dilatation or surrounding inflammatory changes. Spleen: Normal in size without focal abnormality. Adrenals/Urinary Tract: The adrenal glands unremarkable. The kidneys, visualized ureters, and urinary bladder appear unremarkable. Stomach/Bowel: There is redundancy of the sigmoid colon. There is sigmoid diverticulosis and scattered colonic diverticula. There is no bowel obstruction or active inflammation. The appendix is normal. Vascular/Lymphatic: Moderate aortoiliac atherosclerotic disease. The IVC is unremarkable. No portal gas. There is no adenopathy. Reproductive: Mildly enlarged prostate gland measuring approximately 5 cm in transverse axial diameter. Other: None Musculoskeletal: Degenerative changes of spine. No acute osseous pathology. Review of the MIP images confirms the above findings. IMPRESSION: 1. No CT evidence of pulmonary artery embolus. 2. Right lower lobe pneumonia with small right pleural effusion. Follow-up to resolution recommended. 3. Background of interstitial lung disease. 4. No acute intra-abdominal or pelvic pathology. 5. Colonic diverticulosis. No bowel obstruction. Normal appendix. 6.  Aortic Atherosclerosis (ICD10-I70.0). Electronically Signed   By: Vanetta Chou M.D.   On: 08/09/2024 13:55   CT Angio Chest PE W/Cm &/Or Wo Cm Result Date:  08/09/2024 CLINICAL DATA:  Right-sided chest pain. Concern for pulmonary embolism. Abdominal pain. EXAM: CT ANGIOGRAPHY CHEST CT ABDOMEN AND PELVIS WITH CONTRAST TECHNIQUE: Multidetector CT imaging of the chest was performed using the standard protocol during bolus administration of intravenous contrast. Multiplanar CT image reconstructions and MIPs were obtained to evaluate the vascular anatomy. Multidetector CT imaging of the abdomen and pelvis was performed using the standard protocol during bolus administration of intravenous contrast. RADIATION DOSE REDUCTION: This exam was performed according to the departmental dose-optimization program which includes automated exposure control, adjustment of the mA and/or kV according to patient size and/or use of iterative reconstruction technique. CONTRAST:  OMNIPAQUE  IOHEXOL  350 MG/ML SOLN COMPARISON:  Chest CT dated 11/14/2023. CT abdomen pelvis dated 04/09/2024. FINDINGS: CTA CHEST FINDINGS Cardiovascular: There is mild cardiomegaly. No pericardial effusion. There is coronary vascular calcification. Moderate atherosclerotic calcification of the thoracic aorta. No aneurysmal dilatation or dissection. The origins of the great vessels of the aortic arch are patent. No pulmonary artery embolus identified. Mediastinum/Nodes: Bilateral hilar and mediastinal adenopathy measure  18 mm in the subcarinal region and 15 mm in the right hilum and overall slightly increased since the prior CT, likely reactive. The esophagus is grossly unremarkable. No mediastinal fluid collection. Lungs/Pleura: Background of interstitial lung disease with bibasilar subpleural reticulation and honeycombing. Patchy area of consolidation in the right lung base concerning for pneumonia. There is additional nodular airspace density anterior to the right hilum as well as additional smaller scattered ground-glass density, likely inflammatory/infectious in etiology. Clinical correlation and follow-up  recommended. Stable 13 mm nodule at the left lung base previously described as hamartoma. There is bronchial thickening and impaction in the right lower lobe. Small right pleural effusion. No pneumothorax. The central airways are patent. Musculoskeletal: Median sternotomy wires. No acute osseous pathology. Review of the MIP images confirms the above findings. CT ABDOMEN and PELVIS FINDINGS No intra-abdominal free air or free fluid. Hepatobiliary: The liver is unremarkable. No biliary dilatation. The gallbladder is unremarkable. Pancreas: Unremarkable. No pancreatic ductal dilatation or surrounding inflammatory changes. Spleen: Normal in size without focal abnormality. Adrenals/Urinary Tract: The adrenal glands unremarkable. The kidneys, visualized ureters, and urinary bladder appear unremarkable. Stomach/Bowel: There is redundancy of the sigmoid colon. There is sigmoid diverticulosis and scattered colonic diverticula. There is no bowel obstruction or active inflammation. The appendix is normal. Vascular/Lymphatic: Moderate aortoiliac atherosclerotic disease. The IVC is unremarkable. No portal gas. There is no adenopathy. Reproductive: Mildly enlarged prostate gland measuring approximately 5 cm in transverse axial diameter. Other: None Musculoskeletal: Degenerative changes of spine. No acute osseous pathology. Review of the MIP images confirms the above findings. IMPRESSION: 1. No CT evidence of pulmonary artery embolus. 2. Right lower lobe pneumonia with small right pleural effusion. Follow-up to resolution recommended. 3. Background of interstitial lung disease. 4. No acute intra-abdominal or pelvic pathology. 5. Colonic diverticulosis. No bowel obstruction. Normal appendix. 6.  Aortic Atherosclerosis (ICD10-I70.0). Electronically Signed   By: Vanetta Chou M.D.   On: 08/09/2024 13:55       Assessment & Plan:    Principal Problem:   Pneumonia Active Problems:   Essential hypertension, benign   GERD  (gastroesophageal reflux disease)   Pulmonary fibrosis (HCC)   S/P CABG (coronary artery bypass graft)   Right lower lobe pneumonia pneumonia -Dyspnea, cough, musculoskeletal chest pain, leukocytosis -CTA chest negative for PE but significant for right lobar pneumonia -Continue with IV Rocephin  and azithromycin  -Follow blood cultures, sputum cultures, Legionella and strep pneumonia antigen -Was encouraged use incentive spirometry and flutter valve has high risk for worsening pneumonia due to deep inspiratory effort from pain, will start on as needed pain medications.   History of pulmonary fibrosis - Continue with as needed nebs   Essential hypertension - Continue home medications   Coronary artery disease - Continue with aspirin  and statin   Mixed hyperlipidemia -Continue statin   GERD -Continue pantoprazole    DVT Prophylaxis Heparin   AM Labs Ordered, also please review Full Orders  Family Communication: Admission, patients condition and plan of care including tests being ordered have been discussed with the patient and daughter at bedside who indicate understanding and agree with the plan and Code Status.  Code Status full code  Likely DC to home  Consults called: None  Admission status: Inpatient  Time spent in minutes : 70 minutes   Brayton Lye M.D on 08/09/2024 at 4:16 PM   [facility] - Office  [phone]

## 2024-08-10 DIAGNOSIS — J189 Pneumonia, unspecified organism: Secondary | ICD-10-CM | POA: Diagnosis not present

## 2024-08-10 LAB — BASIC METABOLIC PANEL WITH GFR
Anion gap: 11 (ref 5–15)
BUN: 15 mg/dL (ref 8–23)
CO2: 26 mmol/L (ref 22–32)
Calcium: 8.3 mg/dL — ABNORMAL LOW (ref 8.9–10.3)
Chloride: 99 mmol/L (ref 98–111)
Creatinine, Ser: 0.74 mg/dL (ref 0.61–1.24)
GFR, Estimated: >60 mL/min (ref >60)
Glucose, Bld: 94 mg/dL (ref 70–99)
Potassium: 3.7 mmol/L (ref 3.5–5.1)
Sodium: 136 mmol/L (ref 135–145)

## 2024-08-10 LAB — CBC
HCT: 36.5 % — ABNORMAL LOW (ref 39.0–52.0)
Hemoglobin: 11.7 g/dL — ABNORMAL LOW (ref 13.0–17.0)
MCH: 29 pg (ref 26.0–34.0)
MCHC: 32.1 g/dL (ref 30.0–36.0)
MCV: 90.6 fL (ref 80.0–100.0)
Platelets: 158 K/uL (ref 150–400)
RBC: 4.03 MIL/uL — ABNORMAL LOW (ref 4.22–5.81)
RDW: 13.8 % (ref 11.5–15.5)
WBC: 14.7 K/uL — ABNORMAL HIGH (ref 4.0–10.5)
nRBC: 0 % (ref 0.0–0.2)

## 2024-08-10 LAB — RESP PANEL BY RT-PCR (RSV, FLU A&B, COVID)  RVPGX2
Influenza A by PCR: NEGATIVE
Influenza B by PCR: NEGATIVE
Resp Syncytial Virus by PCR: NEGATIVE
SARS Coronavirus 2 by RT PCR: NEGATIVE

## 2024-08-10 NOTE — Evaluation (Signed)
 Physical Therapy Evaluation Patient Details Name: TASHON CAPP MRN: [id number] DOB: 1941-01-05 Today's Date: 08/10/2024  History of Present Illness  Brylan Dec  is a 83 y.o. male, with a history of hypertension, coronary disease, pulmonary fibrosis, GERD, hyperlipidemia.  - Patient presents to ED secondary to complaints of shortness of breath, cough, fatigue, low-grade temperature, ongoing for last 4 days since Sunday, patient was seen at the urgent care recently where he was diagnosed with pneumonia prescribed Augmentin  and Zithromycin, but did not receive any oral antibiotics yet that he was discharged late yesterday from urgent care and pharmacy was closed, he presents to ED due to worsening dyspnea and right-sided chest pain, as well he reports nausea.   Clinical Impression  Patient demonstrates good return for sitting up at bedside, slow labored movement during transfer without AD requiring hand held assist, safer using RW with good return for ambulating in room, hallway without loss of balance. Patient tolerated sitting up in chair after therapy - RN aware. Patient will benefit from continued skilled physical therapy in hospital and recommended venue below to increase strength, balance, endurance for safe ADLs and gait.          If plan is discharge home, recommend the following: A little help with walking and/or transfers;A little help with bathing/dressing/bathroom;Help with stairs or ramp for entrance;Assistance with cooking/housework;Assist for transportation   Can travel by private vehicle        Equipment Recommendations None recommended by PT  Recommendations for Other Services       Functional Status Assessment Patient has had a recent decline in their functional status and demonstrates the ability to make significant improvements in function in a reasonable and predictable amount of time.     Precautions / Restrictions Precautions Precautions: Fall Recall of  Precautions/Restrictions: Intact Restrictions Weight Bearing Restrictions Per Provider Order: No      Mobility  Bed Mobility Overal bed mobility: Modified Independent                  Transfers Overall transfer level: Needs assistance Equipment used: None, Rolling Weedon (2 wheels) Transfers: Sit to/from Stand, Bed to chair/wheelchair/BSC Sit to Stand: Contact guard assist   Step pivot transfers: Supervision, Contact guard assist       General transfer comment: labored movement requiring hand held assist during transfers without AD, safer using RW    Ambulation/Gait Ambulation/Gait assistance: Supervision, Contact guard assist Gait Distance (Feet): 100 Feet Assistive device: Rolling Corkins (2 wheels) Gait Pattern/deviations: Decreased step length - right, Decreased step length - left, Decreased stride length Gait velocity: decreased     General Gait Details: slightly labored movement without loss of balance with good return for using RW  Stairs            Wheelchair Mobility     Tilt Bed    Modified Rankin (Stroke Patients Only)       Balance Overall balance assessment: Needs assistance Sitting-balance support: Feet supported, No upper extremity supported Sitting balance-Leahy Scale: Good Sitting balance - Comments: seated at EOB   Standing balance support: During functional activity, No upper extremity supported Standing balance-Leahy Scale: Poor Standing balance comment: fair/poor without AD, fair/good using RW                             Pertinent Vitals/Pain Pain Assessment Pain Assessment: No/denies pain    Home Living Family/patient expects to be discharged to:: Private residence  Living Arrangements: Other relatives;Children Available Help at Discharge: Family;Available 24 hours/day Type of Home: House Home Access: Level entry     Alternate Level Stairs-Number of Steps: 15 Home Layout: Two level Home Equipment:  Agricultural consultant (2 wheels);Cane - single point;Grab bars - tub/shower;Shower seat      Prior Function Prior Level of Function : Independent/Modified Independent             Mobility Comments: household and short distanced community ambulation using SPC PRN ADLs Comments: Independent for household, assisted for community     Extremity/Trunk Assessment   Upper Extremity Assessment Upper Extremity Assessment: Overall WFL for tasks assessed    Lower Extremity Assessment Lower Extremity Assessment: Generalized weakness    Cervical / Trunk Assessment Cervical / Trunk Assessment: Kyphotic  Communication   Communication Communication: No apparent difficulties    Cognition Arousal: Alert Behavior During Therapy: WFL for tasks assessed/performed   PT - Cognitive impairments: No apparent impairments                         Following commands: Intact       Cueing Cueing Techniques: Verbal cues     General Comments      Exercises     Assessment/Plan    PT Assessment Patient needs continued PT services  PT Problem List Decreased strength;Decreased activity tolerance;Decreased balance;Decreased mobility       PT Treatment Interventions DME instruction;Gait training;Stair training;Functional mobility training;Therapeutic activities;Therapeutic exercise;Balance training;Patient/family education    PT Goals (Current goals can be found in the Care Plan section)  Acute Rehab PT Goals Patient Stated Goal: return home with family to assist PT Goal Formulation: With patient Time For Goal Achievement: 08/13/24 Potential to Achieve Goals: Good    Frequency Min 3X/week     Co-evaluation               AM-PAC PT 6 Clicks Mobility  Outcome Measure Help needed turning from your back to your side while in a flat bed without using bedrails?: None Help needed moving from lying on your back to sitting on the side of a flat bed without using bedrails?:  None Help needed moving to and from a bed to a chair (including a wheelchair)?: A Little Help needed standing up from a chair using your arms (e.g., wheelchair or bedside chair)?: A Little Help needed to walk in hospital room?: A Little Help needed climbing 3-5 steps with a railing? : A Little 6 Click Score: 20    End of Session   Activity Tolerance: Patient tolerated treatment well;Patient limited by fatigue Patient left: in chair;with call bell/phone within reach Nurse Communication: Mobility status PT Visit Diagnosis: Unsteadiness on feet (R26.81);Other abnormalities of gait and mobility (R26.89);Muscle weakness (generalized) (M62.81)    Time: 8540-8480 PT Time Calculation (min) (ACUTE ONLY): 20 min   Charges:   PT Evaluation $PT Eval Moderate Complexity: 1 Mod PT Treatments $Therapeutic Activity: 8-22 mins PT General Charges $$ ACUTE PT VISIT: 1 Visit         3:39 PM, 08/10/24 Lynwood Music, MPT Physical Therapist with [facility] [phone] office [phone] mobile phone

## 2024-08-10 NOTE — Progress Notes (Addendum)
 PROGRESS NOTE    Jason Spence  FMW:[id number] DOB: August 13, 1941 DOA: 08/09/2024 PCP: Alphonsa Glendia LABOR, MD   Brief Narrative:  83 y.o. male, with a history of hypertension, coronary disease, pulmonary fibrosis, GERD, hyperlipidemia. - Patient presented to ED secondary to complaints of shortness of breath, cough, fatigue, low-grade temperature, ongoing for last 4 days since Sunday, patient was seen at the urgent care recently where he was diagnosed with pneumonia prescribed Augmentin and Zithromycin, but did not receive any oral antibiotics yet  On intravenous antibiotics for treatment of possible bacterial right-sided community-acquired pneumonia.  He lives at home with his son and daughter-in-law.   Assessment & Plan:  Principal Problem:   Pneumonia Active Problems:   Essential hypertension, benign   GERD (gastroesophageal reflux disease)   Pulmonary fibrosis (HCC)   S/P CABG (coronary artery bypass graft)    Right lower lobe possible bacterial community-acquired pneumonia, POA: -CTA chest negative for PE but significant for right lobar pneumonia -Continue with IV Rocephin and azithromycin -Follow blood cultures, sputum cultures, Legionella and strep pneumonia antigen - Continue with flutter valve/incentive spirometry -Transition to oral antibiotics in the next 24 to 48 hours -No evidence of acute respiratory failure with hypoxia -Ordered Covid, RSV and influenza panel to be checked.   History of pulmonary fibrosis - Continue with as needed nebs -Outpatient follow-up his pulmonologist at [place].   Essential hypertension - Continue home medications   Coronary artery disease, POA: No acute issues.  Denies chest pain - Continue with aspirin and statin   Mixed hyperlipidemia -Continue statin   GERD -Continue pantoprazole  Disposition: Home.  Patient is at home with his son and daughter-in-law  DVT prophylaxis: heparin injection 5,000 Units Start: 08/09/24 2200      Code Status: Full Code Family Communication: None at the bedside Status is: Inpatient Remains inpatient appropriate because: Community-acquired possible bacterial pneumonia    Subjective:  His shortness of breath is improved.  He said that he was seen as an outpatient and was prescribed oral antibiotics but did not get the prescription yet.  He follows up with a pulmonologist at [place] for his pulmonary fibrosis.  He also told me that his eyesight is bad because of bilateral macular degeneration.  I spoke to him about the results of the CT angio of the chest which did not show pulmonary embolism but did show right-sided pneumonia.  We also discussed about continuation of intravenous antibiotics for at least next 24 to 48 hours and then transitioning to oral antibiotics before he can be discharged home.  Lives at home with his son and daughter-in-law.   Examination:  General exam: Appears calm and comfortable  Respiratory system: Clear to auscultation. Respiratory effort normal. Cardiovascular system: S1 & S2 heard, RRR. No JVD, murmurs, rubs, gallops or clicks. No pedal edema. Gastrointestinal system: Abdomen is nondistended, soft and nontender. No organomegaly or masses felt. Normal bowel sounds heard. Central nervous system: Alert and oriented. No focal neurological deficits. Extremities: Symmetric 5 x 5 power. Skin: No rashes, lesions or ulcers Psychiatry: Judgement and insight appear normal. Mood & affect appropriate.      Diet Orders (From admission, onward)     Start     Ordered   08/09/24 1626  Diet Heart Room service appropriate? Yes; Fluid consistency: Thin  Diet effective now       Question Answer Comment  Room service appropriate? Yes   Fluid consistency: Thin      10 /23/25 1625  Objective: Vitals:   08/09/24 1659 08/09/24 2114 08/10/24 0143 08/10/24 0552  BP: (!) 166/75 (!) 132/53 (!) 123/42 (!) 119/43  Pulse: 79 81 74 69  Resp: 19 18 18 18   Temp:  98.1 F (36.7 C) 98 F (36.7 C) 98.7 F (37.1 C) 97.6 F (36.4 C)  TempSrc: Oral Oral Oral Oral  SpO2: 94% 92% 92% 93%  Weight:      Height:        Intake/Output Summary (Last 24 hours) at 08/10/2024 0927 Last data filed at 08/10/2024 0815 Gross per 24 hour  Intake 1332.2 ml  Output --  Net 1332.2 ml   Filed Weights   08/09/24 0857  Weight: 54.4 kg    Scheduled Meds:  aspirin   81 mg Oral Daily   diltiazem   30 mg Oral Q12H   guaiFENesin   600 mg Oral BID   heparin  5,000 Units Subcutaneous Q8H   lidocaine   1 patch Transdermal Q24H   pantoprazole   40 mg Oral Daily   polyethylene glycol  17 g Oral BID   pravastatin   40 mg Oral q1800   Continuous Infusions:  sodium chloride  50 mL/hr at 08/10/24 0222   azithromycin  500 mg (08/10/24 0850)   cefTRIAXone  (ROCEPHIN )  IV      Nutritional status     Body mass index is 18.79 kg/m.  Data Reviewed:   CBC: Recent Labs  Lab 08/09/24 0930 08/10/24 0401  WBC 18.0* 14.7*  NEUTROABS 15.2*  --   HGB 12.8* 11.7*  HCT 39.6 36.5*  MCV 91.0 90.6  PLT 172 158   Basic Metabolic Panel: Recent Labs  Lab 08/09/24 0930 08/10/24 0401  NA 137 136  K 4.1 3.7  CL 98 99  CO2 28 26  GLUCOSE 105* 94  BUN 16 15  CREATININE 0.87 0.74  CALCIUM 9.0 8.3*   GFR: Estimated Creatinine Clearance: 52.9 mL/min (by C-G formula based on SCr of 0.74 mg/dL). Liver Function Tests: Recent Labs  Lab 08/09/24 0930  AST 22  ALT 10  ALKPHOS 105  BILITOT 1.0  PROT 7.4  ALBUMIN 3.9   Recent Labs  Lab 08/09/24 0930  LIPASE 16   No results for input(s): AMMONIA in the last 168 hours. Coagulation Profile: No results for input(s): INR, PROTIME in the last 168 hours. Cardiac Enzymes: No results for input(s): CKTOTAL, CKMB, CKMBINDEX, TROPONINI in the last 168 hours. BNP (last 3 results) No results for input(s): PROBNP in the last 8760 hours. HbA1C: No results for input(s): HGBA1C in the last 72 hours. CBG: No  results for input(s): GLUCAP in the last 168 hours. Lipid Profile: No results for input(s): CHOL, HDL, LDLCALC, TRIG, CHOLHDL, LDLDIRECT in the last 72 hours. Thyroid  Function Tests: No results for input(s): TSH, T4TOTAL, FREET4, T3FREE, THYROIDAB in the last 72 hours. Anemia Panel: No results for input(s): VITAMINB12, FOLATE, FERRITIN, TIBC, IRON, RETICCTPCT in the last 72 hours. Sepsis Labs: No results for input(s): PROCALCITON, LATICACIDVEN in the last 168 hours.  Recent Results (from the past 240 hours)  Culture, blood (routine x 2) Call MD if unable to obtain prior to antibiotics being given     Status: None (Preliminary result)   Collection Time: 08/09/24  6:55 PM   Specimen: BLOOD  Result Value Ref Range Status   Specimen Description BLOOD LEFT ANTECUBITAL  Final   Special Requests   Final    AEROBIC BOTTLE ONLY Blood Culture results may not be optimal due to an inadequate volume of blood  received in culture bottles   Culture   Final    NO GROWTH < 12 HOURS Performed at [facility], [address], [place], [place] [address]    Report Status PENDING  Incomplete  Culture, blood (routine x 2) Call MD if unable to obtain prior to antibiotics being given     Status: None (Preliminary result)   Collection Time: 08/09/24  6:55 PM   Specimen: BLOOD  Result Value Ref Range Status   Specimen Description BLOOD BLOOD LEFT WRIST  Final   Special Requests   Final    AEROBIC BOTTLE ONLY Blood Culture results may not be optimal due to an inadequate volume of blood received in culture bottles   Culture   Final    NO GROWTH < 12 HOURS Performed at [facility], [address] [place], [place] [address]    Report Status PENDING  Incomplete         Radiology Studies: CT ABDOMEN PELVIS W CONTRAST Result Date: 08/09/2024 CLINICAL DATA:  Right-sided chest pain. Concern for pulmonary embolism. Abdominal pain. EXAM: CT ANGIOGRAPHY CHEST CT ABDOMEN  AND PELVIS WITH CONTRAST TECHNIQUE: Multidetector CT imaging of the chest was performed using the standard protocol during bolus administration of intravenous contrast. Multiplanar CT image reconstructions and MIPs were obtained to evaluate the vascular anatomy. Multidetector CT imaging of the abdomen and pelvis was performed using the standard protocol during bolus administration of intravenous contrast. RADIATION DOSE REDUCTION: This exam was performed according to the departmental dose-optimization program which includes automated exposure control, adjustment of the mA and/or kV according to patient size and/or use of iterative reconstruction technique. CONTRAST:  OMNIPAQUE  IOHEXOL  350 MG/ML SOLN COMPARISON:  Chest CT dated 11/14/2023. CT abdomen pelvis dated 04/09/2024. FINDINGS: CTA CHEST FINDINGS Cardiovascular: There is mild cardiomegaly. No pericardial effusion. There is coronary vascular calcification. Moderate atherosclerotic calcification of the thoracic aorta. No aneurysmal dilatation or dissection. The origins of the great vessels of the aortic arch are patent. No pulmonary artery embolus identified. Mediastinum/Nodes: Bilateral hilar and mediastinal adenopathy measure 18 mm in the subcarinal region and 15 mm in the right hilum and overall slightly increased since the prior CT, likely reactive. The esophagus is grossly unremarkable. No mediastinal fluid collection. Lungs/Pleura: Background of interstitial lung disease with bibasilar subpleural reticulation and honeycombing. Patchy area of consolidation in the right lung base concerning for pneumonia. There is additional nodular airspace density anterior to the right hilum as well as additional smaller scattered ground-glass density, likely inflammatory/infectious in etiology. Clinical correlation and follow-up recommended. Stable 13 mm nodule at the left lung base previously described as hamartoma. There is bronchial thickening and impaction in  the right lower lobe. Small right pleural effusion. No pneumothorax. The central airways are patent. Musculoskeletal: Median sternotomy wires. No acute osseous pathology. Review of the MIP images confirms the above findings. CT ABDOMEN and PELVIS FINDINGS No intra-abdominal free air or free fluid. Hepatobiliary: The liver is unremarkable. No biliary dilatation. The gallbladder is unremarkable. Pancreas: Unremarkable. No pancreatic ductal dilatation or surrounding inflammatory changes. Spleen: Normal in size without focal abnormality. Adrenals/Urinary Tract: The adrenal glands unremarkable. The kidneys, visualized ureters, and urinary bladder appear unremarkable. Stomach/Bowel: There is redundancy of the sigmoid colon. There is sigmoid diverticulosis and scattered colonic diverticula. There is no bowel obstruction or active inflammation. The appendix is normal. Vascular/Lymphatic: Moderate aortoiliac atherosclerotic disease. The IVC is unremarkable. No portal gas. There is no adenopathy. Reproductive: Mildly enlarged prostate gland measuring approximately 5 cm in  transverse axial diameter. Other: None Musculoskeletal: Degenerative changes of spine. No acute osseous pathology. Review of the MIP images confirms the above findings. IMPRESSION: 1. No CT evidence of pulmonary artery embolus. 2. Right lower lobe pneumonia with small right pleural effusion. Follow-up to resolution recommended. 3. Background of interstitial lung disease. 4. No acute intra-abdominal or pelvic pathology. 5. Colonic diverticulosis. No bowel obstruction. Normal appendix. 6.  Aortic Atherosclerosis (ICD10-I70.0). Electronically Signed   By: Vanetta Chou M.D.   On: 08/09/2024 13:55   CT Angio Chest PE W/Cm &/Or Wo Cm Result Date: 08/09/2024 CLINICAL DATA:  Right-sided chest pain. Concern for pulmonary embolism. Abdominal pain. EXAM: CT ANGIOGRAPHY CHEST CT ABDOMEN AND PELVIS WITH CONTRAST TECHNIQUE: Multidetector CT imaging of the chest  was performed using the standard protocol during bolus administration of intravenous contrast. Multiplanar CT image reconstructions and MIPs were obtained to evaluate the vascular anatomy. Multidetector CT imaging of the abdomen and pelvis was performed using the standard protocol during bolus administration of intravenous contrast. RADIATION DOSE REDUCTION: This exam was performed according to the departmental dose-optimization program which includes automated exposure control, adjustment of the mA and/or kV according to patient size and/or use of iterative reconstruction technique. CONTRAST:  OMNIPAQUE  IOHEXOL  350 MG/ML SOLN COMPARISON:  Chest CT dated 11/14/2023. CT abdomen pelvis dated 04/09/2024. FINDINGS: CTA CHEST FINDINGS Cardiovascular: There is mild cardiomegaly. No pericardial effusion. There is coronary vascular calcification. Moderate atherosclerotic calcification of the thoracic aorta. No aneurysmal dilatation or dissection. The origins of the great vessels of the aortic arch are patent. No pulmonary artery embolus identified. Mediastinum/Nodes: Bilateral hilar and mediastinal adenopathy measure 18 mm in the subcarinal region and 15 mm in the right hilum and overall slightly increased since the prior CT, likely reactive. The esophagus is grossly unremarkable. No mediastinal fluid collection. Lungs/Pleura: Background of interstitial lung disease with bibasilar subpleural reticulation and honeycombing. Patchy area of consolidation in the right lung base concerning for pneumonia. There is additional nodular airspace density anterior to the right hilum as well as additional smaller scattered ground-glass density, likely inflammatory/infectious in etiology. Clinical correlation and follow-up recommended. Stable 13 mm nodule at the left lung base previously described as hamartoma. There is bronchial thickening and impaction in the right lower lobe. Small right pleural effusion. No pneumothorax. The  central airways are patent. Musculoskeletal: Median sternotomy wires. No acute osseous pathology. Review of the MIP images confirms the above findings. CT ABDOMEN and PELVIS FINDINGS No intra-abdominal free air or free fluid. Hepatobiliary: The liver is unremarkable. No biliary dilatation. The gallbladder is unremarkable. Pancreas: Unremarkable. No pancreatic ductal dilatation or surrounding inflammatory changes. Spleen: Normal in size without focal abnormality. Adrenals/Urinary Tract: The adrenal glands unremarkable. The kidneys, visualized ureters, and urinary bladder appear unremarkable. Stomach/Bowel: There is redundancy of the sigmoid colon. There is sigmoid diverticulosis and scattered colonic diverticula. There is no bowel obstruction or active inflammation. The appendix is normal. Vascular/Lymphatic: Moderate aortoiliac atherosclerotic disease. The IVC is unremarkable. No portal gas. There is no adenopathy. Reproductive: Mildly enlarged prostate gland measuring approximately 5 cm in transverse axial diameter. Other: None Musculoskeletal: Degenerative changes of spine. No acute osseous pathology. Review of the MIP images confirms the above findings. IMPRESSION: 1. No CT evidence of pulmonary artery embolus. 2. Right lower lobe pneumonia with small right pleural effusion. Follow-up to resolution recommended. 3. Background of interstitial lung disease. 4. No acute intra-abdominal or pelvic pathology. 5. Colonic diverticulosis. No bowel obstruction. Normal appendix. 6.  Aortic Atherosclerosis (ICD10-I70.0). Electronically  Signed   By: Arash  Radparvar M.D.   On: 08/09/2024 13:55           LOS: 1 day   Time spent= 37 mins    Deliliah Room, MD [facility]  If 7PM-7AM, please contact night-coverage  08/10/2024, 9:27 AM

## 2024-08-10 NOTE — Plan of Care (Signed)
  Problem: Acute Rehab PT Goals(only PT should resolve) Goal: Pt Will Go Supine/Side To Sit Outcome: Progressing Flowsheets (Taken 08/10/2024 1540) Pt will go Supine/Side to Sit:  Independently  with modified independence Goal: Patient Will Transfer Sit To/From Stand Outcome: Progressing Flowsheets (Taken 08/10/2024 1540) Patient will transfer sit to/from stand:  with modified independence  with supervision Goal: Pt Will Transfer Bed To Chair/Chair To Bed Outcome: Progressing Flowsheets (Taken 08/10/2024 1540) Pt will Transfer Bed to Chair/Chair to Bed:  with modified independence  with supervision Goal: Pt Will Ambulate Outcome: Progressing Flowsheets (Taken 08/10/2024 1540) Pt will Ambulate:  > 125 feet  with modified independence  with supervision  with least restrictive assistive device   3:40 PM, 08/10/24 Lynwood Music, MPT Physical Therapist with [facility] [phone] office [phone] mobile phone

## 2024-08-10 NOTE — Plan of Care (Signed)
   Problem: Activity: Goal: Risk for activity intolerance will decrease Outcome: Progressing   Problem: Coping: Goal: Level of anxiety will decrease Outcome: Progressing

## 2024-08-10 NOTE — TOC CM/SW Note (Signed)
 Transition of Care [facility]) - Inpatient Brief Assessment   Patient Details  Name: Jason Spence MRN: [id number] Date of Birth: 02-29-1941  Transition of [facility]) CM/SW Contact:    Noreen KATHEE Cleotilde ISRAEL Phone Number: 08/10/2024, 3:56 PM   Clinical Narrative:  Son stated that he would speak with patient about [facility] and let ICM know if he is agreeable for it to be setup in the home.   Transition of Care Asessment: Insurance and Status: Insurance coverage has been reviewed Patient has primary care physician: Yes Home environment has been reviewed: Single Family Home with son and daughter n law Prior level of function:: Independent Prior/Current Home Services: No current home services Social Drivers of Health Review: SDOH reviewed no interventions necessary Readmission risk has been reviewed: Yes Transition of care needs: transition of care needs identified, TOC will continue to follow

## 2024-08-11 DIAGNOSIS — J189 Pneumonia, unspecified organism: Secondary | ICD-10-CM | POA: Diagnosis not present

## 2024-08-11 LAB — CBC WITH DIFFERENTIAL/PLATELET
Abs Immature Granulocytes: 0.08 K/uL — ABNORMAL HIGH (ref 0.00–0.07)
Basophils Absolute: 0.1 K/uL (ref 0.0–0.1)
Basophils Relative: 0 %
Eosinophils Absolute: 0.3 K/uL (ref 0.0–0.5)
Eosinophils Relative: 2 %
HCT: 34.3 % — ABNORMAL LOW (ref 39.0–52.0)
Hemoglobin: 11.2 g/dL — ABNORMAL LOW (ref 13.0–17.0)
Immature Granulocytes: 1 %
Lymphocytes Relative: 9 %
Lymphs Abs: 1.1 K/uL (ref 0.7–4.0)
MCH: 29.2 pg (ref 26.0–34.0)
MCHC: 32.7 g/dL (ref 30.0–36.0)
MCV: 89.6 fL (ref 80.0–100.0)
Monocytes Absolute: 1.2 K/uL — ABNORMAL HIGH (ref 0.1–1.0)
Monocytes Relative: 10 %
Neutro Abs: 10.1 K/uL — ABNORMAL HIGH (ref 1.7–7.7)
Neutrophils Relative %: 78 %
Platelets: 179 K/uL (ref 150–400)
RBC: 3.83 MIL/uL — ABNORMAL LOW (ref 4.22–5.81)
RDW: 13.8 % (ref 11.5–15.5)
WBC: 12.8 K/uL — ABNORMAL HIGH (ref 4.0–10.5)
nRBC: 0 % (ref 0.0–0.2)

## 2024-08-11 MED ORDER — GUAIFENESIN ER 600 MG PO TB12: 600.0000 mg | ORAL_TABLET | Freq: Two times a day (BID) | ORAL | 0 refills | Status: AC

## 2024-08-11 NOTE — Discharge Summary (Signed)
 Physician Discharge Summary  Jason Spence FMW:[id number] DOB: 12-13-1940 DOA: 08/09/2024  PCP: Alphonsa Glendia LABOR, MD  Admit date: 08/09/2024 Discharge date: 08/11/2024  Admitted From: Home  Discharge disposition: Home with home health   Recommendations for Outpatient Follow-Up:   Follow up with your primary care provider in one week.  Check CBC, BMP, magnesium in the next visit Patient has been advised to complete the course of antibiotic.  Would benefit from repeating chest x-ray in 4 to 6 weeks  Discharge Diagnosis:   Principal Problem:   Pneumonia Active Problems:   Essential hypertension, benign   GERD (gastroesophageal reflux disease)   Pulmonary fibrosis (HCC)   S/P CABG (coronary artery bypass graft)    Discharge Condition: Improved.  Diet recommendation: Low sodium, heart healthy.  Wound care: None.  Code status: Full.   History of Present Illness:   83 y.o. male, with a history of hypertension, coronary disease, pulmonary fibrosis, GERD, hyperlipidemia presented to ED secondary to complaints of shortness of breath, cough, fatigue, low-grade temperature, for 4 days since Sunday, patient was seen at the urgent care recently where he was diagnosed with pneumonia prescribed Augmentin  and Zithromycin, but did not receive any oral antibiotics yet.  Patient did not feel better soon decided to come to the hospital and was admitted for further evaluation and treatment.  He lives at home with his son and daughter-in-law.    Hospital Course:   Following conditions were addressed during hospitalization as listed below,  Right lower lobe possible bacterial community-acquired pneumonia CTA of the chest was negative for PE but at the right lobe pneumonia.  Received IV Rocephin  and Zithromax  and has clinically improved significantly.  No need for oxygen requirement.  Plan is to continue oral antibiotics from home on discharge.  Patient has been afebrile.  Blood  cultures negative in 2 days.  COVID influenza and RSV was negative.  Leukocytosis has trended down from initial elevation.   History of pulmonary fibrosis - Continue with as needed nebs,Outpatient follow-up his pulmonologist at [facility].   Essential hypertension Continue Cardizem  from home.   History of coronary artery disease- No acute issues.  Denies chest pain - Continue with aspirin  and statin   Mixed hyperlipidemia Continue pravastatin    GERD -Continue pantoprazole   Disposition.  At this time, patient is stable for disposition home with home health with outpatient PCP follow-up.  Spoke with the patient's daughter-in-law at bedside prior to discharge.  Medical Consultants:   None.  Procedures:    None Subjective:   Today, patient was seen and examined at bedside.  Patient states that he has some cough but not much productive sputum.  Denies any chest pain fever chills shortness of breath or dyspnea.  Not on supplemental oxygen.  Wishes to go home.  Discharge Exam:   Vitals:   08/10/24 2008 08/11/24 0434  BP: (!) 151/71 (!) 145/72  Pulse: 81 75  Resp: 19 18  Temp: 98.4 F (36.9 C) 98.6 F (37 C)  SpO2: 97% 97%   Vitals:   08/10/24 0552 08/10/24 1357 08/10/24 2008 08/11/24 0434  BP: (!) 119/43 (!) 128/50 (!) 151/71 (!) 145/72  Pulse: 69 62 81 75  Resp: 18 17 19 18   Temp: 97.6 F (36.4 C) 98 F (36.7 C) 98.4 F (36.9 C) 98.6 F (37 C)  TempSrc: Oral Oral Oral Oral  SpO2: 93% 98% 97% 97%  Weight:      Height:       Body mass  index is 18.79 kg/m.  General: Alert awake, not in obvious distress, thinly built, on room air, Communicative, thinly built, elderly male, HENT: pupils equally reacting to light,  No scleral pallor or icterus noted. Oral mucosa is moist.  Chest: Coarse breath sounds noted bilaterally.   CVS: S1 &S2 heard. No murmur.  Regular rate and rhythm. Abdomen: Soft, nontender, nondistended.  Bowel sounds are heard.   Extremities: No cyanosis,  clubbing or edema.  Peripheral pulses are palpable. Psych: Alert, awake and oriented, normal mood CNS:  No cranial nerve deficits.  Power equal in all extremities.   Skin: Warm and dry.  No rashes noted.  The results of significant diagnostics from this hospitalization (including imaging, microbiology, ancillary and laboratory) are listed below for reference.     Diagnostic Studies:   CT ABDOMEN PELVIS W CONTRAST Result Date: 08/09/2024 CLINICAL DATA:  Right-sided chest pain. Concern for pulmonary embolism. Abdominal pain. EXAM: CT ANGIOGRAPHY CHEST CT ABDOMEN AND PELVIS WITH CONTRAST TECHNIQUE: Multidetector CT imaging of the chest was performed using the standard protocol during bolus administration of intravenous contrast. Multiplanar CT image reconstructions and MIPs were obtained to evaluate the vascular anatomy. Multidetector CT imaging of the abdomen and pelvis was performed using the standard protocol during bolus administration of intravenous contrast. RADIATION DOSE REDUCTION: This exam was performed according to the departmental dose-optimization program which includes automated exposure control, adjustment of the mA and/or kV according to patient size and/or use of iterative reconstruction technique. CONTRAST:  OMNIPAQUE  IOHEXOL  350 MG/ML SOLN COMPARISON:  Chest CT dated 11/14/2023. CT abdomen pelvis dated 04/09/2024. FINDINGS: CTA CHEST FINDINGS Cardiovascular: There is mild cardiomegaly. No pericardial effusion. There is coronary vascular calcification. Moderate atherosclerotic calcification of the thoracic aorta. No aneurysmal dilatation or dissection. The origins of the great vessels of the aortic arch are patent. No pulmonary artery embolus identified. Mediastinum/Nodes: Bilateral hilar and mediastinal adenopathy measure 18 mm in the subcarinal region and 15 mm in the right hilum and overall slightly increased since the prior CT, likely reactive. The esophagus is grossly  unremarkable. No mediastinal fluid collection. Lungs/Pleura: Background of interstitial lung disease with bibasilar subpleural reticulation and honeycombing. Patchy area of consolidation in the right lung base concerning for pneumonia. There is additional nodular airspace density anterior to the right hilum as well as additional smaller scattered ground-glass density, likely inflammatory/infectious in etiology. Clinical correlation and follow-up recommended. Stable 13 mm nodule at the left lung base previously described as hamartoma. There is bronchial thickening and impaction in the right lower lobe. Small right pleural effusion. No pneumothorax. The central airways are patent. Musculoskeletal: Median sternotomy wires. No acute osseous pathology. Review of the MIP images confirms the above findings. CT ABDOMEN and PELVIS FINDINGS No intra-abdominal free air or free fluid. Hepatobiliary: The liver is unremarkable. No biliary dilatation. The gallbladder is unremarkable. Pancreas: Unremarkable. No pancreatic ductal dilatation or surrounding inflammatory changes. Spleen: Normal in size without focal abnormality. Adrenals/Urinary Tract: The adrenal glands unremarkable. The kidneys, visualized ureters, and urinary bladder appear unremarkable. Stomach/Bowel: There is redundancy of the sigmoid colon. There is sigmoid diverticulosis and scattered colonic diverticula. There is no bowel obstruction or active inflammation. The appendix is normal. Vascular/Lymphatic: Moderate aortoiliac atherosclerotic disease. The IVC is unremarkable. No portal gas. There is no adenopathy. Reproductive: Mildly enlarged prostate gland measuring approximately 5 cm in transverse axial diameter. Other: None Musculoskeletal: Degenerative changes of spine. No acute osseous pathology. Review of the MIP images confirms the above findings. IMPRESSION: 1. No  CT evidence of pulmonary artery embolus. 2. Right lower lobe pneumonia with small right pleural  effusion. Follow-up to resolution recommended. 3. Background of interstitial lung disease. 4. No acute intra-abdominal or pelvic pathology. 5. Colonic diverticulosis. No bowel obstruction. Normal appendix. 6.  Aortic Atherosclerosis (ICD10-I70.0). Electronically Signed   By: Vanetta Chou M.D.   On: 08/09/2024 13:55   CT Angio Chest PE W/Cm &/Or Wo Cm Result Date: 08/09/2024 CLINICAL DATA:  Right-sided chest pain. Concern for pulmonary embolism. Abdominal pain. EXAM: CT ANGIOGRAPHY CHEST CT ABDOMEN AND PELVIS WITH CONTRAST TECHNIQUE: Multidetector CT imaging of the chest was performed using the standard protocol during bolus administration of intravenous contrast. Multiplanar CT image reconstructions and MIPs were obtained to evaluate the vascular anatomy. Multidetector CT imaging of the abdomen and pelvis was performed using the standard protocol during bolus administration of intravenous contrast. RADIATION DOSE REDUCTION: This exam was performed according to the departmental dose-optimization program which includes automated exposure control, adjustment of the mA and/or kV according to patient size and/or use of iterative reconstruction technique. CONTRAST:  OMNIPAQUE  IOHEXOL  350 MG/ML SOLN COMPARISON:  Chest CT dated 11/14/2023. CT abdomen pelvis dated 04/09/2024. FINDINGS: CTA CHEST FINDINGS Cardiovascular: There is mild cardiomegaly. No pericardial effusion. There is coronary vascular calcification. Moderate atherosclerotic calcification of the thoracic aorta. No aneurysmal dilatation or dissection. The origins of the great vessels of the aortic arch are patent. No pulmonary artery embolus identified. Mediastinum/Nodes: Bilateral hilar and mediastinal adenopathy measure 18 mm in the subcarinal region and 15 mm in the right hilum and overall slightly increased since the prior CT, likely reactive. The esophagus is grossly unremarkable. No mediastinal fluid collection. Lungs/Pleura: Background of  interstitial lung disease with bibasilar subpleural reticulation and honeycombing. Patchy area of consolidation in the right lung base concerning for pneumonia. There is additional nodular airspace density anterior to the right hilum as well as additional smaller scattered ground-glass density, likely inflammatory/infectious in etiology. Clinical correlation and follow-up recommended. Stable 13 mm nodule at the left lung base previously described as hamartoma. There is bronchial thickening and impaction in the right lower lobe. Small right pleural effusion. No pneumothorax. The central airways are patent. Musculoskeletal: Median sternotomy wires. No acute osseous pathology. Review of the MIP images confirms the above findings. CT ABDOMEN and PELVIS FINDINGS No intra-abdominal free air or free fluid. Hepatobiliary: The liver is unremarkable. No biliary dilatation. The gallbladder is unremarkable. Pancreas: Unremarkable. No pancreatic ductal dilatation or surrounding inflammatory changes. Spleen: Normal in size without focal abnormality. Adrenals/Urinary Tract: The adrenal glands unremarkable. The kidneys, visualized ureters, and urinary bladder appear unremarkable. Stomach/Bowel: There is redundancy of the sigmoid colon. There is sigmoid diverticulosis and scattered colonic diverticula. There is no bowel obstruction or active inflammation. The appendix is normal. Vascular/Lymphatic: Moderate aortoiliac atherosclerotic disease. The IVC is unremarkable. No portal gas. There is no adenopathy. Reproductive: Mildly enlarged prostate gland measuring approximately 5 cm in transverse axial diameter. Other: None Musculoskeletal: Degenerative changes of spine. No acute osseous pathology. Review of the MIP images confirms the above findings. IMPRESSION: 1. No CT evidence of pulmonary artery embolus. 2. Right lower lobe pneumonia with small right pleural effusion. Follow-up to resolution recommended. 3. Background of  interstitial lung disease. 4. No acute intra-abdominal or pelvic pathology. 5. Colonic diverticulosis. No bowel obstruction. Normal appendix. 6.  Aortic Atherosclerosis (ICD10-I70.0). Electronically Signed   By: Vanetta Chou M.D.   On: 08/09/2024 13:55     Labs:   Basic Metabolic Panel: Recent Labs  Lab 08/09/24 0930 08/10/24 0401  NA 137 136  K 4.1 3.7  CL 98 99  CO2 28 26  GLUCOSE 105* 94  BUN 16 15  CREATININE 0.87 0.74  CALCIUM 9.0 8.3*   GFR Estimated Creatinine Clearance: 52.9 mL/min (by C-G formula based on SCr of 0.74 mg/dL). Liver Function Tests: Recent Labs  Lab 08/09/24 0930  AST 22  ALT 10  ALKPHOS 105  BILITOT 1.0  PROT 7.4  ALBUMIN 3.9   Recent Labs  Lab 08/09/24 0930  LIPASE 16   No results for input(s): AMMONIA in the last 168 hours. Coagulation profile No results for input(s): INR, PROTIME in the last 168 hours.  CBC: Recent Labs  Lab 08/09/24 0930 08/10/24 0401 08/11/24 0357  WBC 18.0* 14.7* 12.8*  NEUTROABS 15.2*  --  10.1*  HGB 12.8* 11.7* 11.2*  HCT 39.6 36.5* 34.3*  MCV 91.0 90.6 89.6  PLT 172 158 179   Cardiac Enzymes: No results for input(s): CKTOTAL, CKMB, CKMBINDEX, TROPONINI in the last 168 hours. BNP: Invalid input(s): POCBNP CBG: No results for input(s): GLUCAP in the last 168 hours. D-Dimer No results for input(s): DDIMER in the last 72 hours. Hgb A1c No results for input(s): HGBA1C in the last 72 hours. Lipid Profile No results for input(s): CHOL, HDL, LDLCALC, TRIG, CHOLHDL, LDLDIRECT in the last 72 hours. Thyroid  function studies No results for input(s): TSH, T4TOTAL, T3FREE, THYROIDAB in the last 72 hours.  Invalid input(s): FREET3 Anemia work up No results for input(s): VITAMINB12, FOLATE, FERRITIN, TIBC, IRON, RETICCTPCT in the last 72 hours. Microbiology Recent Results (from the past 240 hours)  Culture, blood (routine x 2) Call MD if unable to  obtain prior to antibiotics being given     Status: None (Preliminary result)   Collection Time: 08/09/24  6:55 PM   Specimen: BLOOD  Result Value Ref Range Status   Specimen Description BLOOD LEFT ANTECUBITAL  Final   Special Requests   Final    AEROBIC BOTTLE ONLY Blood Culture results may not be optimal due to an inadequate volume of blood received in culture bottles   Culture   Final    NO GROWTH 2 DAYS Performed at [facility], [address], [place], [place] [address]    Report Status PENDING  Incomplete  Culture, blood (routine x 2) Call MD if unable to obtain prior to antibiotics being given     Status: None (Preliminary result)   Collection Time: 08/09/24  6:55 PM   Specimen: BLOOD  Result Value Ref Range Status   Specimen Description BLOOD BLOOD LEFT WRIST  Final   Special Requests   Final    AEROBIC BOTTLE ONLY Blood Culture results may not be optimal due to an inadequate volume of blood received in culture bottles   Culture   Final    NO GROWTH 2 DAYS Performed at [facility], [address], [place], [place] [address]    Report Status PENDING  Incomplete  Resp panel by RT-PCR (RSV, Flu A&B, Covid) Anterior Nasal Swab     Status: None   Collection Time: 08/10/24  9:33 AM   Specimen: Anterior Nasal Swab  Result Value Ref Range Status   SARS Coronavirus 2 by RT PCR NEGATIVE NEGATIVE Final    Comment: (NOTE) SARS-CoV-2 target nucleic acids are NOT DETECTED.  The SARS-CoV-2 RNA is generally detectable in upper respiratory specimens during the acute phase of infection. The lowest concentration of SARS-CoV-2 viral copies this assay can detect  is 138 copies/mL. A negative result does not preclude SARS-Cov-2 infection and should not be used as the sole basis for treatment or other patient management decisions. A negative result may occur with  improper specimen collection/handling, submission of specimen other than nasopharyngeal swab, presence of viral mutation(s) within  the areas targeted by this assay, and inadequate number of viral copies(<138 copies/mL). A negative result must be combined with clinical observations, patient history, and epidemiological information. The expected result is Negative.  Fact Sheet for Patients:  bloggercourse.com  Fact Sheet for Healthcare Providers:  seriousbroker.it  This test is no t yet approved or cleared by the [place]  FDA and  has been authorized for detection and/or diagnosis of SARS-CoV-2 by FDA under an Emergency Use Authorization (EUA). This EUA will remain  in effect (meaning this test can be used) for the duration of the COVID-19 declaration under Section 564(b)(1) of the Act, 21 U.S.C.section 360bbb-3(b)(1), unless the authorization is terminated  or revoked sooner.       Influenza A by PCR NEGATIVE NEGATIVE Final   Influenza B by PCR NEGATIVE NEGATIVE Final    Comment: (NOTE) The Xpert Xpress SARS-CoV-2/FLU/RSV plus assay is intended as an aid in the diagnosis of influenza from Nasopharyngeal swab specimens and should not be used as a sole basis for treatment. Nasal washings and aspirates are unacceptable for Xpert Xpress SARS-CoV-2/FLU/RSV testing.  Fact Sheet for Patients: bloggercourse.com  Fact Sheet for Healthcare Providers: seriousbroker.it  This test is not yet approved or cleared by the [place]  FDA and has been authorized for detection and/or diagnosis of SARS-CoV-2 by FDA under an Emergency Use Authorization (EUA). This EUA will remain in effect (meaning this test can be used) for the duration of the COVID-19 declaration under Section 564(b)(1) of the Act, 21 U.S.C. section 360bbb-3(b)(1), unless the authorization is terminated or revoked.     Resp Syncytial Virus by PCR NEGATIVE NEGATIVE Final    Comment: (NOTE) Fact Sheet for  Patients: bloggercourse.com  Fact Sheet for Healthcare Providers: seriousbroker.it  This test is not yet approved or cleared by the [place]  FDA and has been authorized for detection and/or diagnosis of SARS-CoV-2 by FDA under an Emergency Use Authorization (EUA). This EUA will remain in effect (meaning this test can be used) for the duration of the COVID-19 declaration under Section 564(b)(1) of the Act, 21 U.S.C. section 360bbb-3(b)(1), unless the authorization is terminated or revoked.  Performed at [facility], [address] [place], [place] [address]      Discharge Instructions:   Discharge Instructions     Diet - low sodium heart healthy   Complete by: As directed    Discharge instructions   Complete by: As directed    Complete course of antibiotic.  Follow-up with your primary care provider in 1 week.  Seek medical attention for worsening symptoms.  Continue antibiotics from home for next 3 days to complete the course.  Start from tomorrow.   Increase activity slowly   Complete by: As directed       Allergies as of 08/11/2024   No Known Allergies      Medication List     TAKE these medications    albuterol  108 (90 Base) MCG/ACT inhaler Commonly known as: VENTOLIN  HFA Inhale 2 puffs into the lungs every 4 (four) hours as needed for wheezing.   amoxicillin -clavulanate 875-125 MG tablet Commonly known as: AUGMENTIN  Take 1 tablet by mouth 2 (two) times daily.   aspirin  81 MG  tablet Take 1 tablet (81 mg total) by mouth daily.   azithromycin  250 MG tablet Commonly known as: ZITHROMAX  Take 250 mg by mouth as directed.   Breyna  160-4.5 MCG/ACT inhaler Generic drug: budesonide -formoterol  INHALE 2 PUFFS BY MOUTH TWICE DAILY -  RINSE  MOUTH  WITH  WATER   AFTER  EACH  USE   cetirizine  10 MG tablet Commonly known as: ZyrTEC  Allergy Take 1 tablet (10 mg total) by mouth daily.   diltiazem  30 MG  tablet Commonly known as: CARDIZEM  Take 1 tablet (30 mg total) by mouth every 12 (twelve) hours.   fish oil-omega-3 fatty acids 1000 MG capsule Take 1 g by mouth daily.   guaiFENesin  600 MG 12 hr tablet Commonly known as: MUCINEX  Take 1 tablet (600 mg total) by mouth 2 (two) times daily for 10 days.   ketoconazole  2 % cream Commonly known as: NIZORAL  Apply topically 2 (two) times daily as needed for irritation.   meclizine  25 MG tablet Commonly known as: ANTIVERT  Take 1 tablet (25 mg total) by mouth 3 (three) times daily as needed for dizziness.   NYQUIL PO Take 15 mLs by mouth as needed (cold and cough).   pantoprazole  40 MG tablet Commonly known as: PROTONIX  TAKE 1 TABLET BY MOUTH ONCE DAILY 30 MINUTES BEFORE BREAKFAST   pravastatin  40 MG tablet Commonly known as: PRAVACHOL  Take 1 tablet (40 mg total) by mouth daily.   PRESERVISION AREDS 2 PO Take 1 capsule by mouth 2 (two) times daily.   vitamin C  1000 MG tablet Take 1,000 mg by mouth daily.   VITAMIN D3 PO Take 1 tablet by mouth daily.        Follow-up Information     Alphonsa Glendia LABOR, MD Follow up in 1 week(s).   Specialty: Family Medicine Contact information: [address] [place] [address] [phone]         [facility] Follow up.   Specialty: Home Health Services Why: They will call you for Home Health scheduling. Contact information: [address] [place] [address] [phone]                  Time coordinating discharge: 39 minutes  Signed:  Mehlani Blankenburg  [facility] 08/11/2024, 12:34 PM

## 2024-08-11 NOTE — TOC Transition Note (Signed)
 Transition of Care [facility]) - Discharge Note   Patient Details  Name: Jason Spence MRN: [id number] Date of Birth: Jun 14, 1941  [facility] of [facility]) CM/SW Contact:  Lorraine LILLETTE Fenton, LCSW Phone Number: 08/11/2024, 10:20 AM   Clinical Narrative:    [place] from RN -son has follow up but he missed a call from a [facility] contact yesterday.  CSW reviewed chart- pt not matched with a provider, called son.  Son states he spoke with pt who is in agreement with HHPT.  CSW offered Choice, son states they do not have a preference, CSW agreed to send out to a couple local agencies and request orders.   [place] to MD requesting orders.  [place] sent pt to [name] and [facility] for HHPT/OT offer, CSW will add provider to AVS if response received.  DC expected today.      Barriers to Discharge: No Barriers Identified   Patient Goals and CMS Choice            Discharge Placement                       Discharge Plan and Services Additional resources added to the After Visit Summary for                                       Social Drivers of Health (SDOH) Interventions SDOH Screenings   Food Insecurity: No Food Insecurity (08/09/2024)  Housing: Low Risk  (08/09/2024)  Transportation Needs: No Transportation Needs (08/09/2024)  Utilities: Not At Risk (08/09/2024)  Alcohol Screen: Low Risk  (08/24/2022)  Depression (PHQ2-9): Low Risk  (07/11/2024)  Recent Concern: Depression (PHQ2-9) - Medium Risk (06/14/2024)  Financial Resource Strain: Low Risk  (12/16/2023)  Physical Activity: Sufficiently Active (12/16/2023)  Social Connections: Moderately Integrated (08/09/2024)  Stress: No Stress Concern Present (12/16/2023)  Tobacco Use: Medium Risk (08/09/2024)  Health Literacy: Adequate Health Literacy (12/16/2023)     Readmission Risk Interventions    08/10/2024    3:56 PM  Readmission Risk Prevention Plan  Medication Screening Complete  Transportation Screening Complete

## 2024-08-12 LAB — LEGIONELLA PNEUMOPHILA SEROGP 1 UR AG: L. pneumophila Serogp 1 Ur Ag: NEGATIVE

## 2024-08-13 ENCOUNTER — Telehealth: Payer: Self-pay

## 2024-08-13 NOTE — Transitions of Care (Post Inpatient/ED Visit) (Signed)
 08/13/2024  Name: JOSEPHUS HARRIGER MRN: [id number] DOB: [birth year]  Today's TOC FU Call Status: Today's TOC FU Call Status:: Successful TOC FU Call Completed TOC FU Call Complete Date: 08/13/24 Patient's Name and Date of Birth confirmed.  Transition Care Management Follow-up Telephone Call Date of Discharge: 08/11/24 [facility] Type of Discharge: Inpatient Admission Primary Inpatient Discharge Diagnosis:: pneumonia How have you been since you were released from the hospital?: Better Any questions or concerns?: No  Items Reviewed: Did you receive and understand the discharge instructions provided?: Yes Medications obtained,verified, and reconciled?: Yes (Medications Reviewed) Any new allergies since your discharge?: No Dietary orders reviewed?: Yes Do you have support at home?: Yes People in Home [RPT]: other relative(s)  Medications Reviewed Today: Medications Reviewed Today     Reviewed by Emmitt Pan, LPN (Licensed Practical Nurse) on 08/13/24 at 1253  Med List Status: <None>   Medication Order Taking? Sig Documenting Provider Last Dose Status Informant  albuterol  (VENTOLIN  HFA) 108 (90 Base) MCG/ACT inhaler [id number] Yes Inhale 2 puffs into the lungs every 4 (four) hours as needed for wheezing. Alphonsa Glendia LABOR, MD  Active Self, Family Member, Pharmacy Records  amoxicillin -clavulanate (AUGMENTIN ) 875-125 MG tablet [id number] Yes Take 1 tablet by mouth 2 (two) times daily. [provider]  Active Self, Family Member, Pharmacy Records           Med Note [place], [place]   Thu Aug 09, 2024  1:40 PM) Pt has not started  Ascorbic Acid  (VITAMIN C ) 1000 MG tablet [id number] Yes Take 1,000 mg by mouth daily. [provider]  Active Self, Family Member, Pharmacy Records  aspirin  81 MG tablet [id number] Yes Take 1 tablet (81 mg total) by mouth daily. Golda Claudis PENNER, MD  Active Self, Family Member, Pharmacy Records  azithromycin   (ZITHROMAX ) 250 MG tablet [id number] Yes Take 250 mg by mouth as directed. [provider]  Active Self, Family Member, Pharmacy Records           Med Note [place]   Thu Aug 09, 2024  1:40 PM) Pt has not started  budesonide -formoterol  (BREYNA ) 160-4.5 MCG/ACT inhaler [id number]  INHALE 2 PUFFS BY MOUTH TWICE DAILY -  RINSE  MOUTH  WITH  WATER   AFTER  EACH  USE  Patient not taking: Reported on 08/13/2024   Alphonsa Glendia LABOR, MD  Active Self, Family Member, Pharmacy Records  cetirizine  (ZYRTEC  ALLERGY) 10 MG tablet [id number] Yes Take 1 tablet (10 mg total) by mouth daily. Stuart Vernell Norris, PA-C  Active Self, Family Member, Pharmacy Records  Cholecalciferol (VITAMIN D3 PO) [id number] Yes Take 1 tablet by mouth daily. [provider]  Active Self, Family Member, Pharmacy Records  diltiazem  (CARDIZEM ) 30 MG tablet [id number] Yes Take 1 tablet (30 mg total) by mouth every 12 (twelve) hours. Alphonsa Glendia LABOR, MD  Active Self, Family Member, Pharmacy Records  fish oil-omega-3 fatty acids 1000 MG capsule [id number] Yes Take 1 g by mouth daily.  [provider]  Active Self, Family Member, Pharmacy Records  guaiFENesin  (MUCINEX ) 600 MG 12 hr tablet [id number] Yes Take 1 tablet (600 mg total) by mouth 2 (two) times daily for 10 days. Pokhrel, [place], MD  Active   ketoconazole  (NIZORAL ) 2 % cream [id number] Yes Apply topically 2 (two) times daily as needed for irritation. Alphonsa Glendia LABOR, MD  Active Self, Family Member, Pharmacy Records  meclizine  (ANTIVERT ) 25 MG tablet [id number] Yes Take 1 tablet (25 mg total) by  mouth 3 (three) times daily as needed for dizziness. Tegeler, Lonni PARAS, MD  Active Self, Family Member, Pharmacy Records  Multiple Vitamins-Minerals (PRESERVISION AREDS 2 PO) [id number] Yes Take 1 capsule by mouth 2 (two) times daily. [provider]  Active Self, Family Member, Pharmacy Records  pantoprazole  (PROTONIX ) 40 MG tablet [id number] Yes TAKE 1  TABLET BY MOUTH ONCE DAILY 30 MINUTES BEFORE BREAKFAST [name], Glendia LABOR, MD  Active Self, Family Member, Pharmacy Records  pravastatin  (PRAVACHOL ) 40 MG tablet [id number] Yes Take 1 tablet (40 mg total) by mouth daily. Alphonsa Glendia LABOR, MD  Active Self, Family Member, Pharmacy Records  Pseudoeph-Doxylamine-DM-APAP (NYQUIL PO) [id number] Yes Take 15 mLs by mouth as needed (cold and cough). [provider]  Active Self, Family Member, Pharmacy Records            Home Care and Equipment/Supplies: Were Home Health Services Ordered?: Yes Name of Home Health Agency:: [facility] Has Agency set up a time to come to your home?: Yes First Home Health Visit Date: 08/15/24 Any new equipment or medical supplies ordered?: NA  Functional Questionnaire: Do you need assistance with bathing/showering or dressing?: Yes Do you need assistance with meal preparation?: Yes Do you need assistance with eating?: No Do you have difficulty maintaining continence: No Do you need assistance with getting out of bed/getting out of a chair/moving?: No Do you have difficulty managing or taking your medications?: Yes  Follow up appointments reviewed: PCP Follow-up appointment confirmed?: Yes Date of PCP follow-up appointment?: 08/14/24 Follow-up Provider: [facility] Follow-up appointment confirmed?: NA Do you need transportation to your follow-up appointment?: No Do you understand care options if your condition(s) worsen?: Yes-patient verbalized understanding    SIGNATURE Julian Lemmings, LPN [facility] Nurse Health Advisor Direct Dial [phone]

## 2024-08-14 ENCOUNTER — Ambulatory Visit (HOSPITAL_COMMUNITY)
Admission: RE | Admit: 2024-08-14 | Discharge: 2024-08-14 | Disposition: A | Discharge status: Home / Self Care | Source: Ambulatory Visit | Attending: Family Medicine | Admitting: Family Medicine

## 2024-08-14 ENCOUNTER — Ambulatory Visit: Payer: Self-pay | Admitting: Family Medicine

## 2024-08-14 ENCOUNTER — Encounter: Payer: Self-pay | Admitting: Family Medicine

## 2024-08-14 VITALS — BP 145/60 | HR 71 | Ht 67.0 in | Wt 125.0 lb

## 2024-08-14 DIAGNOSIS — M545 Low back pain, unspecified: Secondary | ICD-10-CM | POA: Insufficient documentation

## 2024-08-14 DIAGNOSIS — J189 Pneumonia, unspecified organism: Secondary | ICD-10-CM

## 2024-08-14 LAB — CULTURE, BLOOD (ROUTINE X 2)
Culture: NO GROWTH
Culture: NO GROWTH

## 2024-08-14 NOTE — Patient Instructions (Signed)
 Follow up in 4-6 weeks.  Finish the antibiotics.  Xray today.

## 2024-08-14 NOTE — Assessment & Plan Note (Signed)
 Recent labs, imaging results, discharge summary reviewed. Finish course of Azithromycin  and Augmentin .  Follow up 4-6 weeks. Xray at that time to ensure resolution.

## 2024-08-14 NOTE — Progress Notes (Signed)
 Subjective:  Patient ID: Jason Spence, male    DOB: [birth year]  Age: 83 y.o. MRN: [id number]  CC:   Chief Complaint  Patient presents with   Hospitalization Follow-up    HPI: 83 year old male presents for hospital follow up.  Recently admitted from 10/23 to 10/25. Hospital course, labs, imaging, discharge summary reviewed.   In summary: Seen at [facility] and diagnosed with Pneumonia.  Developed Right flank pain and went to the ER. Pneumonia confirmed and he was subsequently admitted. Treated with IV Antibiotics. Did well during admission. No oxygen requirement. Discharged home on Augmentin  and Azithromycin .  Presents today for follow up. Feeling fatigued and still having cough. Had a recent fall while going to the bathroom Sunday night. Injured right elbow, and right hip. Is having low back pain as well. No fever. No other complaints at this time.    Patient Active Problem List   Diagnosis Date Noted   Acute bilateral low back pain without sciatica 08/14/2024   Pneumonia 08/09/2024   Epidermal cyst 06/18/2024   PAC (premature atrial contraction) 01/23/2024   Palpitations 09/02/2023   S/P CABG (coronary artery bypass graft) 09/02/2023   Interstitial lung disease with progressive fibrotic phenotype in diseases classified elsewhere (HCC) 02/24/2023   Iron deficiency anemia 07/31/2022   Arthritis of carpometacarpal (CMC) joint of both thumbs 05/18/2022   CAD (coronary artery disease) 04/13/2022   Loss of weight 04/13/2022   Pulmonary fibrosis (HCC) 12/31/2021   Legally blind 08/08/2020   Macular degeneration of both eyes 01/27/2015   Fibroma 09/25/2014   GERD (gastroesophageal reflux disease) 01/10/2014   Hyperlipidemia 01/10/2014   Osteoarthritis of left knee 01/10/2014   Essential hypertension, benign 03/15/2013    Social Hx   Social History   Socioeconomic History   Marital status: Widowed    Spouse name: Not on file   Number of children: Not on file   Years of  education: Not on file   Highest education level: Not on file  Occupational History   Not on file  Tobacco Use   Smoking status: Former    Current packs/day: 0.00    Types: Cigarettes    Quit date: 08/04/1995    Years since quitting: 29.0    Passive exposure: Past   Smokeless tobacco: Never  Vaping Use   Vaping status: Never Used  Substance and Sexual Activity   Alcohol use: Not Currently    Comment: rare   Drug use: Never   Sexual activity: Yes    Birth control/protection: None  Other Topics Concern   Not on file  Social History Narrative   ** Merged History Encounter **       12/16/23 lives with son and daughter in social worker. He lives in the basement.    Social Drivers of Corporate Investment Banker Strain: Low Risk  (12/16/2023)   Overall Financial Resource Strain (CARDIA)    Difficulty of Paying Living Expenses: Not hard at all  Food Insecurity: No Food Insecurity (08/09/2024)   Hunger Vital Sign    Worried About Running Out of Food in the Last Year: Never true    Ran Out of Food in the Last Year: Never true  Transportation Needs: No Transportation Needs (08/09/2024)   PRAPARE - Administrator, Civil Service (Medical): No    Lack of Transportation (Non-Medical): No  Physical Activity: Sufficiently Active (12/16/2023)   Exercise Vital Sign    Days of Exercise per Week: 7 days  Minutes of Exercise per Session: 30 min  Stress: No Stress Concern Present (12/16/2023)   [employer] of Occupational Health - Occupational Stress Questionnaire    Feeling of Stress : Not at all  Social Connections: Moderately Integrated (08/09/2024)   Social Connection and Isolation Panel    Frequency of Communication with Friends and Family: More than three times a week    Frequency of Social Gatherings with Friends and Family: More than three times a week    Attends Religious Services: More than 4 times per year    Active Member of [employer] or Organizations: Yes    Attends Occupational Hygienist Meetings: More than 4 times per year    Marital Status: Widowed    Review of Systems Per HPI  Objective:  BP (!) 145/60   Pulse 71   Ht 5' 7 (1.702 m)   Wt 125 lb (56.7 kg)   SpO2 90%   BMI 19.58 kg/m      08/14/2024    2:43 PM 08/11/2024    4:34 AM 08/10/2024    8:08 PM  BP/Weight  Systolic BP 145 145 151  Diastolic BP 60 72 71  Wt. (Lbs) 125    BMI 19.58 kg/m2      Physical Exam Vitals and nursing note reviewed.  Constitutional:      General: He is not in acute distress.    Appearance: Normal appearance.  Eyes:     General:        Right eye: No discharge.        Left eye: No discharge.     Conjunctiva/sclera: Conjunctivae normal.  Cardiovascular:     Rate and Rhythm: Normal rate and regular rhythm.  Pulmonary:     Effort: Pulmonary effort is normal.     Breath sounds: Rales present.  Neurological:     Mental Status: He is alert.     Lab Results  Component Value Date   WBC 12.8 (H) 08/11/2024   HGB 11.2 (L) 08/11/2024   HCT 34.3 (L) 08/11/2024   PLT 179 08/11/2024   GLUCOSE 94 08/10/2024   CHOL 111 07/22/2023   TRIG 88 07/22/2023   HDL 36 (L) 07/22/2023   LDLCALC 58 07/22/2023   ALT 10 08/09/2024   AST 22 08/09/2024   NA 136 08/10/2024   K 3.7 08/10/2024   CL 99 08/10/2024   CREATININE 0.74 08/10/2024   BUN 15 08/10/2024   CO2 26 08/10/2024   TSH 1.100 03/20/2024   PSA 3.11 07/08/2014   INR 1.3 (H) 11/14/2023   HGBA1C 5.9 (H) 04/13/2022     Assessment & Plan:  Pneumonia of right lower lobe due to infectious organism Assessment & Plan: Recent labs, imaging results, discharge summary reviewed. Finish course of Azithromycin  and Augmentin .  Follow up 4-6 weeks. Xray at that time to ensure resolution.   Orders: -     DG Chest 2 View  Acute bilateral low back pain without sciatica Assessment & Plan: Recent fall. Xray today for evaluation.  Orders: -     DG Lumbar Spine Complete    Follow-up:  4-6 weeks  Jersie Beel  Bluford DO [facility]

## 2024-08-14 NOTE — Assessment & Plan Note (Signed)
 Recent fall. Xray today for evaluation.

## 2024-08-21 DIAGNOSIS — L570 Actinic keratosis: Secondary | ICD-10-CM | POA: Diagnosis not present

## 2024-08-21 DIAGNOSIS — D225 Melanocytic nevi of trunk: Secondary | ICD-10-CM | POA: Diagnosis not present

## 2024-08-21 DIAGNOSIS — X32XXXD Exposure to sunlight, subsequent encounter: Secondary | ICD-10-CM | POA: Diagnosis not present

## 2024-08-22 ENCOUNTER — Encounter (INDEPENDENT_AMBULATORY_CARE_PROVIDER_SITE_OTHER): Payer: Self-pay | Admitting: Gastroenterology

## 2024-09-06 ENCOUNTER — Ambulatory Visit (INDEPENDENT_AMBULATORY_CARE_PROVIDER_SITE_OTHER): Payer: Self-pay | Admitting: Family Medicine

## 2024-09-06 ENCOUNTER — Encounter: Payer: Self-pay | Admitting: Family Medicine

## 2024-09-06 ENCOUNTER — Ambulatory Visit (HOSPITAL_COMMUNITY)
Admission: RE | Admit: 2024-09-06 | Discharge: 2024-09-06 | Disposition: A | Discharge status: Home / Self Care | Source: Ambulatory Visit | Attending: Family Medicine | Admitting: Family Medicine

## 2024-09-06 VITALS — BP 153/61 | HR 76 | Temp 98.4°F | Ht 67.0 in | Wt 118.0 lb

## 2024-09-06 DIAGNOSIS — I771 Stricture of artery: Secondary | ICD-10-CM | POA: Diagnosis not present

## 2024-09-06 DIAGNOSIS — I700 Atherosclerosis of aorta: Secondary | ICD-10-CM | POA: Diagnosis not present

## 2024-09-06 DIAGNOSIS — J189 Pneumonia, unspecified organism: Secondary | ICD-10-CM | POA: Diagnosis not present

## 2024-09-06 DIAGNOSIS — R051 Acute cough: Secondary | ICD-10-CM | POA: Diagnosis not present

## 2024-09-06 DIAGNOSIS — J8410 Pulmonary fibrosis, unspecified: Secondary | ICD-10-CM | POA: Diagnosis not present

## 2024-09-06 NOTE — Patient Instructions (Signed)
Chest xray today.  I will call with results.  Take care  Dr. Adriana Simas

## 2024-09-07 NOTE — Telephone Encounter (Signed)
 Copied from CRM #[id number]. Topic: Clinical - Lab/Test Results >> Sep 07, 2024 12:35 PM Ivette P wrote: Reason for CRM: Pt was sent to ER last night by provider cook and was told he would receive a call.   Pt never received call and would ike to know the outcome and if someone could follow up before end of day today  Pt and daughter is worred, pt daughter April will callback

## 2024-09-09 DIAGNOSIS — R051 Acute cough: Secondary | ICD-10-CM | POA: Insufficient documentation

## 2024-09-09 NOTE — Assessment & Plan Note (Signed)
 Chest xray negative for pneumonia. Mucinex  as needed.

## 2024-09-09 NOTE — Progress Notes (Signed)
 Subjective:  Patient ID: Jason Spence, male    DOB: May 24, 1941  Age: 83 y.o. MRN: [id number]  CC:   Chief Complaint  Patient presents with   pneumonia 4 weeks ago    Started coughing on Sunday - cough productive  throat clearing and coughing this morning  Hx of pulm fibrosis - pt concerned for reocurring pneumonia    HPI:  83 year old male presents for evaluation of the above.  Patient reports that he has had a productive cough since Sunday.  Recent hospitalization for pneumonia. Has pulmonary fibrosis. He has had a lot of postnasal drip. No fever. No relieving factors.   Patient Active Problem List   Diagnosis Date Noted   Acute cough 09/09/2024   Acute bilateral low back pain without sciatica 08/14/2024   Pneumonia 08/09/2024   Epidermal cyst 06/18/2024   PAC (premature atrial contraction) 01/23/2024   Palpitations 09/02/2023   S/P CABG (coronary artery bypass graft) 09/02/2023   Interstitial lung disease with progressive fibrotic phenotype in diseases classified elsewhere (HCC) 02/24/2023   Iron deficiency anemia 07/31/2022   Arthritis of carpometacarpal (CMC) joint of both thumbs 05/18/2022   CAD (coronary artery disease) 04/13/2022   Loss of weight 04/13/2022   Pulmonary fibrosis (HCC) 12/31/2021   Legally blind 08/08/2020   Macular degeneration of both eyes 01/27/2015   Fibroma 09/25/2014   GERD (gastroesophageal reflux disease) 01/10/2014   Hyperlipidemia 01/10/2014   Osteoarthritis of left knee 01/10/2014   Essential hypertension, benign 03/15/2013    Social Hx   Social History   Socioeconomic History   Marital status: Widowed    Spouse name: Not on file   Number of children: Not on file   Years of education: Not on file   Highest education level: Not on file  Occupational History   Not on file  Tobacco Use   Smoking status: Former    Current packs/day: 0.00    Types: Cigarettes    Quit date: 08/04/1995    Years since quitting: 29.1    Passive  exposure: Past   Smokeless tobacco: Never  Vaping Use   Vaping status: Never Used  Substance and Sexual Activity   Alcohol use: Not Currently    Comment: rare   Drug use: Never   Sexual activity: Yes    Birth control/protection: None  Other Topics Concern   Not on file  Social History Narrative   ** Merged History Encounter **       12/16/23 lives with son and daughter in social worker. He lives in the basement.    Social Drivers of Corporate Investment Banker Strain: Low Risk  (12/16/2023)   Overall Financial Resource Strain (CARDIA)    Difficulty of Paying Living Expenses: Not hard at all  Food Insecurity: No Food Insecurity (08/09/2024)   Hunger Vital Sign    Worried About Running Out of Food in the Last Year: Never true    Ran Out of Food in the Last Year: Never true  Transportation Needs: No Transportation Needs (08/09/2024)   PRAPARE - Administrator, Civil Service (Medical): No    Lack of Transportation (Non-Medical): No  Physical Activity: Sufficiently Active (12/16/2023)   Exercise Vital Sign    Days of Exercise per Week: 7 days    Minutes of Exercise per Session: 30 min  Stress: No Stress Concern Present (12/16/2023)   [employer] of Occupational Health - Occupational Stress Questionnaire    Feeling of Stress :  Not at all  Social Connections: Moderately Integrated (08/09/2024)   Social Connection and Isolation Panel    Frequency of Communication with Friends and Family: More than three times a week    Frequency of Social Gatherings with Friends and Family: More than three times a week    Attends Religious Services: More than 4 times per year    Active Member of [employer] or Organizations: Yes    Attends Banker Meetings: More than 4 times per year    Marital Status: Widowed    Review of Systems Per HPI  Objective:  BP (!) 153/61   Pulse 76   Temp 98.4 F (36.9 C)   Ht 5' 7 (1.702 m)   Wt 118 lb (53.5 kg)   SpO2 97%   BMI 18.48 kg/m       09/06/2024    2:53 PM 08/14/2024    2:43 PM 08/11/2024    4:34 AM  BP/Weight  Systolic BP 153 145 145  Diastolic BP 61 60 72  Wt. (Lbs) 118 125   BMI 18.48 kg/m2 19.58 kg/m2     Physical Exam Vitals and nursing note reviewed.  Constitutional:      General: He is not in acute distress.    Appearance: Normal appearance.  HENT:     Head: Normocephalic and atraumatic.  Cardiovascular:     Rate and Rhythm: Normal rate and regular rhythm.  Pulmonary:     Effort: Pulmonary effort is normal. No respiratory distress.     Breath sounds: Rales present.  Neurological:     Mental Status: He is alert.     Lab Results  Component Value Date   WBC 12.8 (H) 08/11/2024   HGB 11.2 (L) 08/11/2024   HCT 34.3 (L) 08/11/2024   PLT 179 08/11/2024   GLUCOSE 94 08/10/2024   CHOL 111 07/22/2023   TRIG 88 07/22/2023   HDL 36 (L) 07/22/2023   LDLCALC 58 07/22/2023   ALT 10 08/09/2024   AST 22 08/09/2024   NA 136 08/10/2024   K 3.7 08/10/2024   CL 99 08/10/2024   CREATININE 0.74 08/10/2024   BUN 15 08/10/2024   CO2 26 08/10/2024   TSH 1.100 03/20/2024   PSA 3.11 07/08/2014   INR 1.3 (H) 11/14/2023   HGBA1C 5.9 (H) 04/13/2022     Assessment & Plan:  Acute cough Assessment & Plan: Chest xray negative for pneumonia. Mucinex  as needed.  Orders: -     DG Chest 2 View    Follow-up:  Return if symptoms worsen or fail to improve.  Jacqulyn Ahle DO [facility]

## 2024-09-10 ENCOUNTER — Ambulatory Visit: Payer: Self-pay | Admitting: Family Medicine

## 2024-09-17 ENCOUNTER — Encounter: Payer: Self-pay | Admitting: Family Medicine

## 2024-09-17 ENCOUNTER — Ambulatory Visit: Admitting: Family Medicine

## 2024-09-17 VITALS — BP 138/53 | HR 56 | Temp 98.1°F | Ht 67.0 in | Wt 119.0 lb

## 2024-09-17 DIAGNOSIS — R051 Acute cough: Secondary | ICD-10-CM | POA: Diagnosis not present

## 2024-09-17 NOTE — Progress Notes (Signed)
 Subjective:  Patient ID: Jason Spence, male    DOB: 1940-11-13  Age: 83 y.o. MRN: [id number]  CC:   Chief Complaint  Patient presents with   Follow-up    No concerns today    HPI:  83 year old male presents for follow up.  Cough has improved.  Chest x-ray was negative.  Patient states that overall he is doing well.  He still has some cough and congestion but states that it is overall improved.  He is using Mucinex  at night.  Patient Active Problem List   Diagnosis Date Noted   Acute cough 09/09/2024   PAC (premature atrial contraction) 01/23/2024   Palpitations 09/02/2023   S/P CABG (coronary artery bypass graft) 09/02/2023   Interstitial lung disease with progressive fibrotic phenotype in diseases classified elsewhere (HCC) 02/24/2023   Iron deficiency anemia 07/31/2022   Arthritis of carpometacarpal (CMC) joint of both thumbs 05/18/2022   CAD (coronary artery disease) 04/13/2022   Pulmonary fibrosis (HCC) 12/31/2021   Legally blind 08/08/2020   Macular degeneration of both eyes 01/27/2015   Fibroma 09/25/2014   GERD (gastroesophageal reflux disease) 01/10/2014   Hyperlipidemia 01/10/2014   Osteoarthritis of left knee 01/10/2014   Essential hypertension, benign 03/15/2013    Social Hx   Social History   Socioeconomic History   Marital status: Widowed    Spouse name: Not on file   Number of children: Not on file   Years of education: Not on file   Highest education level: Not on file  Occupational History   Not on file  Tobacco Use   Smoking status: Former    Current packs/day: 0.00    Types: Cigarettes    Quit date: 08/04/1995    Years since quitting: 29.1    Passive exposure: Past   Smokeless tobacco: Never  Vaping Use   Vaping status: Never Used  Substance and Sexual Activity   Alcohol use: Not Currently    Comment: rare   Drug use: Never   Sexual activity: Yes    Birth control/protection: None  Other Topics Concern   Not on file  Social  History Narrative   ** Merged History Encounter **       12/16/23 lives with son and daughter in social worker. He lives in the basement.    Social Drivers of Corporate Investment Banker Strain: Low Risk  (12/16/2023)   Overall Financial Resource Strain (CARDIA)    Difficulty of Paying Living Expenses: Not hard at all  Food Insecurity: No Food Insecurity (08/09/2024)   Hunger Vital Sign    Worried About Running Out of Food in the Last Year: Never true    Ran Out of Food in the Last Year: Never true  Transportation Needs: No Transportation Needs (08/09/2024)   PRAPARE - Administrator, Civil Service (Medical): No    Lack of Transportation (Non-Medical): No  Physical Activity: Sufficiently Active (12/16/2023)   Exercise Vital Sign    Days of Exercise per Week: 7 days    Minutes of Exercise per Session: 30 min  Stress: No Stress Concern Present (12/16/2023)   [employer] of Occupational Health - Occupational Stress Questionnaire    Feeling of Stress : Not at all  Social Connections: Moderately Integrated (08/09/2024)   Social Connection and Isolation Panel    Frequency of Communication with Friends and Family: More than three times a week    Frequency of Social Gatherings with Friends and Family: More than three  times a week    Attends Religious Services: More than 4 times per year    Active Member of Clubs or Organizations: Yes    Attends Banker Meetings: More than 4 times per year    Marital Status: Widowed    Review of Systems Per HPI  Objective:  BP (!) 138/53   Pulse (!) 56   Temp 98.1 F (36.7 C)   Ht 5' 7 (1.702 m)   Wt 119 lb (54 kg)   SpO2 100%   BMI 18.64 kg/m      09/17/2024    1:06 PM 09/06/2024    2:53 PM 08/14/2024    2:43 PM  BP/Weight  Systolic BP 138 153 145  Diastolic BP 53 61 60  Wt. (Lbs) 119 118 125  BMI 18.64 kg/m2 18.48 kg/m2 19.58 kg/m2    Physical Exam Vitals and nursing note reviewed.  Constitutional:       General: He is not in acute distress.    Appearance: Normal appearance.  Cardiovascular:     Rate and Rhythm: Normal rate and regular rhythm.  Pulmonary:     Effort: Pulmonary effort is normal.     Breath sounds: Rales present.  Neurological:     Mental Status: He is alert.     Lab Results  Component Value Date   WBC 12.8 (H) 08/11/2024   HGB 11.2 (L) 08/11/2024   HCT 34.3 (L) 08/11/2024   PLT 179 08/11/2024   GLUCOSE 94 08/10/2024   CHOL 111 07/22/2023   TRIG 88 07/22/2023   HDL 36 (L) 07/22/2023   LDLCALC 58 07/22/2023   ALT 10 08/09/2024   AST 22 08/09/2024   NA 136 08/10/2024   K 3.7 08/10/2024   CL 99 08/10/2024   CREATININE 0.74 08/10/2024   BUN 15 08/10/2024   CO2 26 08/10/2024   TSH 1.100 03/20/2024   PSA 3.11 07/08/2014   INR 1.3 (H) 11/14/2023   HGBA1C 5.9 (H) 04/13/2022     Assessment & Plan:  Acute cough Assessment & Plan: Resolving/improving.  Mucinex  as needed.     Follow-up: Has follow-up with Dr. Glendia Jacqulyn Ahle DO [facility]

## 2024-09-17 NOTE — Assessment & Plan Note (Signed)
 Resolving/improving.  Mucinex  as needed.

## 2024-09-17 NOTE — Patient Instructions (Addendum)
 Follow up with Dr. Glendia.  Take care  Dr. Bluford

## 2024-09-27 DIAGNOSIS — B351 Tinea unguium: Secondary | ICD-10-CM | POA: Diagnosis not present

## 2024-09-27 DIAGNOSIS — M79674 Pain in right toe(s): Secondary | ICD-10-CM | POA: Diagnosis not present

## 2024-12-21 ENCOUNTER — Ambulatory Visit: Payer: Medicare HMO

## 2025-01-08 ENCOUNTER — Ambulatory Visit (HOSPITAL_BASED_OUTPATIENT_CLINIC_OR_DEPARTMENT_OTHER): Admitting: Family Medicine

## 2025-01-08 ENCOUNTER — Ambulatory Visit: Admitting: Family Medicine
